# Patient Record
Sex: Female | Born: 1985 | Race: Black or African American | Hispanic: No | Marital: Single | Smoking: Never smoker
Health system: Southern US, Community
[De-identification: ages and names within clinical notes are randomized; demographics above are authoritative.]

## PROBLEM LIST (undated history)

## (undated) ENCOUNTER — Inpatient Hospital Stay (HOSPITAL_COMMUNITY): Payer: Self-pay

## (undated) DIAGNOSIS — I10 Essential (primary) hypertension: Secondary | ICD-10-CM

## (undated) DIAGNOSIS — K429 Umbilical hernia without obstruction or gangrene: Secondary | ICD-10-CM

## (undated) DIAGNOSIS — O09219 Supervision of pregnancy with history of pre-term labor, unspecified trimester: Secondary | ICD-10-CM

## (undated) HISTORY — PX: HERNIA REPAIR: SHX51

## (undated) HISTORY — DX: Supervision of pregnancy with history of pre-term labor, unspecified trimester: O09.219

---
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
## 2003-05-21 ENCOUNTER — Emergency Department (HOSPITAL_COMMUNITY): Admission: EM | Admit: 2003-05-21 | Discharge: 2003-05-21 | Payer: Self-pay | Admitting: *Deleted

## 2005-05-26 ENCOUNTER — Ambulatory Visit: Payer: Self-pay | Admitting: Obstetrics & Gynecology

## 2005-09-21 ENCOUNTER — Ambulatory Visit (HOSPITAL_BASED_OUTPATIENT_CLINIC_OR_DEPARTMENT_OTHER): Admission: RE | Admit: 2005-09-21 | Discharge: 2005-09-21 | Payer: Self-pay | Admitting: General Surgery

## 2006-08-30 ENCOUNTER — Emergency Department (HOSPITAL_COMMUNITY): Admission: EM | Admit: 2006-08-30 | Discharge: 2006-08-30 | Payer: Self-pay | Admitting: Family Medicine

## 2007-07-01 ENCOUNTER — Emergency Department (HOSPITAL_COMMUNITY): Admission: EM | Admit: 2007-07-01 | Discharge: 2007-07-01 | Payer: Self-pay | Admitting: Emergency Medicine

## 2009-10-10 ENCOUNTER — Emergency Department (HOSPITAL_COMMUNITY): Admission: EM | Admit: 2009-10-10 | Discharge: 2009-10-10 | Payer: Self-pay | Admitting: Emergency Medicine

## 2010-09-26 NOTE — Op Note (Signed)
[name], Krystal Chan                 ACCOUNT NO.:  [id number]   MEDICAL RECORD NO.:  [id number]          PATIENT TYPE:  AMB   LOCATION:  [facility]                         FACILITY:  [facility]   PHYSICIAN:  Leonie Man, M.D.   DATE OF BIRTH:  April 17, 1986   DATE OF PROCEDURE:  09/21/2005  DATE OF DISCHARGE:                                 OPERATIVE REPORT   PREOPERATIVE DIAGNOSIS:  Epigastric hernia.   POSTOPERATIVE DIAGNOSIS:  Epigastric diastasis.   PROCEDURE:  Imbrication of epigastric diastasis.   SURGEON:  Leonie Man, M.D.   ASSISTANT:  OR tech.   ANESTHESIA:  General.   SPECIMENS:  None.   ESTIMATED BLOOD LOSS:  Minimal.   COMPLICATIONS:  There were no complication.   DISPOSITION:  The patient returned to the PACU in excellent condition.   NOTE:  [name] is a 25 year old female with a bulging mass just superior to  the umbilicus.  The patient had undergone previous umbilical hernia repair  in the remote past.  She comes now with what is felt to be an epigastric  hernia for repair of same.  The risks and potential benefits of surgery have  been discussed, all questions answered and consent obtained.   PROCEDURE:  The patient was positioned supinely and following the induction  of satisfactory general anesthesia, the abdomen was prepped and draped to be  included in a sterile operative field.  A midline incision was carried down  over the region of the epigastrium just superior to the umbilicus in which  the bulging mass is felt.  This is deepened through skin and subcutaneous  tissues down to anterior midline fascia.  The fascia was very attenuated,  but there was no definite hernial defect coming through the entire thickness  of the fascia.  This defect was then imbricated with interrupted sutures of  0 Novofil, bringing the fascia and the rectus muscles together.  At the end  of this, the epigastric area was felt to be firm.  Sponge and instrument  counts were verified.   The subcutaneous tissues  were closed with a running 2-0 Vicryl suture, skin closed with a running 4-0  Monocryl suture and then reinforced with Steri-Strips, sterile dressings  applied, the anesthetic reversed and the patient removed from the operating  room to the recovery room in stable condition.  She tolerated the procedure  well.      Leonie Man, M.D.  Electronically Signed     PB/MEDQ  D:  09/21/2005  T:  09/22/2005  Job:  [id number]

## 2010-09-26 NOTE — Op Note (Signed)
NAME:  Krystal Chan, Krystal Chan                           ACCOUNT NO.:  [id number]   MEDICAL RECORD NO.:  [id number]                   PATIENT TYPE:  OIB   LOCATION:  NA                                   FACILITY:  [facility]   PHYSICIAN:  Leonie Man, M.D.                DATE OF BIRTH:  1985/08/16   DATE OF PROCEDURE:  11/09/2002  DATE OF DISCHARGE:                                 OPERATIVE REPORT   PREOPERATIVE DIAGNOSIS:  Umbilical hernia.   POSTOPERATIVE DIAGNOSIS:  Umbilical hernia.   OPERATION PERFORMED:  Repair of umbilical hernia.   SURGEON:  Leonie Man, M.D.   ASSISTANT:  Nurse.   ANESTHESIA:  General.   INDICATIONS FOR PROCEDURE:  The patient is an 25 year old obese female  recently postpartum who presents with a significant diastasis and at the  umbilicus has a very significant umbilical hernia.  The patient comes to the  operating room for repair of her umbilical hernia.  She understands that her  diastasis will not be changed by the repair of her hernia.  The risks and  benefits of umbilical hernia repair have been fully discussed, all questions  answered and consent obtained.   DESCRIPTION OF PROCEDURE:  Following the induction of satisfactory general  anesthesia, the patient was positioned supinely and the abdomen was prepped  and draped to include the umbilicus in the sterile operative field.  The  infraumbilical region was infiltrated with 0.5% Marcaine with epinephrine.  Curvilinear incision on the inferior border of the umbilicus carried down  through the skin and subcutaneous tissue raising the umbilicus as a flap and  dissecting the underlying hernia sac from off of the umbilical skin.  The  sac was dissected, carried down to the fascia.  The fascia in the midline  was extremely lax, consistent with the patient's severe diastasis.  The  hernial sac was dissected free from the fascia and the region of the  falciform ligament attached to the hernia sac was secured  with clamps and  sutures of 2-0 silk.  Because of the laxity of the abdomen, there was no  tension on the abdominal wall.  I chose to do this by primary repair using a  running 2-0 Novofil suture easily bringing the edge of the wound together in  a running suture.  the subcutaneous tissues were then closed after the  sponge, instrument and sharp counts were then verified.  This was  accomplished with a running 2-0 Vicryl suture.  The umbilical skin was then  tacked down to the base of the wound with a single 2-0 Vicryl stitch.  The  skin was then closed with running 4-0 Monocryl.  Steri-Strips were applied.  Anesthetic was reversed and the patient removed from the operating room to  the recovery room in stable condition, having tolerated the procedure well.  Leonie Man, M.D.    PB/MEDQ  D:  11/09/2002  T:  11/09/2002  Job:  [id number]

## 2010-09-26 NOTE — Discharge Summary (Signed)
[facility]  Patient:    Krystal Chan, Krystal Chan                        MRN: [id number] Adm. Date:  [id number] Disc. Date: [id number] Attending:  Antionette Char Dictator:   Solon Palm, D.O.                           Discharge Summary  HISTORY:                      The patient is a 25 year old, G1, P0, at 33-3/7 weeks admitted for monitoring and nonreassuring strip initially.  OBSTETRIC HISTORY:            Late prenatal care.  SOCIAL HISTORY:               No alcohol, no smoking, and denies substance abuse.  GYNECOLOGIC HISTORY:         Positive condyloma treated with Aldara cream. ASCUS Pap, October 2001.  PAST MEDICAL HISTORY:         None.  PAST SURGICAL HISTORY:        None.  MEDICATIONS:                  Prenatal vitamins x 1.  PRENATAL HISTORY:             GBS negative October 22. GC negative October 22. Chlamydia negative October 22. HBsAg negative. HIV negative. One-hour glucose 70. Hemoglobin/hematocrit ______ . Sickle cell negative. VDRL negative. Blood type is A positive. Rubella is immune.  INITIAL EXAMINATION:  VITAL SIGNS:                  Temperature was 99, blood pressure 111/62, pulse 98, fetal heart rate 150-155, no accelerations noted, no decelerations noted, occasional uterine contractions.  HEENT:                        Within normal limits.  CHEST:                        Clear.  CARDIOVASCULAR:               Regular rate and rhythm.  ABDOMEN:                      Soft, gravid.  EXTREMITIES:                  Pulses +2. No edema, no clonus noted.  ULTRASOUND:                   Ultrasound dated April 16, 2000: AFI was noted to be 12.8, BPP 8/8.  HOSPITAL COURSE:              The patient was admitted for continuous monitoring for a nonreassuring strip. Tocolysis was held. The patient was initially on oxygen. Fetal monitoring revealed no further worrisome fetal monitoring strips. Oxygen was weaned successfully. She  was continued on bed rest for the majority of her admission. On December 31 the patient was noted to have a normal AFI and a negative CST. Biweekly fetal monitoring was set up for the patient, to continue latent labor at home. She was discharged in stable condition and given the signs and symptoms of labor for which to return.  MEDICATIONS AT DISCHARGE:  Unchanged.  DISCHARGE FOLLOWUP:           Appointment was scheduled. DD:  07/18/00 TD:  07/18/00 Job: [id number]

## 2010-09-26 NOTE — Group Therapy Note (Signed)
NAMEJUNKO, OHAGAN NO.:  [id number]   MEDICAL RECORD NO.:  [id number]          PATIENT TYPE:  WOC   LOCATION:  [facility]                   FACILITY:  [facility]   PHYSICIAN:  Kathlyn Sacramento, M.D.   DATE OF BIRTH:  July 30, 1985   DATE OF SERVICE:                                    CLINIC NOTE   CHIEF COMPLAINT:  Nodule on cervix.   HISTORY OF PRESENT ILLNESS:  The patient is a 25 year old African-American  female, who is referred from [facility] with a nodule on her cervix.  The patient's last Pap smear done in July was normal and did have an  endocervical component present.  The patient denies any pain, or problems  otherwise.   PAST MEDICAL HISTORY:  None.   SOCIAL HISTORY:  Ethanol every other week.  One caffeinated beverage a day.  No tobacco or drug use.   MENSTRUAL HISTORY:  Age menstrual cycle began 30.  Menstrual cycles are  regular, no pain with periods.   CONTRACEPTIVE HISTORY:  Depo.   OBSTETRICAL HISTORY:  Two pregnancies, two living children, no miscarriages,  terminations, or ectopics.  Last pregnancy three years ago.  No C-sections.   GYNECOLOGIC HISTORY:  Last Pap smear was in 2006.  No mammograms.   SURGICAL HISTORY:  None.   FAMILY HISTORY:  Diabetes in paternal grandmother.  High blood pressure in  paternal grandmother.   ALLERGIES:  NONE.   MEDICATIONS:  None.   PHYSICAL EXAMINATION:  VITAL SIGNS: Temp 97.5, pulse 85, blood pressure  135/78, weight 185.7, height 5'1.  GENERAL:  Well-developed, well-nourished female in no acute distress.  GENITOURINARY EXAMINATION:  Normal external genitalia.  Vaginal mucosa is  pink and rugated.  Cervix is parous and cervical os is irregular in contour.  Otherwise, cervix in normal appearance.   IMPRESSION:  Previous cervical tear.   PLAN:  1.  The abnormal contour of the patient's cervical os is most likely due to      a previous cervical tear, which healed in an irregular fashion.  The    patient was seen by Dr. Okey Dupre, as well.  He agrees with impression and      plan.           ______________________________  Kathlyn Sacramento, M.D.     AC/MEDQ  D:  05/26/2005  T:  05/26/2005  Job:  [id number]

## 2010-09-26 NOTE — Discharge Summary (Signed)
NAME:  Krystal Chan, Krystal Chan                           ACCOUNT NO.:  [id number]   MEDICAL RECORD NO.:  [id number]                   PATIENT TYPE:  INP   LOCATION:  9133                                 [facility]ACILITY[facility]   PHYSICIAN:  Mary Sella. Orlene Erm, M.D.                 DATE OF BIRTH:  25-Feb-1986   DATE OF ADMISSION:  02/20/2002  DATE OF DISCHARGE:  02/26/2002                                 DISCHARGE SUMMARY   DISCHARGE DIAGNOSIS:  Preterm labor at 30 weeks.   DISCHARGE MEDICATIONS:  1. Terbutaline 2.5 mg one p.o. q.4-6h.  2. Prenatal vitamins one a day.   DISCHARGE INSTRUCTIONS:  She was given information on signs of preterm labor  and when to notify her doctor.  The patient was also told that she needs to  be on strict bed and pelvic rest.  She was to have no sexual activity until  delivery of the baby.   DISPOSITION:  The patient was discharged stable to home.   HISTORY OF PRESENT ILLNESS:  The patient is an 25 year old G2 P1-0-0-1 who  presented at 52 and four weeks on February 20, 2002, dated by her last  menstrual period with an estimated date of delivery of April 26, 2002.  She came in because she had an appointment at [facility] that morning at  which time they saw some contractions on the monitor and sent her here to  [facility] for an evaluation.  The patient at that time was taking  Macrobid and a prenatal vitamins.  She has no known drug allergies.  Her  pregnancy is complicated by the fact that mom is a teenager.  The patient  does have a history of a spontaneous vaginal delivery of a term baby.   HOSPITAL COURSE:  The patient was admitted for preterm contractions and  placed on magnesium sulfate, and given two doses of betamethasone for the  baby.  She was also placed on Unasyn for group B strep positive cultures and  placed on continuous electronic fetal monitoring.  During the course of the  stay the patient was placed on Procardia.  This was unsuccessful and  the  patient continued to start contracting, and the patient was placed back on  subcu terbutaline and then tried on p.o. terbutaline which helped for a  time, and then started back on subcu terbutaline, and finally at discharge  was doing well on p.o. terbutaline and strict bedrest.  At discharge the  patient had a fetal heart rate of 150s to 160s with good accelerations.  The  patient was still on 2.5 mg of terbutaline q.4h. with an occasional  contraction every one-half to one hour.  The patient is to continue with her  p.o. terbutaline at home and reschedule the appointment that she missed  during her admission for the end of this week or next week.  The patient  may  need to be transferred to high risk clinic.  We also discussed the  importance of bedrest.  The patient should also continue her Macrobid.      Nani Gasser, M.D.                  Mary Sella. Orlene Erm, M.D.    CM/MEDQ  D:  02/26/2002  T:  02/27/2002  Job:  [id number]

## 2010-09-26 NOTE — Discharge Summary (Signed)
[facility]  Patient:    Krystal Chan, Krystal Chan                        MRN: [id number] Adm. Date:  [id number] Disc. Date: 04/23/00 Attending:  Michaelle Copas Dictator:   Jamey Reas, M.D.                           Discharge Summary  DATE OF BIRTH:                02-May-1986  ADMITTING DIAGNOSES:          Nonreassuring fetal heart tones.  DISCHARGE DIAGNOSES:          Nonreassuring fetal heart tones, normal AFI, normal biophysical profile, normal Doppler flow, normal fetal growth, positive contraction stress test.  SERVICE:                      OB teaching service.  CONSULTS:                     None.  PROCEDURE:                    Obstetric ultrasound x 4, biophysical profile with AFI x 3, Doppler study on April 20, 2000, AFI on April 23, 2000, contraction stress test on April 19, 2000 which was positive.  HOSPITAL COURSE:              #13 - A 25 year old G1, P0 at 48 3/7 on admission, 34 and 3 at discharge.  Was admitted secondary to having deep variable decelerations x 3 on admission.  Patient was here for repeat ultrasound for questionable oligo.  Some contractions were noted.  She was then transferred to maternity admissions.  Deep variable decelerations were then noted with monitoring.  She was admitted to antenatal for continuous monitoring.  Patient remained on intermittent continuous monitoring throughout.  She was eventually switched to mother baby with Q-shift monitoring.  Patient had occasional contractions with occasional variables throughout her hospital course.  She had numerous ultrasound which were found to be within normal limits with normal BPP, normal Doppler flow, normal growth, and normal AFI.  Patient had a contraction stress test on April 19, 2000 which was positive.  She had late decelerations with each contraction. She was therefore kept for further monitoring.  Patient has remained on bed rest  throughout hospitalization.  She has had no other complaints other than fetal monitoring.  She has noted that her fetus has moved normally throughout hospitalization.  CONDITION ON DISCHARGE:       Stable.  DISPOSITION:                  Discharged to home.  MEDICATIONS:                  Prenatal vitamins one p.o. q.d.  INSTRUCTIONS:                 Bed rest with only bathroom and shower privileges.  Patient will follow up with [facility] antenatal testing for repeat NST on Monday, April 26, 2000.  She will be put on twice weekly monitoring with twice weekly NSTs and once weekly BPP and AFI.  Patient will have the remainder of her care through maternity admissions. DD:  04/23/00 TD:  04/23/00 Job: [id number]

## 2010-09-26 NOTE — Discharge Summary (Signed)
[facility]  Patient:    Krystal Chan, Krystal Chan                        MRN: [id number] Adm. Date:  [id number] Disc. Date: [id number] Attending:  Michaelle Copas Dictator:   Zella Ball, M.D.                           Discharge Summary  DISCHARGE DIAGNOSIS:          Pyelonephritis in pregnancy.  PRESENTING HISTORY:           The patient is a 25 year old G1, with an estimated gestational age of eight weeks by last menstrual period, who presented with a 1-1/2 week history of burning and frequency with urination. She presented to the MAU on November 15, 1999 and at that time was given oral antibiotic for a bladder infection, but continued to have pain.  The night before presentation, the patient developed back and side pain and nausea and vomiting, and she represents to the MAU now.  The patients medical and surgical history were unremarkable.  SOCIAL HISTORY:               The patient denied use of tobacco, alcohol, or drugs.  PHYSICAL EXAMINATION:  VITAL SIGNS:                  The patient was noted to have a temperature of 100.8 and a pulse of 107, blood pressure 129/64.  ABDOMEN:                      On physical exam, the patient was noted to have right CVA tenderness.  LABORATORY DATA:              The patient had a catheterized UA which showed moderate blood, some protein, positive nitrites, and large leukocyte esterase, as well as too numerous to count WBCs.  ADMISSION DIAGNOSIS:          The patient was admitted with a diagnosis of pyelonephritis in pregnancy.  HOSPITAL COURSE:              The patient was begun on IV Cefotan 2 grams q.8h. IV.  The patient did well clinically and was kept on the IV Cefotan until she was 48 hours afebrile.  The patient was then changed to oral Ceftin 250 mg b.i.d.  Also while in house, the patient had a social work consult due to patients status as teen pregnancy.  The patient recovered  without complications.  DISPOSITION:                  The patient was discharged to home in good health.  DISCHARGE MEDICATIONS:        Ceftin 250 mg b.i.d. for 11 days.  FOLLOW-UP:                    The patient was made a follow-up appointment at the high risk clinic on August 2 at 8:30 a.m. DD:  01/04/00 TD:  01/05/00 Job: [id number]

## 2011-01-30 LAB — POCT URINALYSIS DIP (DEVICE)
Ketones, ur: NEGATIVE
Nitrite: NEGATIVE
Operator id: 239701
pH: 7

## 2011-01-30 LAB — POCT PREGNANCY, URINE: Operator id: 116391

## 2011-05-01 ENCOUNTER — Encounter: Payer: Self-pay | Admitting: Obstetrics & Gynecology

## 2011-07-22 ENCOUNTER — Encounter: Payer: Self-pay | Admitting: Obstetrics & Gynecology

## 2012-08-24 ENCOUNTER — Encounter (HOSPITAL_COMMUNITY): Payer: Self-pay | Admitting: Emergency Medicine

## 2012-08-24 ENCOUNTER — Emergency Department (HOSPITAL_COMMUNITY): Payer: Self-pay

## 2012-08-24 ENCOUNTER — Emergency Department (HOSPITAL_COMMUNITY)
Admission: EM | Admit: 2012-08-24 | Discharge: 2012-08-24 | Disposition: A | Discharge status: Home / Self Care | Payer: No Typology Code available for payment source | Attending: Emergency Medicine | Admitting: Emergency Medicine

## 2012-08-24 ENCOUNTER — Emergency Department (HOSPITAL_COMMUNITY): Payer: No Typology Code available for payment source

## 2012-08-24 DIAGNOSIS — M549 Dorsalgia, unspecified: Secondary | ICD-10-CM | POA: Insufficient documentation

## 2012-08-24 DIAGNOSIS — Y9389 Activity, other specified: Secondary | ICD-10-CM | POA: Insufficient documentation

## 2012-08-24 DIAGNOSIS — Y92410 Unspecified street and highway as the place of occurrence of the external cause: Secondary | ICD-10-CM | POA: Insufficient documentation

## 2012-08-24 DIAGNOSIS — S134XXA Sprain of ligaments of cervical spine, initial encounter: Secondary | ICD-10-CM

## 2012-08-24 DIAGNOSIS — M25549 Pain in joints of unspecified hand: Secondary | ICD-10-CM | POA: Insufficient documentation

## 2012-08-24 DIAGNOSIS — R55 Syncope and collapse: Secondary | ICD-10-CM | POA: Insufficient documentation

## 2012-08-24 DIAGNOSIS — S139XXA Sprain of joints and ligaments of unspecified parts of neck, initial encounter: Secondary | ICD-10-CM | POA: Insufficient documentation

## 2012-08-24 DIAGNOSIS — R51 Headache: Secondary | ICD-10-CM | POA: Insufficient documentation

## 2012-08-24 MED ORDER — CYCLOBENZAPRINE HCL 5 MG PO TABS: 5.0000 mg | ORAL_TABLET | Freq: Three times a day (TID) | ORAL | Status: DC | PRN

## 2012-08-24 MED ORDER — HYDROCODONE-ACETAMINOPHEN 5-325 MG PO TABS
2.0000 | ORAL_TABLET | Freq: Once | ORAL | Status: AC
  Administered 2012-08-24: 2 via ORAL
  Filled 2012-08-24: qty 2

## 2012-08-24 MED ORDER — HYDROCODONE-ACETAMINOPHEN 5-325 MG PO TABS: 2.0000 | ORAL_TABLET | Freq: Four times a day (QID) | ORAL | Status: DC | PRN

## 2012-08-24 NOTE — ED Notes (Signed)
Patient transported to X-ray 

## 2012-08-24 NOTE — ED Provider Notes (Signed)
I saw  the patient, reviewed the resident's note and I agree with the findings and plan.   .Face to face Exam:  General:  Awake HEENT:  Atraumatic Resp:  Normal effort Abd:  Nondistended Neuro:No focal weakness    Nelia Shi, MD 08/24/12 2242
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
## 2012-09-25 ENCOUNTER — Encounter (HOSPITAL_COMMUNITY): Payer: Self-pay | Admitting: *Deleted

## 2012-09-25 ENCOUNTER — Inpatient Hospital Stay (HOSPITAL_COMMUNITY)
Admission: AD | Admit: 2012-09-25 | Discharge: 2012-09-25 | Disposition: A | Discharge status: Home / Self Care | Payer: Self-pay | Source: Ambulatory Visit | Attending: Obstetrics & Gynecology | Admitting: Obstetrics & Gynecology

## 2012-09-25 DIAGNOSIS — O219 Vomiting of pregnancy, unspecified: Secondary | ICD-10-CM

## 2012-09-25 DIAGNOSIS — O210 Mild hyperemesis gravidarum: Secondary | ICD-10-CM | POA: Insufficient documentation

## 2012-09-25 LAB — CBC
HCT: 37.3 % (ref 36.0–46.0)
Hemoglobin: 12.3 g/dL (ref 12.0–15.0)
MCH: 29.9 pg (ref 26.0–34.0)
MCHC: 33 g/dL (ref 30.0–36.0)
MCV: 90.8 fL (ref 78.0–100.0)
Platelets: 217 10*3/uL (ref 150–400)
RBC: 4.11 MIL/uL (ref 3.87–5.11)
RDW: 13.8 % (ref 11.5–15.5)
WBC: 13.3 10*3/uL — ABNORMAL HIGH (ref 4.0–10.5)

## 2012-09-25 LAB — URINALYSIS, ROUTINE W REFLEX MICROSCOPIC
Nitrite: NEGATIVE
Specific Gravity, Urine: >1.03 — ABNORMAL HIGH (ref 1.005–1.030)
Urobilinogen, UA: 1 mg/dL (ref 0.0–1.0)
pH: 6 (ref 5.0–8.0)

## 2012-09-25 LAB — URINE MICROSCOPIC-ADD ON

## 2012-09-25 MED ORDER — ONDANSETRON HCL 4 MG PO TABS: 4.0000 mg | ORAL_TABLET | Freq: Four times a day (QID) | ORAL | Status: DC

## 2012-09-25 MED ORDER — SODIUM CHLORIDE 0.9 % IV SOLN
25.0000 mg | Freq: Once | INTRAVENOUS | Status: AC
  Administered 2012-09-25: 25 mg via INTRAVENOUS
  Filled 2012-09-25: qty 1

## 2012-09-25 MED ORDER — PROMETHAZINE HCL 25 MG PO TABS: 25.0000 mg | ORAL_TABLET | Freq: Four times a day (QID) | ORAL | Status: DC | PRN

## 2012-09-25 NOTE — MAU Provider Note (Signed)
History     CSN: [id number]  Arrival date & time 09/25/12  1826   None     Chief Complaint  Patient presents with  . Emesis During Pregnancy    (Consider location/radiation/quality/duration/timing/severity/associated sxs/prior treatment) HPI Krystal Chan is a 26 y.o. G3P2002 at 6 2/7 wks. She c/o vomiting daily since found out was pregnant on 5/8. She estimates vomits ~ 3x/d, last meal kept down was today at 4pm.  She has been having night sweats x 1 wk also. No bleeding or spotting, no change in discharge, odor or itching. Has sl low abd cramping off/on.  Past Medical History  Diagnosis Date  . Medical history non-contributory     Past Surgical History  Procedure Laterality Date  . Hernia repair      No family history on file.  History  Substance Use Topics  . Smoking status: Never Smoker   . Smokeless tobacco: Not on file  . Alcohol Use: No     Comment: ocassionally    OB History   Grav Para Term Preterm Abortions TAB SAB Ect Mult Living   3 2 2       2       Review of Systems  Constitutional: Positive for chills. Negative for fever.  Gastrointestinal: Positive for nausea and vomiting. Negative for diarrhea and constipation.  Genitourinary: Negative for dysuria, urgency, frequency, vaginal bleeding and vaginal discharge.       Occ cramping    Allergies  Review of patient's allergies indicates no known allergies.  Home Medications  No current outpatient prescriptions on file.  BP 144/90  Pulse 80  Resp 18  Ht 5\' 1"  (1.549 m)  Wt 213 lb 6.4 oz (96.798 kg)  BMI 40.34 kg/m2  LMP 08/13/2012  Physical Exam  Constitutional: She is oriented to person, place, and time. She appears well-developed and well-nourished.  Abdominal: Soft. She exhibits no distension. There is no tenderness. There is no rebound and no guarding.  Musculoskeletal: Normal range of motion.  Neurological: She is alert and oriented to person, place, and time.  Skin: Skin is warm and dry.   Psychiatric: She has a normal mood and affect. Her behavior is normal.    ED Course  Procedures (including critical care time) 2100- pt nausea better, going to try crackers and peanut butter. Care to Joseph Berkshire, PA Labs Reviewed  URINALYSIS, ROUTINE W REFLEX MICROSCOPIC - Abnormal; Notable for the following:    Specific Gravity, Urine >1.030 (*)    Hgb urine dipstick TRACE (*)    Ketones, ur 15 (*)    Leukocytes, UA SMALL (*)    All other components within normal limits  URINE MICROSCOPIC-ADD ON - Abnormal; Notable for the following:    Squamous Epithelial / LPF FEW (*)    Bacteria, UA FEW (*)    All other components within normal limits  POCT PREGNANCY, URINE - Abnormal; Notable for the following:    Preg Test, Ur POSITIVE (*)    All other components within normal limits  URINE CULTURE  CBC   No results found.   No diagnosis found.    MDM   2100 - Care assumed from Eveline Keto, NP Patient receiving IV fluids. Patient reports significant improvement in symptoms and requests attempt at PO intake. Patient given crackers and water.   MDM Patient able to tolerate PO.  A: Nausea and vomiting in early pregnancy  P: Discharge home Rx for Phenergan and Zofran sent to patient's pharmacy Patient encouraged  to take Tylenol PRN for pain AVS contains information on hyperemesis diet. Patient encouraged to increase PO hydration as tolerated.  Patient encouraged to keep follow-up appointment with [facility] as scheduled Patient may return to MAU as needed

## 2012-09-25 NOTE — MAU Note (Signed)
Pt found out she was pregnant on 5/8 at the health dept. Has been having nigh sweats and not able to keep anything down for the past week.

## 2012-09-27 ENCOUNTER — Telehealth: Payer: Self-pay | Admitting: Obstetrics & Gynecology

## 2012-09-27 LAB — URINE CULTURE: Colony Count: >=100000

## 2012-09-27 MED ORDER — CEPHALEXIN 500 MG PO CAPS: 500.0000 mg | ORAL_CAPSULE | Freq: Three times a day (TID) | ORAL | Status: DC

## 2012-09-27 NOTE — Telephone Encounter (Signed)
+  UTI  Keflex 500 TID x 7 e prescribed

## 2013-07-31 ENCOUNTER — Encounter (HOSPITAL_COMMUNITY): Payer: Self-pay | Admitting: *Deleted

## 2014-03-12 ENCOUNTER — Encounter (HOSPITAL_COMMUNITY): Payer: Self-pay | Admitting: *Deleted

## 2015-01-14 ENCOUNTER — Inpatient Hospital Stay (HOSPITAL_COMMUNITY): Payer: Medicaid Other

## 2015-01-14 ENCOUNTER — Encounter (HOSPITAL_COMMUNITY): Payer: Self-pay | Admitting: *Deleted

## 2015-01-14 ENCOUNTER — Inpatient Hospital Stay (HOSPITAL_COMMUNITY)
Admission: AD | Admit: 2015-01-14 | Discharge: 2015-01-14 | Disposition: A | Discharge status: Home / Self Care | Payer: Medicaid Other | Source: Ambulatory Visit | Attending: Obstetrics & Gynecology | Admitting: Obstetrics & Gynecology

## 2015-01-14 DIAGNOSIS — O26891 Other specified pregnancy related conditions, first trimester: Secondary | ICD-10-CM | POA: Insufficient documentation

## 2015-01-14 DIAGNOSIS — Z3A08 8 weeks gestation of pregnancy: Secondary | ICD-10-CM | POA: Insufficient documentation

## 2015-01-14 DIAGNOSIS — O9989 Other specified diseases and conditions complicating pregnancy, childbirth and the puerperium: Secondary | ICD-10-CM | POA: Diagnosis not present

## 2015-01-14 DIAGNOSIS — R109 Unspecified abdominal pain: Secondary | ICD-10-CM | POA: Diagnosis not present

## 2015-01-14 DIAGNOSIS — O26899 Other specified pregnancy related conditions, unspecified trimester: Secondary | ICD-10-CM

## 2015-01-14 LAB — CBC
HEMATOCRIT: 37.4 % (ref 36.0–46.0)
Hemoglobin: 12.4 g/dL (ref 12.0–15.0)
MCH: 30.4 pg (ref 26.0–34.0)
MCHC: 33.2 g/dL (ref 30.0–36.0)
MCV: 91.7 fL (ref 78.0–100.0)
PLATELETS: 232 10*3/uL (ref 150–400)
RBC: 4.08 MIL/uL (ref 3.87–5.11)
RDW: 14.4 % (ref 11.5–15.5)
WBC: 11.2 10*3/uL — AB (ref 4.0–10.5)

## 2015-01-14 LAB — HCG, QUANTITATIVE, PREGNANCY: HCG, BETA CHAIN, QUANT, S: 158991 m[IU]/mL — AB (ref <5)

## 2015-01-14 LAB — URINALYSIS, ROUTINE W REFLEX MICROSCOPIC
BILIRUBIN URINE: NEGATIVE
Glucose, UA: NEGATIVE mg/dL
Hgb urine dipstick: NEGATIVE
KETONES UR: 15 mg/dL — AB
LEUKOCYTES UA: NEGATIVE
NITRITE: NEGATIVE
Protein, ur: NEGATIVE mg/dL
SPECIFIC GRAVITY, URINE: 1.025 (ref 1.005–1.030)
UROBILINOGEN UA: 0.2 mg/dL (ref 0.0–1.0)
pH: 6 (ref 5.0–8.0)

## 2015-01-14 LAB — WET PREP, GENITAL
Clue Cells Wet Prep HPF POC: NONE SEEN
Trich, Wet Prep: NONE SEEN
Yeast Wet Prep HPF POC: NONE SEEN

## 2015-01-14 LAB — ABO/RH: ABO/RH(D): A POS

## 2015-01-14 LAB — OB RESULTS CONSOLE GC/CHLAMYDIA: Gonorrhea: NEGATIVE

## 2015-01-14 LAB — POCT PREGNANCY, URINE: PREG TEST UR: POSITIVE — AB

## 2015-01-14 NOTE — MAU Provider Note (Signed)
History     CSN: [id number]  Arrival date and time: 01/14/15 1437   First Provider Initiated Contact with Patient 01/14/15 1605      Chief Complaint  Patient presents with  . Abdominal Pain  . Possible Pregnancy   HPI  [name] is a 29 y.o. female [phone] at [redacted]w[redacted]d presenting with abdominal pain. She recently found out she is pregnant. The pain is located in the bottom of her stomach, on both sides. The pain comes and goes and feels like cramping.   She currently rates her pain 1/10 She denies vaginal bleeding or discharge.     OB History    Gravida Para Term Preterm AB TAB SAB Ectopic Multiple Living   6 2 2  2 2    2       Past Medical History  Diagnosis Date  . Medical history non-contributory     Past Surgical History  Procedure Laterality Date  . Hernia repair      History reviewed. No pertinent family history.  Social History  Substance Use Topics  . Smoking status: Never Smoker   . Smokeless tobacco: None  . Alcohol Use: No     Comment: ocassionally    Allergies: No Known Allergies  Prescriptions prior to admission  Medication Sig Dispense Refill Last Dose  . cephALEXin (KEFLEX) 500 MG capsule Take 1 capsule (500 mg total) by mouth 3 (three) times daily. 21 capsule 0   . ondansetron (ZOFRAN) 4 MG tablet Take 1 tablet (4 mg total) by mouth every 6 (six) hours. 12 tablet 0   . Prenatal Vit-Fe Fumarate-FA (PRENATAL MULTIVITAMIN) TABS Take 1 tablet by mouth every evening.   09/24/2012 at Unknown  . promethazine (PHENERGAN) 25 MG tablet Take 1 tablet (25 mg total) by mouth every 6 (six) hours as needed for nausea. 30 tablet 0    Results for orders placed or performed during the hospital encounter of 01/14/15 (from the past 48 hour(s))  Urinalysis, Routine w reflex microscopic (not at [facility])     Status: Abnormal   Collection Time: 01/14/15  3:00 PM  Result Value Ref Range   Color, Urine YELLOW YELLOW   APPearance CLEAR CLEAR   Specific Gravity, Urine  1.025 1.005 - 1.030   pH 6.0 5.0 - 8.0   Glucose, UA NEGATIVE NEGATIVE mg/dL   Hgb urine dipstick NEGATIVE NEGATIVE   Bilirubin Urine NEGATIVE NEGATIVE   Ketones, ur 15 (A) NEGATIVE mg/dL   Protein, ur NEGATIVE NEGATIVE mg/dL   Urobilinogen, UA 0.2 0.0 - 1.0 mg/dL   Nitrite NEGATIVE NEGATIVE   Leukocytes, UA NEGATIVE NEGATIVE    Comment: MICROSCOPIC NOT DONE ON URINES WITH NEGATIVE PROTEIN, BLOOD, LEUKOCYTES, NITRITE, OR GLUCOSE <1000 mg/dL.  Pregnancy, urine POC     Status: Abnormal   Collection Time: 01/14/15  3:15 PM  Result Value Ref Range   Preg Test, Ur POSITIVE (A) NEGATIVE    Comment:        THE SENSITIVITY OF THIS METHODOLOGY IS >24 mIU/mL   Wet prep, genital     Status: Abnormal   Collection Time: 01/14/15  4:30 PM  Result Value Ref Range   Yeast Wet Prep HPF POC NONE SEEN NONE SEEN   Trich, Wet Prep NONE SEEN NONE SEEN   Clue Cells Wet Prep HPF POC NONE SEEN NONE SEEN   WBC, Wet Prep HPF POC FEW (A) NONE SEEN    Comment: FEW BACTERIA SEEN  CBC     Status: Abnormal  Collection Time: 01/14/15  4:43 PM  Result Value Ref Range   WBC 11.2 (H) 4.0 - 10.5 K/uL   RBC 4.08 3.87 - 5.11 MIL/uL   Hemoglobin 12.4 12.0 - 15.0 g/dL   HCT 16.1 09.6 - 04.5 %   MCV 91.7 78.0 - 100.0 fL   MCH 30.4 26.0 - 34.0 pg   MCHC 33.2 30.0 - 36.0 g/dL   RDW 40.9 81.1 - 91.4 %   Platelets 232 150 - 400 K/uL  ABO/Rh     Status: None   Collection Time: 01/14/15  4:43 PM  Result Value Ref Range   ABO/RH(D) A POS   hCG, quantitative, pregnancy     Status: Abnormal   Collection Time: 01/14/15  4:43 PM  Result Value Ref Range   hCG, Beta Chain, Quant, S 158991 (H) <5 mIU/mL    Comment:          GEST. AGE      CONC.  (mIU/mL)   <=1 WEEK        5 - 50     2 WEEKS       50 - 500     3 WEEKS       100 - 10,000     4 WEEKS     1,000 - 30,000     5 WEEKS     3,500 - 115,000   6-8 WEEKS     12,000 - 270,000    12 WEEKS     15,000 - 220,000        FEMALE AND NON-PREGNANT FEMALE:     LESS THAN  5 mIU/mL    US Ob Comp Less 14 Wks  01/14/2015   CLINICAL DATA:  Lower abdominal pain for 3 days. Estimated gestational age by last menstrual period equals 8 weeks 3 days.  EXAM: OBSTETRIC <14 WK [name] AND TRANSVAGINAL OB US  TECHNIQUE: Both transabdominal and transvaginal ultrasound examinations were performed for complete evaluation of the gestation as well as the maternal uterus, adnexal regions, and pelvic cul-de-sac. Transvaginal technique was performed to assess early pregnancy.  COMPARISON:  None.  FINDINGS: Intrauterine gestational sac: Single  Yolk sac:  Not present  Embryo:  Present  Cardiac Activity: Present  Heart Rate: 175  bpm  CRL:  24  mm   9 w   1 d                  [name] EDC: 08/18/2015  Maternal uterus/adnexae: There is distortion of the gestational sac by a rounded echogenicity within the myometrium. After discussion with sonographer, this is felt to represent the normal myometrium with potential contraction rather than a subchorionic hemorrhage. Normal ovaries. No free fluid.  IMPRESSION: 1. Single intrauterine gestation with embryo and normal cardiac activity.  2. Estimated gestational age by crown rump length equals 9 weeks 1 day.   Electronically Signed   By: Genevive Bi M.D.   On: 01/14/2015 18:30     Review of Systems  Constitutional: Negative for fever.  Gastrointestinal: Positive for nausea. Negative for vomiting.  Genitourinary: Negative for dysuria.   Physical Exam   Blood pressure 136/71, pulse 67, temperature 98.2 F (36.8 C), temperature source Oral, resp. rate 18, height 5' 4.25" (1.632 m), weight 88.814 kg (195 lb 12.8 oz), last menstrual period 11/16/2014, unknown if currently breastfeeding.  Physical Exam  Constitutional: She is oriented to person, place, and time. She appears well-developed and well-nourished. No distress.  HENT:  Head: Normocephalic.  Eyes: Pupils are equal, round, and reactive to light.  Neck: Neck supple.  Respiratory: Effort normal.   GI: Soft. She exhibits no distension. There is no tenderness. There is no rebound.  Genitourinary:  Speculum exam: Vagina - Larage amount of clear fluid, no odor- patient recently had intercourse.  Cervix - No contact bleeding GC/Chlam, wet prep done Chaperone present for exam.  Musculoskeletal: Normal range of motion.  Neurological: She is alert and oriented to person, place, and time.  Skin: Skin is warm. She is not diaphoretic.  Psychiatric: Her behavior is normal.    MAU Course  Procedures  None  MDM  A positive blood type   Assessment and Plan   A:  1. Abdominal pain in pregnancy, antepartum   2. Abdominal pain in pregnancy, antepartum    P:  Discharge home in stable condition Return to MAU if symptoms worsen Pregnancy verification letter given Start prenatal care ASAP   Duane Lope, NP 01/14/2015 4:06 PM

## 2015-01-14 NOTE — Discharge Instructions (Signed)

## 2015-01-14 NOTE — MAU Note (Signed)
+  HPT x2 (8/28 and 8/31). Has been having some pain in lower abd- started on Sat.

## 2015-01-15 LAB — HIV ANTIBODY (ROUTINE TESTING W REFLEX): HIV SCREEN 4TH GENERATION: NONREACTIVE

## 2015-01-15 LAB — GC/CHLAMYDIA PROBE AMP (~~LOC~~) NOT AT ARMC
CHLAMYDIA, DNA PROBE: NEGATIVE
Neisseria Gonorrhea: NEGATIVE

## 2015-02-11 ENCOUNTER — Other Ambulatory Visit (HOSPITAL_COMMUNITY): Payer: Self-pay | Admitting: Nurse Practitioner

## 2015-02-11 DIAGNOSIS — Z3A13 13 weeks gestation of pregnancy: Secondary | ICD-10-CM

## 2015-02-11 DIAGNOSIS — Z3682 Encounter for antenatal screening for nuchal translucency: Secondary | ICD-10-CM

## 2015-02-11 LAB — OB RESULTS CONSOLE ABO/RH: RH TYPE: POSITIVE

## 2015-02-11 LAB — OB RESULTS CONSOLE VARICELLA ZOSTER ANTIBODY, IGG: Varicella: IMMUNE

## 2015-02-11 LAB — OB RESULTS CONSOLE HEPATITIS B SURFACE ANTIGEN: Hepatitis B Surface Ag: NEGATIVE

## 2015-02-11 LAB — OB RESULTS CONSOLE RPR: RPR: NONREACTIVE

## 2015-02-11 LAB — OB RESULTS CONSOLE RUBELLA ANTIBODY, IGM: RUBELLA: IMMUNE

## 2015-02-11 LAB — OB RESULTS CONSOLE ANTIBODY SCREEN: ANTIBODY SCREEN: NEGATIVE

## 2015-02-21 ENCOUNTER — Ambulatory Visit (HOSPITAL_COMMUNITY): Admission: RE | Admit: 2015-02-21 | Payer: Medicaid Other | Source: Ambulatory Visit

## 2015-02-21 ENCOUNTER — Ambulatory Visit (HOSPITAL_COMMUNITY)
Admission: RE | Admit: 2015-02-21 | Discharge: 2015-02-21 | Disposition: A | Discharge status: Home / Self Care | Payer: Medicaid Other | Source: Ambulatory Visit | Attending: Nurse Practitioner | Admitting: Nurse Practitioner

## 2015-02-21 ENCOUNTER — Encounter (HOSPITAL_COMMUNITY): Payer: Self-pay

## 2015-02-21 ENCOUNTER — Other Ambulatory Visit (HOSPITAL_COMMUNITY): Payer: Self-pay | Admitting: Nurse Practitioner

## 2015-02-21 VITALS — BP 127/73 | HR 70 | Wt 202.0 lb

## 2015-02-21 DIAGNOSIS — Z3689 Encounter for other specified antenatal screening: Secondary | ICD-10-CM

## 2015-02-21 DIAGNOSIS — Z36 Encounter for antenatal screening of mother: Secondary | ICD-10-CM | POA: Diagnosis not present

## 2015-02-21 DIAGNOSIS — O09212 Supervision of pregnancy with history of pre-term labor, second trimester: Secondary | ICD-10-CM | POA: Diagnosis not present

## 2015-02-21 DIAGNOSIS — Z3A13 13 weeks gestation of pregnancy: Secondary | ICD-10-CM

## 2015-02-21 DIAGNOSIS — Z3682 Encounter for antenatal screening for nuchal translucency: Secondary | ICD-10-CM

## 2015-02-21 DIAGNOSIS — Z3A14 14 weeks gestation of pregnancy: Secondary | ICD-10-CM

## 2015-02-21 DIAGNOSIS — O09892 Supervision of other high risk pregnancies, second trimester: Secondary | ICD-10-CM

## 2015-02-28 ENCOUNTER — Ambulatory Visit (INDEPENDENT_AMBULATORY_CARE_PROVIDER_SITE_OTHER): Payer: Self-pay | Admitting: Family Medicine

## 2015-02-28 ENCOUNTER — Encounter: Payer: Self-pay | Admitting: Family Medicine

## 2015-02-28 VITALS — BP 113/67 | HR 67 | Temp 97.7°F | Wt 202.2 lb

## 2015-02-28 DIAGNOSIS — O0990 Supervision of high risk pregnancy, unspecified, unspecified trimester: Secondary | ICD-10-CM | POA: Insufficient documentation

## 2015-02-28 DIAGNOSIS — O09212 Supervision of pregnancy with history of pre-term labor, second trimester: Secondary | ICD-10-CM

## 2015-02-28 DIAGNOSIS — O09899 Supervision of other high risk pregnancies, unspecified trimester: Secondary | ICD-10-CM

## 2015-02-28 DIAGNOSIS — O09219 Supervision of pregnancy with history of pre-term labor, unspecified trimester: Secondary | ICD-10-CM

## 2015-02-28 DIAGNOSIS — O09892 Supervision of other high risk pregnancies, second trimester: Secondary | ICD-10-CM

## 2015-02-28 DIAGNOSIS — O0992 Supervision of high risk pregnancy, unspecified, second trimester: Secondary | ICD-10-CM

## 2015-02-28 HISTORY — DX: Supervision of other high risk pregnancies, unspecified trimester: O09.899

## 2015-02-28 LAB — POCT URINALYSIS DIP (DEVICE)
BILIRUBIN URINE: NEGATIVE
Glucose, UA: NEGATIVE mg/dL
KETONES UR: NEGATIVE mg/dL
Leukocytes, UA: NEGATIVE
Nitrite: NEGATIVE
PH: 7 (ref 5.0–8.0)
Protein, ur: NEGATIVE mg/dL
Specific Gravity, Urine: 1.02 (ref 1.005–1.030)
Urobilinogen, UA: 1 mg/dL (ref 0.0–1.0)

## 2015-02-28 NOTE — Progress Notes (Signed)
Subjective:  Krystal Chan is a 29 y.o. [id number] at 8349w6d being seen today for initial prenatal care.  Patient had previous preterm delivery at 6773w3d, which was her last pregnancy.  FOB involved.  Patient reports no complaints.  Contractions: Not present.  Vag. Bleeding: None. Movement: Present. Denies leaking of fluid.   The following portions of the patient's history were reviewed and updated as appropriate: allergies, current medications, past family history, past medical history, past social history, past surgical history and problem list. Problem list updated.  Objective:   Filed Vitals:   02/28/15 0801  BP: 113/67  Pulse: 67  Temp: 97.7 F (36.5 C)  Weight: 202 lb 3.2 oz (91.717 kg)    Fetal Status: Fetal Heart Rate (bpm): 134   Movement: Present     General:  Alert, oriented and cooperative. Patient is in no acute distress.  Skin: Skin is warm and dry. No rash noted.   Cardiovascular: Normal heart rate, no murmurs  Respiratory: Normal respiratory effort, no problems with respiration noted  Abdomen: Soft, gravid, appropriate for gestational age. Pain/Pressure: Absent     Pelvic: Vag. Bleeding: None Vag D/C Character: Other (Comment)   Cervical exam deferred        Extremities: Normal range of motion.  Edema: Trace  Mental Status: Normal mood and affect. Normal behavior. Normal judgment and thought content.   Urinalysis:      Assessment and Plan:  Pregnancy: [id number] at 5349w6d  1. Supervision of high risk pregnancy, antepartum, second trimester Patient desires Quad screen.  Fetal survey scheduled  2. History of preterm delivery, currently pregnant, second trimester Discussed makena.  Paperwork filled out.  Preterm labor symptoms and general obstetric precautions including but not limited to vaginal bleeding, contractions, leaking of fluid and fetal movement were reviewed in detail with the patient. Please refer to After Visit Summary for other counseling recommendations.  No  Follow-up on file.   Levie HeritageJacob J Stinson, DO

## 2015-02-28 NOTE — Patient Instructions (Signed)

## 2015-03-05 ENCOUNTER — Encounter: Payer: Self-pay | Admitting: *Deleted

## 2015-03-11 ENCOUNTER — Encounter (HOSPITAL_COMMUNITY): Payer: Self-pay | Admitting: *Deleted

## 2015-03-11 ENCOUNTER — Inpatient Hospital Stay (HOSPITAL_COMMUNITY)
Admission: AD | Admit: 2015-03-11 | Discharge: 2015-03-11 | Disposition: A | Discharge status: Home / Self Care | Payer: Medicaid Other | Source: Ambulatory Visit | Attending: Family Medicine | Admitting: Family Medicine

## 2015-03-11 DIAGNOSIS — Z3A16 16 weeks gestation of pregnancy: Secondary | ICD-10-CM | POA: Diagnosis not present

## 2015-03-11 DIAGNOSIS — O368190 Decreased fetal movements, unspecified trimester, not applicable or unspecified: Secondary | ICD-10-CM | POA: Diagnosis not present

## 2015-03-11 DIAGNOSIS — O368120 Decreased fetal movements, second trimester, not applicable or unspecified: Secondary | ICD-10-CM | POA: Insufficient documentation

## 2015-03-11 DIAGNOSIS — O26892 Other specified pregnancy related conditions, second trimester: Secondary | ICD-10-CM | POA: Diagnosis present

## 2015-03-11 NOTE — MAU Note (Signed)
Pt states that she has not felt movement since yesterday-states that she normally feels fluttering. Denies pain or vaginal bleeding.

## 2015-03-11 NOTE — MAU Provider Note (Signed)
  History     CSN: [id number]  Arrival date and time: 03/11/15 2218   First Provider Initiated Contact with Patient 03/11/15 2310      No chief complaint on file.  HPIpt is 16w3 weeks pregnant [id number] being seen in [facility] clinic for hx of preterm delivery at 36 weeks Pt has appointment on 11/11 US and then doctor appointment on 11/17. Pt is concerned because she has started to feel the baby moving and has not felt any movement since yesterday. Pt denies spotting/bleeding/abd pain, constipation or diarrhea or UTI sx. RN note:    Expand All Collapse All   Pt states that she has not felt movement since yesterday-states that she normally feels fluttering. Denies pain or vaginal bleeding.            Past Medical History  Diagnosis Date  . Medical history non-contributory     Past Surgical History  Procedure Laterality Date  . Hernia repair      gastic hernia 2005 and umbilical hernia 2004    Family History  Problem Relation Age of Onset  . Breast cancer Maternal Grandmother   . Hypertension Maternal Grandmother   . Cancer Maternal Grandmother   . Diabetes Maternal Grandfather   . Hypertension Mother   . Heart disease Mother   . Hypertension Paternal Grandmother   . Diabetes Paternal Grandmother     Social History  Substance Use Topics  . Smoking status: Never Smoker   . Smokeless tobacco: None  . Alcohol Use: No     Comment: ocassionally    Allergies: No Known Allergies  Prescriptions prior to admission  Medication Sig Dispense Refill Last Dose  . Prenatal Vit-Fe Fumarate-FA (PRENATAL MULTIVITAMIN) TABS Take 2 tablets by mouth every evening.    03/11/2015 at Unknown time    Review of Systems  Constitutional: Negative for fever and chills.  Gastrointestinal: Negative for nausea, vomiting, abdominal pain, diarrhea and constipation.  Genitourinary: Negative for dysuria and urgency.  Neurological: Negative for headaches.   Physical Exam   Blood pressure  140/80, pulse 82, temperature 98.8 F (37.1 C), temperature source Oral, resp. rate 18, height 5' 1.5" (1.562 m), weight 209 lb (94.802 kg), last menstrual period 11/16/2014, SpO2 100 %, unknown if currently breastfeeding.  Physical Exam  Nursing note and vitals reviewed. Constitutional: She is oriented to person, place, and time. She appears well-developed and well-nourished.  HENT:  Head: Normocephalic.  Eyes: Pupils are equal, round, and reactive to light.  Neck: Normal range of motion. Neck supple.  Cardiovascular: Normal rate.   Respiratory: Effort normal.  GI: Soft. She exhibits no distension. There is no tenderness. There is no rebound.  Musculoskeletal: Normal range of motion.  Neurological: She is alert and oriented to person, place, and time.  Skin: Skin is warm and dry.  Psychiatric: She has a normal mood and affect.    MAU Course  Procedures No results found for this or any previous visit (from the past 24 hour(s)). FHT 152 bpm with doppler- pt relieved Discussed with pt that she may have episodes of not feeling movement at this point in her pregnancy Assessment and Plan  Decreased fetal movement- +FHT Watch BP Keep appointment with clinic as scheduled Return for any concerns or sx  Makyle Eslick 03/11/2015, 11:10 PM

## 2015-03-21 ENCOUNTER — Other Ambulatory Visit (HOSPITAL_COMMUNITY): Payer: Self-pay | Admitting: Maternal and Fetal Medicine

## 2015-03-21 ENCOUNTER — Ambulatory Visit (HOSPITAL_COMMUNITY)
Admission: RE | Admit: 2015-03-21 | Discharge: 2015-03-21 | Disposition: A | Discharge status: Home / Self Care | Payer: Medicaid Other | Source: Ambulatory Visit | Attending: Family | Admitting: Family

## 2015-03-21 VITALS — BP 117/72 | HR 79 | Wt 208.0 lb

## 2015-03-21 DIAGNOSIS — Z3A18 18 weeks gestation of pregnancy: Secondary | ICD-10-CM | POA: Insufficient documentation

## 2015-03-21 DIAGNOSIS — Z36 Encounter for antenatal screening of mother: Secondary | ICD-10-CM | POA: Diagnosis not present

## 2015-03-21 DIAGNOSIS — O09219 Supervision of pregnancy with history of pre-term labor, unspecified trimester: Secondary | ICD-10-CM | POA: Insufficient documentation

## 2015-03-21 DIAGNOSIS — O99212 Obesity complicating pregnancy, second trimester: Secondary | ICD-10-CM | POA: Insufficient documentation

## 2015-03-21 DIAGNOSIS — Z3689 Encounter for other specified antenatal screening: Secondary | ICD-10-CM

## 2015-03-21 DIAGNOSIS — O0990 Supervision of high risk pregnancy, unspecified, unspecified trimester: Secondary | ICD-10-CM

## 2015-03-28 ENCOUNTER — Ambulatory Visit (INDEPENDENT_AMBULATORY_CARE_PROVIDER_SITE_OTHER): Payer: Medicaid Other | Admitting: Family

## 2015-03-28 VITALS — BP 118/78 | HR 71 | Wt 206.1 lb

## 2015-03-28 DIAGNOSIS — O0992 Supervision of high risk pregnancy, unspecified, second trimester: Secondary | ICD-10-CM

## 2015-03-28 DIAGNOSIS — O09212 Supervision of pregnancy with history of pre-term labor, second trimester: Secondary | ICD-10-CM

## 2015-03-28 DIAGNOSIS — O09892 Supervision of other high risk pregnancies, second trimester: Secondary | ICD-10-CM

## 2015-03-28 LAB — POCT URINALYSIS DIP (DEVICE)
BILIRUBIN URINE: NEGATIVE
Glucose, UA: NEGATIVE mg/dL
HGB URINE DIPSTICK: NEGATIVE
Ketones, ur: 40 mg/dL — AB
LEUKOCYTES UA: NEGATIVE
NITRITE: NEGATIVE
PH: 7 (ref 5.0–8.0)
Protein, ur: NEGATIVE mg/dL
SPECIFIC GRAVITY, URINE: 1.02 (ref 1.005–1.030)
UROBILINOGEN UA: 2 mg/dL — AB (ref 0.0–1.0)

## 2015-03-28 NOTE — Progress Notes (Signed)
Subjective:  Krystal Chan is a 29 y.o. [id number] at 2498w6d being seen today for ongoing prenatal care.  Patient reports no complaints.  Contractions: Not present.  Vag. Bleeding: None. Movement: Absent. Denies leaking of fluid.   The following portions of the patient's history were reviewed and updated as appropriate: allergies, current medications, past family history, past medical history, past social history, past surgical history and problem list. Problem list updated.  Objective:   Filed Vitals:   03/28/15 0941  BP: 118/78  Pulse: 71  Weight: 206 lb 1.6 oz (93.486 kg)    Fetal Status: Fetal Heart Rate (bpm): 136 Fundal Height: 21 cm Movement: Absent     General:  Alert, oriented and cooperative. Patient is in no acute distress.  Skin: Skin is warm and dry. No rash noted.   Cardiovascular: Normal heart rate noted  Respiratory: Normal respiratory effort, no problems with respiration noted  Abdomen: Soft, gravid, appropriate for gestational age. Pain/Pressure: Absent     Pelvic: Vag. Bleeding: None     Cervical exam deferred        Extremities: Normal range of motion.  Edema: Trace  Mental Status: Normal mood and affect. Normal behavior. Normal judgment and thought content.   Urinalysis:     Urine results not available at discharge.     Assessment and Plan:  Pregnancy: [id number] at 2298w6d  1. Supervision of high risk pregnancy, antepartum, second trimester - AFP, Quad Screen  2. History of Preterm Delivery - Beginning 17p on Tuesday  Preterm labor symptoms and general obstetric precautions including but not limited to vaginal bleeding, contractions, leaking of fluid and fetal movement were reviewed in detail with the patient. Please refer to After Visit Summary for other counseling recommendations.  Return in about 1 week (around 04/04/2015) for weekly for 17 p and 3 wks for appt.   Eino FarberWalidah Kennith GainN Karim, CNM

## 2015-04-01 ENCOUNTER — Telehealth: Payer: Self-pay | Admitting: *Deleted

## 2015-04-01 NOTE — Telephone Encounter (Signed)
Called pt @ 1410 and informed her that her 17P medication has arrived.  Pt confirmed that she will keep scheduled appt on 11/23 @ 1530 for initial injection.

## 2015-04-03 ENCOUNTER — Ambulatory Visit (INDEPENDENT_AMBULATORY_CARE_PROVIDER_SITE_OTHER): Payer: Medicaid Other

## 2015-04-03 VITALS — BP 124/64 | HR 73 | Wt 207.3 lb

## 2015-04-03 DIAGNOSIS — O09892 Supervision of other high risk pregnancies, second trimester: Secondary | ICD-10-CM

## 2015-04-03 DIAGNOSIS — O09212 Supervision of pregnancy with history of pre-term labor, second trimester: Secondary | ICD-10-CM

## 2015-04-03 MED ORDER — HYDROXYPROGESTERONE CAPROATE 250 MG/ML IM OIL
250.0000 mg | TOPICAL_OIL | INTRAMUSCULAR | Status: AC
  Administered 2015-04-03 – 2015-07-25 (×16): 250 mg via INTRAMUSCULAR

## 2015-04-08 LAB — AFP, QUAD SCREEN
AFP: 62.3 ng/mL
Curr Gest Age: 19.4 wks.days
HCG TOTAL: 52.94 [IU]/mL
INH: 245.8 pg/mL
INTERPRETATION-AFP: NEGATIVE
MOM FOR AFP: 1.27
MoM for INH: 1.63
MoM for hCG: 2.8
OPEN SPINA BIFIDA: NEGATIVE
Osb Risk: 1:11100 {titer}
TRI 18 SCR RISK EST: NEGATIVE
UE3 VALUE: 1.79 ng/mL
uE3 Mom: 1.13

## 2015-04-11 ENCOUNTER — Ambulatory Visit: Payer: Medicaid Other | Admitting: *Deleted

## 2015-04-11 ENCOUNTER — Ambulatory Visit: Payer: Medicaid Other

## 2015-04-11 VITALS — BP 116/62 | HR 76 | Temp 98.8°F | Wt 210.4 lb

## 2015-04-11 DIAGNOSIS — Z8751 Personal history of pre-term labor: Secondary | ICD-10-CM

## 2015-04-18 ENCOUNTER — Other Ambulatory Visit (HOSPITAL_COMMUNITY)
Admission: RE | Admit: 2015-04-18 | Discharge: 2015-04-18 | Disposition: A | Discharge status: Home / Self Care | Payer: Medicaid Other | Source: Ambulatory Visit | Attending: Family | Admitting: Family

## 2015-04-18 ENCOUNTER — Ambulatory Visit: Payer: Medicaid Other

## 2015-04-18 ENCOUNTER — Ambulatory Visit (INDEPENDENT_AMBULATORY_CARE_PROVIDER_SITE_OTHER): Payer: Medicaid Other | Admitting: Family

## 2015-04-18 VITALS — BP 129/76 | HR 79 | Temp 98.5°F | Wt 209.8 lb

## 2015-04-18 DIAGNOSIS — Z1151 Encounter for screening for human papillomavirus (HPV): Secondary | ICD-10-CM | POA: Diagnosis present

## 2015-04-18 DIAGNOSIS — O0992 Supervision of high risk pregnancy, unspecified, second trimester: Secondary | ICD-10-CM

## 2015-04-18 DIAGNOSIS — Z01419 Encounter for gynecological examination (general) (routine) without abnormal findings: Secondary | ICD-10-CM | POA: Diagnosis present

## 2015-04-18 DIAGNOSIS — O09212 Supervision of pregnancy with history of pre-term labor, second trimester: Secondary | ICD-10-CM | POA: Diagnosis not present

## 2015-04-18 DIAGNOSIS — O09892 Supervision of other high risk pregnancies, second trimester: Secondary | ICD-10-CM

## 2015-04-18 LAB — POCT URINALYSIS DIP (DEVICE)
Bilirubin Urine: NEGATIVE
GLUCOSE, UA: NEGATIVE mg/dL
HGB URINE DIPSTICK: NEGATIVE
Ketones, ur: NEGATIVE mg/dL
Leukocytes, UA: NEGATIVE
Nitrite: NEGATIVE
PH: 7 (ref 5.0–8.0)
PROTEIN: NEGATIVE mg/dL
SPECIFIC GRAVITY, URINE: 1.025 (ref 1.005–1.030)
UROBILINOGEN UA: 1 mg/dL (ref 0.0–1.0)

## 2015-04-18 NOTE — Progress Notes (Signed)
Subjective:  Krystal Chan is a 29 y.o. [id number] at 3452w6d being seen today for ongoing prenatal care.  She is currently monitored for the following issues for this high-risk pregnancy and has Supervision of high risk pregnancy, antepartum and History of preterm delivery, currently pregnant on her problem list.  Patient reports pedal swelling while standing at work.  Contractions: Not present.  .  Movement: Present. Denies leaking of fluid.   The following portions of the patient's history were reviewed and updated as appropriate: allergies, current medications, past family history, past medical history, past social history, past surgical history and problem list. Problem list updated.  Objective:   Filed Vitals:   04/18/15 1140  BP: 129/76  Pulse: 79  Temp: 98.5 F (36.9 C)  Weight: 209 lb 12.8 oz (95.165 kg)    Fetal Status:   Fundal Height: 23 cm Movement: Present     General:  Alert, oriented and cooperative. Patient is in no acute distress.  Skin: Skin is warm and dry. No rash noted.   Cardiovascular: Normal heart rate noted  Respiratory: Normal respiratory effort, no problems with respiration noted  Abdomen: Soft, gravid, appropriate for gestational age. Pain/Pressure: Absent     Pelvic:       Cervical exam deferred        Extremities: Normal range of motion.  Edema: Trace  Mental Status: Normal mood and affect. Normal behavior. Normal judgment and thought content.   Urinalysis: Urine Protein: Negative Urine Glucose: Negative  Assessment and Plan:  Pregnancy: [id number] at 3152w6d  1. Supervision of high risk pregnancy, antepartum, second trimester - Cytology - PAP  2. History of Preterm Labor - 17p today  Preterm labor symptoms and general obstetric precautions including but not limited to vaginal bleeding, contractions, leaking of fluid and fetal movement were reviewed in detail with the patient. Please refer to After Visit Summary for other counseling recommendations.   Return in 1 wk 17p about 3 week appt (around 05/09/2015).   Eino FarberWalidah Kennith GainN Karim, CNM

## 2015-04-18 NOTE — Patient Instructions (Signed)

## 2015-04-22 ENCOUNTER — Inpatient Hospital Stay (HOSPITAL_COMMUNITY)
Admission: AD | Admit: 2015-04-22 | Discharge: 2015-04-22 | Disposition: A | Discharge status: Home / Self Care | Payer: Medicaid Other | Source: Ambulatory Visit | Attending: Family Medicine | Admitting: Family Medicine

## 2015-04-22 ENCOUNTER — Encounter (HOSPITAL_COMMUNITY): Payer: Self-pay | Admitting: *Deleted

## 2015-04-22 DIAGNOSIS — Z3A22 22 weeks gestation of pregnancy: Secondary | ICD-10-CM | POA: Insufficient documentation

## 2015-04-22 DIAGNOSIS — G44209 Tension-type headache, unspecified, not intractable: Secondary | ICD-10-CM

## 2015-04-22 DIAGNOSIS — O26892 Other specified pregnancy related conditions, second trimester: Secondary | ICD-10-CM | POA: Insufficient documentation

## 2015-04-22 DIAGNOSIS — O9989 Other specified diseases and conditions complicating pregnancy, childbirth and the puerperium: Secondary | ICD-10-CM

## 2015-04-22 DIAGNOSIS — R51 Headache: Secondary | ICD-10-CM | POA: Diagnosis not present

## 2015-04-22 DIAGNOSIS — R42 Dizziness and giddiness: Secondary | ICD-10-CM | POA: Insufficient documentation

## 2015-04-22 LAB — URINE MICROSCOPIC-ADD ON

## 2015-04-22 LAB — URINALYSIS, ROUTINE W REFLEX MICROSCOPIC
Bilirubin Urine: NEGATIVE
Glucose, UA: NEGATIVE mg/dL
Hgb urine dipstick: NEGATIVE
Ketones, ur: NEGATIVE mg/dL
Leukocytes, UA: NEGATIVE
NITRITE: NEGATIVE
PROTEIN: 30 mg/dL — AB
Specific Gravity, Urine: 1.025 (ref 1.005–1.030)
pH: 8.5 — ABNORMAL HIGH (ref 5.0–8.0)

## 2015-04-22 LAB — CBC
HCT: 35 % — ABNORMAL LOW (ref 36.0–46.0)
HEMOGLOBIN: 11.7 g/dL — AB (ref 12.0–15.0)
MCH: 31.3 pg (ref 26.0–34.0)
MCHC: 33.4 g/dL (ref 30.0–36.0)
MCV: 93.6 fL (ref 78.0–100.0)
PLATELETS: 194 10*3/uL (ref 150–400)
RBC: 3.74 MIL/uL — ABNORMAL LOW (ref 3.87–5.11)
RDW: 13.9 % (ref 11.5–15.5)
WBC: 10.9 10*3/uL — ABNORMAL HIGH (ref 4.0–10.5)

## 2015-04-22 LAB — BASIC METABOLIC PANEL
Anion gap: 9 (ref 5–15)
BUN: 6 mg/dL (ref 6–20)
CHLORIDE: 104 mmol/L (ref 101–111)
CO2: 24 mmol/L (ref 22–32)
CREATININE: 0.59 mg/dL (ref 0.44–1.00)
Calcium: 9 mg/dL (ref 8.9–10.3)
GFR calc Af Amer: >60 mL/min (ref >60)
GFR calc non Af Amer: >60 mL/min (ref >60)
Glucose, Bld: 87 mg/dL (ref 65–99)
Potassium: 3.8 mmol/L (ref 3.5–5.1)
SODIUM: 137 mmol/L (ref 135–145)

## 2015-04-22 LAB — TSH: TSH: 0.757 u[IU]/mL (ref 0.350–4.500)

## 2015-04-22 LAB — CYTOLOGY - PAP

## 2015-04-22 NOTE — Discharge Instructions (Signed)
Dizziness °Dizziness is a common problem. It makes you feel unsteady or lightheaded. You may feel like you are about to pass out (faint). Dizziness can lead to injury if you stumble or fall. Anyone can get dizzy, but dizziness is more common in older adults. This condition can be caused by a number of things, including: °· Medicines. °· Dehydration. °· Illness. °HOME CARE °Following these instructions may help with your condition: °Eating and Drinking °· Drink enough fluid to keep your pee (urine) clear or pale yellow. This helps to keep you from getting dehydrated. Try to drink more clear fluids, such as water. °· Do not drink alcohol. °· Limit how much caffeine you drink or eat if told by your doctor. °· Limit how much salt you drink or eat if told by your doctor. °Activity °· Avoid making quick movements. °¨ When you stand up from sitting in a chair, steady yourself until you feel okay. °¨ In the morning, first sit up on the side of the bed. When you feel okay, stand slowly while you hold onto something. Do this until you know that your balance is fine. °· Move your legs often if you need to stand in one place for a long time. Tighten and relax your muscles in your legs while you are standing. °· Do not drive or use heavy machinery if you feel dizzy. °· Avoid bending down if you feel dizzy. Place items in your home so that they are easy for you to reach without leaning over. °Lifestyle °· Do not use any tobacco products, including cigarettes, chewing tobacco, or electronic cigarettes. If you need help quitting, ask your doctor. °· Try to lower your stress level, such as with yoga or meditation. Talk with your doctor if you need help. °General Instructions °· Watch your dizziness for any changes. °· Take medicines only as told by your doctor. Talk with your doctor if you think that your dizziness is caused by a medicine that you are taking. °· Tell a friend or a family member that you are feeling dizzy. If he or  she notices any changes in your behavior, have this person call your doctor. °· Keep all follow-up visits as told by your doctor. This is important. °GET HELP IF: °· Your dizziness does not go away. °· Your dizziness or light-headedness gets worse. °· You feel sick to your stomach (nauseous). °· You have trouble hearing. °· You have new symptoms. °· You are unsteady on your feet or you feel like the room is spinning. °GET HELP RIGHT AWAY IF: °· You throw up (vomit) or have diarrhea and are unable to eat or drink anything. °· You have trouble: °¨ Talking. °¨ Walking. °¨ Swallowing. °¨ Using your arms, hands, or legs. °· You feel generally weak. °· You are not thinking clearly or you have trouble forming sentences. It may take a friend or family member to notice this. °· You have: °¨ Chest pain. °¨ Pain in your belly (abdomen). °¨ Shortness of breath. °¨ Sweating. °· Your vision changes. °· You are bleeding. °· You have a headache. °· You have neck pain or a stiff neck. °· You have a fever. °  °This information is not intended to replace advice given to you by your health care provider. Make sure you discuss any questions you have with your health care provider. °  °Document Released: 04/16/2011 Document Revised: 09/11/2014 Document Reviewed: 04/23/2014 °[employer]vier Interactive Patient Education ©2016 [employer] ° °

## 2015-04-22 NOTE — MAU Provider Note (Signed)
MAU HISTORY AND PHYSICAL  Chief Complaint:  Dizziness and Headache   Krystal Chan is a 29 y.o.  [id number] with IUP at 5640w3d presenting for Dizziness and Headache  This weekend moving house, did relatively light work. Developed headache on Saturday, tylenol helped, but headache persisted. Last night felt a little dizzy. This morning while waiting for ride to work developed mild nausea. When arrived at work felt weak, "in a daze." Headache persists, bilateral frontal, no weakness or numbness or difficulty speaking or eating. No fevers or chills, no dysuria or hematuria. No contractions, no vaginal bleeding, no leakage of fluid. No palpitations. No difficulty breathing or shortness of breath. No calf swelling or pain. Cites recent psychosocial stressors that led to her recent move. Peeing normally. Next appt next Thursday.   Past Medical History  Diagnosis Date  . Medical history non-contributory     Past Surgical History  Procedure Laterality Date  . Hernia repair      gastic hernia 2005 and umbilical hernia 2004    Family History  Problem Relation Age of Onset  . Breast cancer Maternal Grandmother   . Hypertension Maternal Grandmother   . Cancer Maternal Grandmother   . Diabetes Maternal Grandfather   . Hypertension Mother   . Heart disease Mother   . Hypertension Paternal Grandmother   . Diabetes Paternal Grandmother   . Hearing loss Neg Hx     Social History  Substance Use Topics  . Smoking status: Never Smoker   . Smokeless tobacco: Never Used  . Alcohol Use: No     Comment: ocassionally    No Known Allergies  Facility-administered medications prior to admission  Medication Dose Route Frequency Provider Last Rate Last Dose  . hydroxyprogesterone caproate (DELALUTIN) 250 mg/mL injection 250 mg  250 mg Intramuscular Weekly Reva Boresanya S Pratt, MD   250 mg at 04/18/15 1144   Prescriptions prior to admission  Medication Sig Dispense Refill Last Dose  . Acetaminophen (TYLENOL  PO) Take 1 tablet by mouth every 6 (six) hours as needed (headache).   04/21/2015 at Unknown time  . Prenatal Vit-Min-FA-Fish Oil ([employer] PRENATAL GUMMY PO) Take 2 tablets by mouth daily.   04/22/2015 at Unknown time    Review of Systems - Negative except for what is mentioned in HPI.  Physical Exam  Blood pressure 118/72, pulse 73, temperature 98.7 F (37.1 C), temperature source Oral, resp. rate 18, last menstrual period 11/16/2014, SpO2 100 %, unknown if currently breastfeeding. GENERAL: Well-developed, well-nourished female in no acute distress.  LUNGS: Clear to auscultation bilaterally.  HEART: Regular rate and rhythm. ABDOMEN: Soft, nontender, nondistended, gravid.  EXTREMITIES: Nontender, no edema, 2+ distal pulses. No calf swelling/tenderness Neuro: cn 2-12 grossly intact, 5/5 upper and lower strength, normal finger to nose, distal sensation to light touch intact, normal speech FHT:  152   Labs: Results for orders placed or performed during the hospital encounter of 04/22/15 (from the past 24 hour(s))  Urinalysis, Routine w reflex microscopic (not [facility])   Collection Time: 04/22/15  9:38 AM  Result Value Ref Range   Color, Urine YELLOW YELLOW   APPearance CLEAR CLEAR   Specific Gravity, Urine 1.025 1.005 - 1.030   pH 8.5 (H) 5.0 - 8.0   Glucose, UA NEGATIVE NEGATIVE mg/dL   Hgb urine dipstick NEGATIVE NEGATIVE   Bilirubin Urine NEGATIVE NEGATIVE   Ketones, ur NEGATIVE NEGATIVE mg/dL   Protein, ur 30 (A) NEGATIVE mg/dL   Nitrite NEGATIVE NEGATIVE   Leukocytes, UA  NEGATIVE NEGATIVE  Urine microscopic-add on   Collection Time: 04/22/15  9:38 AM  Result Value Ref Range   Squamous Epithelial / LPF 0-5 (A) NONE SEEN   WBC, UA 0-5 0 - 5 WBC/hpf   RBC / HPF 0-5 0 - 5 RBC/hpf   Bacteria, UA MANY (A) NONE SEEN   Urine-Other MUCOUS PRESENT   CBC   Collection Time: 04/22/15 10:44 AM  Result Value Ref Range   WBC 10.9 (H) 4.0 - 10.5 K/uL   RBC 3.74 (L) 3.87 - 5.11 MIL/uL    Hemoglobin 11.7 (L) 12.0 - 15.0 g/dL   HCT 02.7 (L) 25.3 - 66.4 %   MCV 93.6 78.0 - 100.0 fL   MCH 31.3 26.0 - 34.0 pg   MCHC 33.4 30.0 - 36.0 g/dL   RDW 40.3 47.4 - 25.9 %   Platelets 194 150 - 400 K/uL  Basic metabolic panel   Collection Time: 04/22/15 10:44 AM  Result Value Ref Range   Sodium 137 135 - 145 mmol/L   Potassium 3.8 3.5 - 5.1 mmol/L   Chloride 104 101 - 111 mmol/L   CO2 24 22 - 32 mmol/L   Glucose, Bld 87 65 - 99 mg/dL   BUN 6 6 - 20 mg/dL   Creatinine, Ser 5.63 0.44 - 1.00 mg/dL   Calcium 9.0 8.9 - 87.5 mg/dL   GFR calc non Af Amer >60 >60 mL/min   GFR calc Af Amer >60 >60 mL/min   Anion gap 9 5 - 15    Imaging Studies:  No results found.  Assessment: Krystal Chan is  29 y.o. I4P3295 at [redacted]w[redacted]d presents with dizziness and mild headache. Well-appearing, normal physical exam, normal cranial nerves, headache improved spontaneously while here. Orthostatics negative, not anemic, no metabolic abnormalities seen on BMP. No elevated BP to suggest preeclampsia. TSH is pending but I doubt it is significantly elevated. Patient cites number of recent psychosocial stressors that may contribute.  Plan: - f/u TSH - stroke, preeclampsia, syncope return precautions - OB f/u later this week as scheduled. - maintain good hydration  [name] [name] 12/12/201611:52 AM

## 2015-04-22 NOTE — MAU Note (Signed)
All weekend was having issues with being dizzy and having a headache.  Took some Tylenol, didn't really help.  Got up this morning, took vitamin, ate breakfast - went to work.  Started feeling queasy at work, was breathing fast, people at work said she wasn't really responding to them. EMS did check her sugar- told her it was ok.

## 2015-04-25 ENCOUNTER — Ambulatory Visit (INDEPENDENT_AMBULATORY_CARE_PROVIDER_SITE_OTHER): Payer: Medicaid Other | Admitting: General Practice

## 2015-04-25 VITALS — BP 126/66 | HR 83 | Temp 98.4°F | Ht 61.0 in | Wt 209.0 lb

## 2015-04-25 DIAGNOSIS — O09892 Supervision of other high risk pregnancies, second trimester: Secondary | ICD-10-CM

## 2015-04-25 DIAGNOSIS — O09212 Supervision of pregnancy with history of pre-term labor, second trimester: Secondary | ICD-10-CM | POA: Diagnosis present

## 2015-05-02 ENCOUNTER — Ambulatory Visit (INDEPENDENT_AMBULATORY_CARE_PROVIDER_SITE_OTHER): Payer: Medicaid Other

## 2015-05-02 VITALS — BP 115/63 | HR 73 | Temp 98.3°F | Wt 211.0 lb

## 2015-05-02 DIAGNOSIS — O09212 Supervision of pregnancy with history of pre-term labor, second trimester: Secondary | ICD-10-CM

## 2015-05-02 DIAGNOSIS — O09892 Supervision of other high risk pregnancies, second trimester: Secondary | ICD-10-CM

## 2015-05-02 NOTE — Progress Notes (Signed)
Pt came in today for [name] injection. Pt tolerated well she will returned in a week.

## 2015-05-09 ENCOUNTER — Ambulatory Visit (INDEPENDENT_AMBULATORY_CARE_PROVIDER_SITE_OTHER): Payer: Medicaid Other | Admitting: Family Medicine

## 2015-05-09 VITALS — BP 133/72 | HR 68 | Temp 98.1°F | Wt 211.6 lb

## 2015-05-09 DIAGNOSIS — O09212 Supervision of pregnancy with history of pre-term labor, second trimester: Secondary | ICD-10-CM

## 2015-05-09 DIAGNOSIS — O0992 Supervision of high risk pregnancy, unspecified, second trimester: Secondary | ICD-10-CM

## 2015-05-09 DIAGNOSIS — O09892 Supervision of other high risk pregnancies, second trimester: Secondary | ICD-10-CM

## 2015-05-09 LAB — POCT URINALYSIS DIP (DEVICE)
Bilirubin Urine: NEGATIVE
GLUCOSE, UA: NEGATIVE mg/dL
KETONES UR: NEGATIVE mg/dL
Nitrite: NEGATIVE
PH: 6 (ref 5.0–8.0)
PROTEIN: NEGATIVE mg/dL
SPECIFIC GRAVITY, URINE: 1.025 (ref 1.005–1.030)
UROBILINOGEN UA: 1 mg/dL (ref 0.0–1.0)

## 2015-05-09 NOTE — Progress Notes (Signed)
Pain reported from injection site mostly on left side; pt has requested to use right side until further notice.

## 2015-05-09 NOTE — Progress Notes (Signed)
Subjective:  Krystal Chan is a 29 y.o. [id number] at [redacted]w[redacted]d being seen today for ongoing prenatal care.  She is currently monitored for the following issues for this high-risk pregnancy and has Supervision of high risk pregnancy, antepartum and History of preterm delivery, currently pregnant on her problem list.  Patient reports hip pain.  Contractions: Not present. Vag. Bleeding: None.  Movement: Present. Denies leaking of fluid.   The following portions of the patient's history were reviewed and updated as appropriate: allergies, current medications, past family history, past medical history, past social history, past surgical history and problem list. Problem list updated.  Objective:   Filed Vitals:   05/09/15 0904  BP: 133/72  Pulse: 68  Temp: 98.1 F (36.7 C)  Weight: 211 lb 9.6 oz (95.981 kg)    Fetal Status: Fetal Heart Rate (bpm): 141   Movement: Present     General:  Alert, oriented and cooperative. Patient is in no acute distress.  Skin: Skin is warm and dry. No rash noted.   Cardiovascular: Normal heart rate noted  Respiratory: Normal respiratory effort, no problems with respiration noted  Abdomen: Soft, gravid, appropriate for gestational age. Pain/Pressure: Absent     Pelvic: Vag. Bleeding: None     Cervical exam deferred        Extremities: Normal range of motion.  Edema: None  Mental Status: Normal mood and affect. Normal behavior. Normal judgment and thought content.   Urinalysis:      Assessment and Plan:  Pregnancy: [id number] at [redacted]w[redacted]d  1. History of preterm delivery, currently pregnant, second trimester 17 P today  2. Supervision of high risk pregnancy, antepartum, second trimester Updated box  Provided info about IUDs Patient has growth US scheduled in 4 weeks.    Preterm labor symptoms and general obstetric precautions including but not limited to vaginal bleeding, contractions, leaking of fluid and fetal movement were reviewed in detail with the  patient. Please refer to After Visit Summary for other counseling recommendations.  Return in about 4 weeks (around 06/06/2015) for Routine prenatal care.   Federico FlakeKimberly Niles Hannahgrace Lalli, MD

## 2015-05-09 NOTE — Patient Instructions (Addendum)

## 2015-05-12 NOTE — L&D Delivery Note (Signed)
Operative Delivery Note At 8:29 PM a viable female was delivered via .  Presentation: vertex; Position: Right,, Occiput,, Anterior; Station: +2.  Verbal consent: obtained from patient fpr a vacuum assisted delivery due to non reassuring fetal heart tracing (recurrent late decelerations).  Risks and benefits discussed in detail.  Risks include, but are not limited to the risks of anesthesia, bleeding, infection, damage to maternal tissues, fetal cephalhematoma.  There is also the risk of inability to effect vaginal delivery of the head, or shoulder dystocia that cannot be resolved by established maneuvers, leading to the need for emergency cesarean section.  APGAR: 7, 8 .   Placenta status: Intact, Spontaneous.   Cord: 3-vessel cord with the following complications: None.    Anesthesia: Epidural  Instruments: [place] Episiotomy:  none Lacerations:  none Est. Blood Loss (mL): 100 cc    Mom to postpartum.  Baby to Couplet care / Skin to Skin.  Krystal Chan 08/09/2015, 8:37 PM

## 2015-05-16 ENCOUNTER — Other Ambulatory Visit (HOSPITAL_COMMUNITY): Payer: Self-pay | Admitting: Maternal and Fetal Medicine

## 2015-05-16 ENCOUNTER — Ambulatory Visit (HOSPITAL_COMMUNITY)
Admission: RE | Admit: 2015-05-16 | Discharge: 2015-05-16 | Disposition: A | Discharge status: Home / Self Care | Payer: Medicaid Other | Source: Ambulatory Visit | Attending: Nurse Practitioner | Admitting: Nurse Practitioner

## 2015-05-16 ENCOUNTER — Ambulatory Visit (INDEPENDENT_AMBULATORY_CARE_PROVIDER_SITE_OTHER): Payer: Medicaid Other | Admitting: General Practice

## 2015-05-16 VITALS — BP 117/67 | HR 79 | Temp 98.3°F | Ht 61.0 in | Wt 211.0 lb

## 2015-05-16 DIAGNOSIS — Z3A26 26 weeks gestation of pregnancy: Secondary | ICD-10-CM | POA: Diagnosis not present

## 2015-05-16 DIAGNOSIS — O09212 Supervision of pregnancy with history of pre-term labor, second trimester: Secondary | ICD-10-CM

## 2015-05-16 DIAGNOSIS — O09892 Supervision of other high risk pregnancies, second trimester: Secondary | ICD-10-CM

## 2015-05-16 DIAGNOSIS — O99212 Obesity complicating pregnancy, second trimester: Secondary | ICD-10-CM

## 2015-05-16 DIAGNOSIS — Z3A25 25 weeks gestation of pregnancy: Secondary | ICD-10-CM

## 2015-05-16 DIAGNOSIS — O99213 Obesity complicating pregnancy, third trimester: Secondary | ICD-10-CM | POA: Diagnosis not present

## 2015-05-16 DIAGNOSIS — O0990 Supervision of high risk pregnancy, unspecified, unspecified trimester: Secondary | ICD-10-CM

## 2015-05-23 ENCOUNTER — Ambulatory Visit (INDEPENDENT_AMBULATORY_CARE_PROVIDER_SITE_OTHER): Payer: Medicaid Other | Admitting: *Deleted

## 2015-05-23 VITALS — BP 122/69

## 2015-05-23 DIAGNOSIS — O09212 Supervision of pregnancy with history of pre-term labor, second trimester: Secondary | ICD-10-CM

## 2015-05-23 DIAGNOSIS — Z8751 Personal history of pre-term labor: Secondary | ICD-10-CM

## 2015-05-29 ENCOUNTER — Telehealth: Payer: Self-pay | Admitting: *Deleted

## 2015-05-29 NOTE — Telephone Encounter (Signed)
Krystal Chan left a message today stating she is about 27 weeks pregant and is calling about leakage. May call her at her mobile number or work number [phone].

## 2015-05-29 NOTE — Telephone Encounter (Signed)
Called Krystal Chan at her mobile number and left a message I am returning her call. Please call clinic before 4 when we close, if after 4 and is urgent go to MAU . If not call us in am.  Also called her work number and left a message on her manager voice mail that we are trying to reach [place] - please have her call clinic by 4 , if after 4 and urgent go to MAU. If not urgent and after 4, call us in am.

## 2015-05-30 ENCOUNTER — Encounter: Payer: Self-pay | Admitting: *Deleted

## 2015-05-30 ENCOUNTER — Ambulatory Visit (INDEPENDENT_AMBULATORY_CARE_PROVIDER_SITE_OTHER): Payer: Medicaid Other | Admitting: *Deleted

## 2015-05-30 VITALS — BP 123/74 | HR 88 | Temp 98.9°F | Wt 214.0 lb

## 2015-05-30 DIAGNOSIS — O09212 Supervision of pregnancy with history of pre-term labor, second trimester: Secondary | ICD-10-CM

## 2015-05-30 DIAGNOSIS — Z8751 Personal history of pre-term labor: Secondary | ICD-10-CM

## 2015-05-30 NOTE — Telephone Encounter (Signed)
Attempted to call patient again. There was no answer. Voice mail left stating we are trying to return her call, please call back at the clinic.

## 2015-06-06 ENCOUNTER — Ambulatory Visit (INDEPENDENT_AMBULATORY_CARE_PROVIDER_SITE_OTHER): Payer: Medicaid Other | Admitting: Family Medicine

## 2015-06-06 VITALS — BP 116/70 | HR 85 | Wt 212.0 lb

## 2015-06-06 DIAGNOSIS — Z23 Encounter for immunization: Secondary | ICD-10-CM | POA: Diagnosis not present

## 2015-06-06 DIAGNOSIS — O09893 Supervision of other high risk pregnancies, third trimester: Secondary | ICD-10-CM

## 2015-06-06 DIAGNOSIS — O09213 Supervision of pregnancy with history of pre-term labor, third trimester: Secondary | ICD-10-CM

## 2015-06-06 DIAGNOSIS — O0993 Supervision of high risk pregnancy, unspecified, third trimester: Secondary | ICD-10-CM

## 2015-06-06 LAB — POCT URINALYSIS DIP (DEVICE)
Glucose, UA: NEGATIVE mg/dL
HGB URINE DIPSTICK: NEGATIVE
KETONES UR: NEGATIVE mg/dL
Leukocytes, UA: NEGATIVE
Nitrite: NEGATIVE
PH: 7 (ref 5.0–8.0)
PROTEIN: 30 mg/dL — AB
SPECIFIC GRAVITY, URINE: 1.02 (ref 1.005–1.030)
UROBILINOGEN UA: 2 mg/dL — AB (ref 0.0–1.0)

## 2015-06-06 MED ORDER — TETANUS-DIPHTH-ACELL PERTUSSIS 5-2.5-18.5 LF-MCG/0.5 IM SUSP
0.5000 mL | Freq: Once | INTRAMUSCULAR | Status: AC
  Administered 2015-06-06: 0.5 mL via INTRAMUSCULAR

## 2015-06-06 NOTE — Progress Notes (Signed)
Patient reports intense pain at the end of the day after working- tried tylenol but no relief

## 2015-06-06 NOTE — Patient Instructions (Signed)
Breastfeeding Deciding to breastfeed is one of the best choices you can make for you and your baby. A change in hormones during pregnancy causes your breast tissue to grow and increases the number and size of your milk ducts. These hormones also allow proteins, sugars, and fats from your blood supply to make breast milk in your milk-producing glands. Hormones prevent breast milk from being released before your baby is born as well as prompt milk flow after birth. Once breastfeeding has begun, thoughts of your baby, as well as his or her sucking or crying, can stimulate the release of milk from your milk-producing glands.  BENEFITS OF BREASTFEEDING For Your Baby  Your first milk (colostrum) helps your baby's digestive system function better.  There are antibodies in your milk that help your baby fight off infections.  Your baby has a lower incidence of asthma, allergies, and sudden infant death syndrome.  The nutrients in breast milk are better for your baby than infant formulas and are designed uniquely for your baby's needs.  Breast milk improves your baby's brain development.  Your baby is less likely to develop other conditions, such as childhood obesity, asthma, or type 2 diabetes mellitus. For You  Breastfeeding helps to create a very special bond between you and your baby.  Breastfeeding is convenient. Breast milk is always available at the correct temperature and costs nothing.  Breastfeeding helps to burn calories and helps you lose the weight gained during pregnancy.  Breastfeeding makes your uterus contract to its prepregnancy size faster and slows bleeding (lochia) after you give birth.   Breastfeeding helps to lower your risk of developing type 2 diabetes mellitus, osteoporosis, and breast or ovarian cancer later in life. SIGNS THAT YOUR BABY IS HUNGRY Early Signs of Hunger  Increased alertness or activity.  Stretching.  Movement of the head from side to  side.  Movement of the head and opening of the mouth when the corner of the mouth or cheek is stroked (rooting).  Increased sucking sounds, smacking lips, cooing, sighing, or squeaking.  Hand-to-mouth movements.  Increased sucking of fingers or hands. Late Signs of Hunger  Fussing.  Intermittent crying. Extreme Signs of Hunger Signs of extreme hunger will require calming and consoling before your baby will be able to breastfeed successfully. Do not wait for the following signs of extreme hunger to occur before you initiate breastfeeding:  Restlessness.  A loud, strong cry.  Screaming. BREASTFEEDING BASICS Breastfeeding Initiation  Find a comfortable place to sit or lie down, with your neck and back well supported.  Place a pillow or rolled up blanket under your baby to bring him or her to the level of your breast (if you are seated). Nursing pillows are specially designed to help support your arms and your baby while you breastfeed.  Make sure that your baby's abdomen is facing your abdomen.  Gently massage your breast. With your fingertips, massage from your chest wall toward your nipple in a circular motion. This encourages milk flow. You may need to continue this action during the feeding if your milk flows slowly.  Support your breast with 4 fingers underneath and your thumb above your nipple. Make sure your fingers are well away from your nipple and your baby's mouth.  Stroke your baby's lips gently with your finger or nipple.  When your baby's mouth is open wide enough, quickly bring your baby to your breast, placing your entire nipple and as much of the colored area around your nipple (  areola) as possible into your baby's mouth.  More areola should be visible above your baby's upper lip than below the lower lip.  Your baby's tongue should be between his or her lower gum and your breast.  Ensure that your baby's mouth is correctly positioned around your nipple  (latched). Your baby's lips should create a seal on your breast and be turned out (everted).  It is common for your baby to suck about 2-3 minutes in order to start the flow of breast milk. Latching Teaching your baby how to latch on to your breast properly is very important. An improper latch can cause nipple pain and decreased milk supply for you and poor weight gain in your baby. Also, if your baby is not latched onto your nipple properly, he or she may swallow some air during feeding. This can make your baby fussy. Burping your baby when you switch breasts during the feeding can help to get rid of the air. However, teaching your baby to latch on properly is still the best way to prevent fussiness from swallowing air while breastfeeding. Signs that your baby has successfully latched on to your nipple:  Silent tugging or silent sucking, without causing you pain.  Swallowing heard between every 3-4 sucks.  Muscle movement above and in front of his or her ears while sucking. Signs that your baby has not successfully latched on to nipple:  Sucking sounds or smacking sounds from your baby while breastfeeding.  Nipple pain. If you think your baby has not latched on correctly, slip your finger into the corner of your baby's mouth to break the suction and place it between your baby's gums. Attempt breastfeeding initiation again. Signs of Successful Breastfeeding Signs from your baby:  A gradual decrease in the number of sucks or complete cessation of sucking.  Falling asleep.  Relaxation of his or her body.  Retention of a small amount of milk in his or her mouth.  Letting go of your breast by himself or herself. Signs from you:  Breasts that have increased in firmness, weight, and size 1-3 hours after feeding.  Breasts that are softer immediately after breastfeeding.  Increased milk volume, as well as a change in milk consistency and color by the fifth day of breastfeeding.  Nipples  that are not sore, cracked, or bleeding. Signs That Your Baby is Getting Enough Milk  Wetting at least 3 diapers in a 24-hour period. The urine should be clear and pale yellow by age 5 days.  At least 3 stools in a 24-hour period by age 5 days. The stool should be soft and yellow.  At least 3 stools in a 24-hour period by age 7 days. The stool should be seedy and yellow.  No loss of weight greater than 10% of birth weight during the first 3 days of age.  Average weight gain of 4-7 ounces (113-198 g) per week after age 4 days.  Consistent daily weight gain by age 5 days, without weight loss after the age of 2 weeks. After a feeding, your baby may spit up a small amount. This is common. BREASTFEEDING FREQUENCY AND DURATION Frequent feeding will help you make more milk and can prevent sore nipples and breast engorgement. Breastfeed when you feel the need to reduce the fullness of your breasts or when your baby shows signs of hunger. This is called "breastfeeding on demand." Avoid introducing a pacifier to your baby while you are working to establish breastfeeding (the first 4-6 weeks   after your baby is born). After this time you may choose to use a pacifier. Research has shown that pacifier use during the first year of a baby's life decreases the risk of sudden infant death syndrome (SIDS). Allow your baby to feed on each breast as long as he or she wants. Breastfeed until your baby is finished feeding. When your baby unlatches or falls asleep while feeding from the first breast, offer the second breast. Because newborns are often sleepy in the first few weeks of life, you may need to awaken your baby to get him or her to feed. Breastfeeding times will vary from baby to baby. However, the following rules can serve as a guide to help you ensure that your baby is properly fed:  Newborns (babies 4 weeks of age or younger) may breastfeed every 1-3 hours.  Newborns should not go longer than 3 hours  during the day or 5 hours during the night without breastfeeding.  You should breastfeed your baby a minimum of 8 times in a 24-hour period until you begin to introduce solid foods to your baby at around 6 months of age. BREAST MILK PUMPING Pumping and storing breast milk allows you to ensure that your baby is exclusively fed your breast milk, even at times when you are unable to breastfeed. This is especially important if you are going back to work while you are still breastfeeding or when you are not able to be present during feedings. Your lactation consultant can give you guidelines on how long it is safe to store breast milk. A breast pump is a machine that allows you to pump milk from your breast into a sterile bottle. The pumped breast milk can then be stored in a refrigerator or freezer. Some breast pumps are operated by hand, while others use electricity. Ask your lactation consultant which type will work best for you. Breast pumps can be purchased, but some hospitals and breastfeeding support groups lease breast pumps on a monthly basis. A lactation consultant can teach you how to hand express breast milk, if you prefer not to use a pump. CARING FOR YOUR BREASTS WHILE YOU BREASTFEED Nipples can become dry, cracked, and sore while breastfeeding. The following recommendations can help keep your breasts moisturized and healthy:  Avoid using soap on your nipples.  Wear a supportive bra. Although not required, special nursing bras and tank tops are designed to allow access to your breasts for breastfeeding without taking off your entire bra or top. Avoid wearing underwire-style bras or extremely tight bras.  Air dry your nipples for 3-4minutes after each feeding.  Use only cotton bra pads to absorb leaked breast milk. Leaking of breast milk between feedings is normal.  Use lanolin on your nipples after breastfeeding. Lanolin helps to maintain your skin's normal moisture barrier. If you use  pure lanolin, you do not need to wash it off before feeding your baby again. Pure lanolin is not toxic to your baby. You may also hand express a few drops of breast milk and gently massage that milk into your nipples and allow the milk to air dry. In the first few weeks after giving birth, some women experience extremely full breasts (engorgement). Engorgement can make your breasts feel heavy, warm, and tender to the touch. Engorgement peaks within 3-5 days after you give birth. The following recommendations can help ease engorgement:  Completely empty your breasts while breastfeeding or pumping. You may want to start by applying warm, moist heat (in   the shower or with warm water-soaked hand towels) just before feeding or pumping. This increases circulation and helps the milk flow. If your baby does not completely empty your breasts while breastfeeding, pump any extra milk after he or she is finished.  Wear a snug bra (nursing or regular) or tank top for 1-2 days to signal your body to slightly decrease milk production.  Apply ice packs to your breasts, unless this is too uncomfortable for you.  Make sure that your baby is latched on and positioned properly while breastfeeding. If engorgement persists after 48 hours of following these recommendations, contact your health care provider or a lactation consultant. OVERALL HEALTH CARE RECOMMENDATIONS WHILE BREASTFEEDING  Eat healthy foods. Alternate between meals and snacks, eating 3 of each per day. Because what you eat affects your breast milk, some of the foods may make your baby more irritable than usual. Avoid eating these foods if you are sure that they are negatively affecting your baby.  Drink milk, fruit juice, and water to satisfy your thirst (about 10 glasses a day).  Rest often, relax, and continue to take your prenatal vitamins to prevent fatigue, stress, and anemia.  Continue breast self-awareness checks.  Avoid chewing and smoking  tobacco. Chemicals from cigarettes that pass into breast milk and exposure to secondhand smoke may harm your baby.  Avoid alcohol and drug use, including marijuana. Some medicines that may be harmful to your baby can pass through breast milk. It is important to ask your health care provider before taking any medicine, including all over-the-counter and prescription medicine as well as vitamin and herbal supplements. It is possible to become pregnant while breastfeeding. If birth control is desired, ask your health care provider about options that will be safe for your baby. SEEK MEDICAL CARE IF:  You feel like you want to stop breastfeeding or have become frustrated with breastfeeding.  You have painful breasts or nipples.  Your nipples are cracked or bleeding.  Your breasts are red, tender, or warm.  You have a swollen area on either breast.  You have a fever or chills.  You have nausea or vomiting.  You have drainage other than breast milk from your nipples.  Your breasts do not become full before feedings by the fifth day after you give birth.  You feel sad and depressed.  Your baby is too sleepy to eat well.  Your baby is having trouble sleeping.   Your baby is wetting less than 3 diapers in a 24-hour period.  Your baby has less than 3 stools in a 24-hour period.  Your baby's skin or the white part of his or her eyes becomes yellow.   Your baby is not gaining weight by 5 days of age. SEEK IMMEDIATE MEDICAL CARE IF:  Your baby is overly tired (lethargic) and does not want to wake up and feed.  Your baby develops an unexplained fever.   This information is not intended to replace advice given to you by your health care provider. Make sure you discuss any questions you have with your health care provider.   Document Released: 04/27/2005 Document Revised: 01/16/2015 Document Reviewed: 10/19/2012 Elsevier Interactive Patient Education 2016 [employer] Inc[employer]  Preterm  Labor Information Preterm labor is when labor starts at less than 37 weeks of pregnancy. The normal length of a pregnancy is 39 to 41 weeks. CAUSES Often, there is no identifiable underlying cause as to why a woman goes into preterm labor. One of the most common   known causes of preterm labor is infection. Infections of the uterus, cervix, vagina, amniotic sac, bladder, kidney, or even the lungs (pneumonia) can cause labor to start. Other suspected causes of preterm labor include:   Urogenital infections, such as yeast infections and bacterial vaginosis.   Uterine abnormalities (uterine shape, uterine septum, fibroids, or bleeding from the placenta).   A cervix that has been operated on (it may fail to stay closed).   Malformations in the fetus.   Multiple gestations (twins, triplets, and so on).   Breakage of the amniotic sac.  RISK FACTORS  Having a previous history of preterm labor.   Having premature rupture of membranes (PROM).   Having a placenta that covers the opening of the cervix (placenta previa).   Having a placenta that separates from the uterus (placental abruption).   Having a cervix that is too weak to hold the fetus in the uterus (incompetent cervix).   Having too much fluid in the amniotic sac (polyhydramnios).   Taking illegal drugs or smoking while pregnant.   Not gaining enough weight while pregnant.   Being younger than 18 and older than 32 years old.   Having a low socioeconomic status.   Being African American. SYMPTOMS Signs and symptoms of preterm labor include:   Menstrual-like cramps, abdominal pain, or back pain.  Uterine contractions that are regular, as frequent as six in an hour, regardless of their intensity (may be mild or painful).  Contractions that start on the top of the uterus and spread down to the lower abdomen and back.   A sense of increased pelvic pressure.   A watery or bloody mucus discharge that comes  from the vagina.  TREATMENT Depending on the length of the pregnancy and other circumstances, your health care provider may suggest bed rest. If necessary, there are medicines that can be given to stop contractions and to mature the fetal lungs. If labor happens before 34 weeks of pregnancy, a prolonged hospital stay may be recommended. Treatment depends on the condition of both you and the fetus.  WHAT SHOULD YOU DO IF YOU THINK YOU ARE IN PRETERM LABOR? Call your health care provider right away. You will need to go to the hospital to get checked immediately. HOW CAN YOU PREVENT PRETERM LABOR IN FUTURE PREGNANCIES? You should:   Stop smoking if you smoke.  Maintain healthy weight gain and avoid chemicals and drugs that are not necessary.  Be watchful for any type of infection.  Inform your health care provider if you have a known history of preterm labor.   This information is not intended to replace advice given to you by your health care provider. Make sure you discuss any questions you have with your health care provider.   Document Released: 07/18/2003 Document Revised: 12/28/2012 Document Reviewed: 05/30/2012 Elsevier Interactive Patient Education 2016 Elsevier Inc.  

## 2015-06-07 LAB — HIV ANTIBODY (ROUTINE TESTING W REFLEX): HIV 1&2 Ab, 4th Generation: NONREACTIVE

## 2015-06-07 LAB — RPR

## 2015-06-07 LAB — GLUCOSE TOLERANCE, 1 HOUR (50G) W/O FASTING: Glucose, 1 Hour GTT: 109 mg/dL (ref 70–140)

## 2015-06-07 NOTE — Progress Notes (Signed)
Subjective:  Krystal Chan is a 30 y.o. [id number] at [redacted]w[redacted]d being seen today for ongoing prenatal care.  She is currently monitored for the following issues for this high-risk pregnancy and has Supervision of high risk pregnancy, antepartum and History of preterm delivery, currently pregnant on her problem list.  Patient reports no complaints.  Contractions: Irritability. Vag. Bleeding: None.  Movement: Present. Denies leaking of fluid.   The following portions of the patient's history were reviewed and updated as appropriate: allergies, current medications, past family history, past medical history, past social history, past surgical history and problem list. Problem list updated.  Objective:   Filed Vitals:   06/06/15 1139  BP: 116/70  Pulse: 85  Weight: 212 lb (96.163 kg)    Fetal Status: Fetal Heart Rate (bpm): 145 Fundal Height: 30 cm Movement: Present     General:  Alert, oriented and cooperative. Patient is in no acute distress.  Skin: Skin is warm and dry. No rash noted.   Cardiovascular: Normal heart rate noted  Respiratory: Normal respiratory effort, no problems with respiration noted  Abdomen: Soft, gravid, appropriate for gestational age. Pain/Pressure: Present     Pelvic: Vag. Bleeding: None Vag D/C Character: Thin   Cervical exam deferred        Extremities: Normal range of motion.  Edema: None  Mental Status: Normal mood and affect. Normal behavior. Normal judgment and thought content.   Urinalysis: Urine Protein: 1+ Urine Glucose: Negative  Assessment and Plan:  Pregnancy: [id number] at [redacted]w[redacted]d  1. Supervision of high risk pregnancy, antepartum, third trimester Continue prenatal care.  - RPR - HIV antibody (with reflex) - Glucose Tolerance, 1 HR (50g) w/o Fasting - Flu Vaccine QUAD 36+ mos IM; Standing - Tdap (BOOSTRIX) injection 0.5 mL; Inject 0.5 mLs into the muscle once. - Flu Vaccine QUAD 36+ mos IM  2. History of preterm delivery, currently pregnant, third  trimester Continue 17 P  Preterm labor symptoms and general obstetric precautions including but not limited to vaginal bleeding, contractions, leaking of fluid and fetal movement were reviewed in detail with the patient. Please refer to After Visit Summary for other counseling recommendations.  Return in 2 weeks (on 06/20/2015) for 17 P weekly.   Reva Bores, MD

## 2015-06-13 ENCOUNTER — Ambulatory Visit (INDEPENDENT_AMBULATORY_CARE_PROVIDER_SITE_OTHER): Payer: Medicaid Other | Admitting: General Practice

## 2015-06-13 VITALS — BP 115/58 | HR 86 | Temp 98.1°F | Ht 61.0 in | Wt 212.0 lb

## 2015-06-13 DIAGNOSIS — Z8751 Personal history of pre-term labor: Secondary | ICD-10-CM

## 2015-06-13 DIAGNOSIS — O09213 Supervision of pregnancy with history of pre-term labor, third trimester: Secondary | ICD-10-CM

## 2015-06-18 ENCOUNTER — Inpatient Hospital Stay (HOSPITAL_COMMUNITY)
Admission: AD | Admit: 2015-06-18 | Discharge: 2015-06-18 | Disposition: A | Discharge status: Home / Self Care | Payer: Medicaid Other | Source: Ambulatory Visit | Attending: Family Medicine | Admitting: Family Medicine

## 2015-06-18 ENCOUNTER — Encounter (HOSPITAL_COMMUNITY): Payer: Self-pay | Admitting: *Deleted

## 2015-06-18 DIAGNOSIS — Z3A30 30 weeks gestation of pregnancy: Secondary | ICD-10-CM | POA: Insufficient documentation

## 2015-06-18 DIAGNOSIS — N760 Acute vaginitis: Secondary | ICD-10-CM

## 2015-06-18 DIAGNOSIS — O4703 False labor before 37 completed weeks of gestation, third trimester: Secondary | ICD-10-CM

## 2015-06-18 DIAGNOSIS — O479 False labor, unspecified: Secondary | ICD-10-CM

## 2015-06-18 DIAGNOSIS — B9689 Other specified bacterial agents as the cause of diseases classified elsewhere: Secondary | ICD-10-CM

## 2015-06-18 LAB — URINALYSIS, ROUTINE W REFLEX MICROSCOPIC
Bilirubin Urine: NEGATIVE
GLUCOSE, UA: NEGATIVE mg/dL
Hgb urine dipstick: NEGATIVE
KETONES UR: NEGATIVE mg/dL
NITRITE: NEGATIVE
PH: 6 (ref 5.0–8.0)
Protein, ur: NEGATIVE mg/dL
SPECIFIC GRAVITY, URINE: 1.015 (ref 1.005–1.030)

## 2015-06-18 LAB — WET PREP, GENITAL
Sperm: NONE SEEN
TRICH WET PREP: NONE SEEN
Yeast Wet Prep HPF POC: NONE SEEN

## 2015-06-18 LAB — URINE MICROSCOPIC-ADD ON: RBC / HPF: NONE SEEN RBC/hpf (ref 0–5)

## 2015-06-18 MED ORDER — METRONIDAZOLE 500 MG PO TABS: 500.0000 mg | ORAL_TABLET | Freq: Two times a day (BID) | ORAL | Status: DC

## 2015-06-18 MED ORDER — NIFEDIPINE 10 MG PO CAPS
10.0000 mg | ORAL_CAPSULE | Freq: Once | ORAL | Status: AC
  Administered 2015-06-18: 10 mg via ORAL
  Filled 2015-06-18: qty 1

## 2015-06-18 NOTE — MAU Provider Note (Signed)
History     CSN: [id number]  Arrival date and time: 06/18/15 4098   First Provider Initiated Contact with Patient 06/18/15 [phone]      Chief Complaint  Patient presents with  . Abdominal Cramping   HPI [name] is a 29yo [id number] at [redacted]w[redacted]d by LMP c/w 9w [name] with a history of presents with contractions and LOF. Patient believes these are braxton hicks contractions which initially started at 5pm 2/6 and worsened around 6pm. These contractions occur every 3 hours and last for 10 minutes; was able to talk through contractions. Patient also reports LOF (clear) when she felt downward pressure from baby (at 7:30 PM 2/6). Has not felt LOF since then. Patient denies increased vaginal discharge or a change from her normal discharge. Patient denies sexual intercourse in the past 24 hours. Denies vaginal bleeding. Endorses fetal movement. Denies fevers, chills, N/V, diarrhea, HA. Patient reports she has been staying well hydrated.   Past Medical History  Diagnosis Date  . Medical history non-contributory     Past Surgical History  Procedure Laterality Date  . Hernia repair      gastic hernia 2005 and umbilical hernia 2004    Family History  Problem Relation Age of Onset  . Breast cancer Maternal Grandmother   . Hypertension Maternal Grandmother   . Cancer Maternal Grandmother   . Diabetes Maternal Grandfather   . Hypertension Mother   . Heart disease Mother   . Hypertension Paternal Grandmother   . Diabetes Paternal Grandmother   . Hearing loss Neg Hx     Social History  Substance Use Topics  . Smoking status: Never Smoker   . Smokeless tobacco: Never Used  . Alcohol Use: No     Comment: ocassionally    Allergies: No Known Allergies  Facility-administered medications prior to admission  Medication Dose Route Frequency Provider Last Rate Last Dose  . hydroxyprogesterone caproate (DELALUTIN) 250 mg/mL injection 250 mg  250 mg Intramuscular Weekly Krystal Bores, MD   250 mg at  06/13/15 1051   Prescriptions prior to admission  Medication Sig Dispense Refill Last Dose  . Acetaminophen (TYLENOL PO) Take 2 tablets by mouth every 6 (six) hours as needed (headaches and pain).    06/18/2015 at Unknown time  . Prenatal Vit-Min-FA-Fish Oil ([employer] PRENATAL GUMMY PO) Take 2 tablets by mouth daily.   06/18/2015 at Unknown time    ROS Physical Exam   Blood pressure 125/67, pulse 91, temperature 97.7 F (36.5 C), temperature source Oral, resp. rate 16, weight 97.07 kg (214 lb), last menstrual period 11/16/2014, unknown if currently breastfeeding.  Physical Exam GEN: NAD ABD: Soft, nontender, gravid Speculum Exam: no pooling of fluid  Cervix: closed and thick (per Krystal Chan)   MAU Course  Procedures  MDM Some irritability noted on toco. Fetal fibronectic collected prior to speculum exam, however was not sent as there was no pooling of fluid. Wet prep, Gc/Chlamydia ordered. UA collected.   UA unremarkable other than trance LE. Wet prep positive for clue cells. Gc/C pending Procardia  x 1 given due to continued irritability noted on tocometer although patient does not report contractions.   Second dose of Procardia given. No significant change in irritability noted on tocometer but no contractions during MAU observation.  Discussed with [name].   Assessment and Plan  Ordered Flagyl  BID for 7 days No signs or symptoms of PTL Given return precautions Given letter for return to work   [employer]  [place] 06/18/2015, 9:23 AM  CNM attestation:  I have seen and examined this patient; I agree with above documentation in the Resident's note.    Krystal Chan, CNM 7:10 PM

## 2015-06-18 NOTE — Progress Notes (Signed)
Dr. Ottie Glazier notified of pt arrival to MAU.  Notified that she came in with complaints fo braxton hicks contractions and leaking when the babies head pushes down.  States she will come see the pt.

## 2015-06-18 NOTE — MAU Note (Signed)
increase in [place], cramping.  Increase in pressure and "when she pushes down, there is a little leakage".

## 2015-06-18 NOTE — Discharge Instructions (Signed)
Braxton Hicks Contractions °Contractions of the uterus can occur throughout pregnancy. Contractions are not always a sign that you are in labor.  °WHAT ARE BRAXTON HICKS CONTRACTIONS?  °Contractions that occur before labor are called Braxton Hicks contractions, or false labor. Toward the end of pregnancy (32-34 weeks), these contractions can develop more often and may become more forceful. This is not true labor because these contractions do not result in opening (dilatation) and thinning of the cervix. They are sometimes difficult to tell apart from true labor because these contractions can be forceful and people have different pain tolerances. You should not feel embarrassed if you go to the hospital with false labor. Sometimes, the only way to tell if you are in true labor is for your health care provider to look for changes in the cervix. °If there are no prenatal problems or other health problems associated with the pregnancy, it is completely safe to be sent home with false labor and await the onset of true labor. °HOW CAN YOU TELL THE DIFFERENCE BETWEEN TRUE AND FALSE LABOR? °False Labor °· The contractions of false labor are usually shorter and not as hard as those of true labor.   °· The contractions are usually irregular.   °· The contractions are often felt in the front of the lower abdomen and in the groin.   °· The contractions may go away when you walk around or change positions while lying down.   °· The contractions get weaker and are shorter lasting as time goes on.   °· The contractions do not usually become progressively stronger, regular, and closer together as with true labor.   °True Labor °· Contractions in true labor last 30-70 seconds, become very regular, usually become more intense, and increase in frequency.   °· The contractions do not go away with walking.   °· The discomfort is usually felt in the top of the uterus and spreads to the lower abdomen and low back.   °· True labor can be  determined by your health care provider with an exam. This will show that the cervix is dilating and getting thinner.   °WHAT TO REMEMBER °· Keep up with your usual exercises and follow other instructions given by your health care provider.   °· Take medicines as directed by your health care provider.   °· Keep your regular prenatal appointments.   °· Eat and drink lightly if you think you are going into labor.   °· If Braxton Hicks contractions are making you uncomfortable:   °¨ Change your position from lying down or resting to walking, or from walking to resting.   °¨ Sit and rest in a tub of warm water.   °¨ Drink 2-3 glasses of water. Dehydration may cause these contractions.   °¨ Do slow and deep breathing several times an hour.   °WHEN SHOULD I SEEK IMMEDIATE MEDICAL CARE? °Seek immediate medical care if: °· Your contractions become stronger, more regular, and closer together.   °· You have fluid leaking or gushing from your vagina.   °· You have a fever.   °· You pass blood-tinged mucus.   °· You have vaginal bleeding.   °· You have continuous abdominal pain.   °· You have low back pain that you never had before.   °· You feel your baby's head pushing down and causing pelvic pressure.   °· Your baby is not moving as much as it used to.   °  °This information is not intended to replace advice given to you by your health care provider. Make sure you discuss any questions you have with your health care   provider.   Document Released: 04/27/2005 Document Revised: 05/02/2013 Document Reviewed: 02/06/2013 Elsevier Interactive Patient Education 2016 Else[employer] Inc.  Bacterial Vaginosis Bacterial vaginosis is a vaginal infection that occurs when the normal balance of bacteria in the vagina is disrupted. It results from an overgrowth of certain bacteria. This is the most common vaginal infection in women of childbearing age. Treatment is important to prevent complications, especially in pregnant women, as it can  cause a premature delivery. CAUSES  Bacterial vaginosis is caused by an increase in harmful bacteria that are normally present in smaller amounts in the vagina. Several different kinds of bacteria can cause bacterial vaginosis. However, the reason that the condition develops is not fully understood. RISK FACTORS Certain activities or behaviors can put you at an increased risk of developing bacterial vaginosis, including:  Having a new sex partner or multiple sex partners.  Douching.  Using an intrauterine device (IUD) for contraception. Women do not get bacterial vaginosis from toilet seats, bedding, swimming pools, or contact with objects around them. SIGNS AND SYMPTOMS  Some women with bacterial vaginosis have no signs or symptoms. Common symptoms include:  Grey vaginal discharge.  A fishlike odor with discharge, especially after sexual intercourse.  Itching or burning of the vagina and vulva.  Burning or pain with urination. DIAGNOSIS  Your health care provider will take a medical history and examine the vagina for signs of bacterial vaginosis. A sample of vaginal fluid may be taken. Your health care provider will look at this sample under a microscope to check for bacteria and abnormal cells. A vaginal pH test may also be done.  TREATMENT  Bacterial vaginosis may be treated with antibiotic medicines. These may be given in the form of a pill or a vaginal cream. A second round of antibiotics may be prescribed if the condition comes back after treatment. Because bacterial vaginosis increases your risk for sexually transmitted diseases, getting treated can help reduce your risk for chlamydia, gonorrhea, HIV, and herpes. HOME CARE INSTRUCTIONS   Only take over-the-counter or prescription medicines as directed by your health care provider.  If antibiotic medicine was prescribed, take it as directed. Make sure you finish it even if you start to feel better.  Tell all sexual partners that  you have a vaginal infection. They should see their health care provider and be treated if they have problems, such as a mild rash or itching.  During treatment, it is important that you follow these instructions:  Avoid sexual activity or use condoms correctly.  Do not douche.  Avoid alcohol as directed by your health care provider.  Avoid breastfeeding as directed by your health care provider. SEEK MEDICAL CARE IF:   Your symptoms are not improving after 3 days of treatment.  You have increased discharge or pain.  You have a fever. MAKE SURE YOU:   Understand these instructions.  Will watch your condition.  Will get help right away if you are not doing well or get worse. FOR MORE INFORMATION  Centers for Disease Control and Prevention, Division of STD Prevention: [employer] American Sexual Health Association (ASHA): www.ashastd.org    This information is not intended to replace advice given to you by your health care provider. Make sure you discuss any questions you have with your health care provider.   Document Released: 04/27/2005 Document Revised: 05/18/2014 Document Reviewed: 12/07/2012 Elsevier Interactive Patient Education [employer]

## 2015-06-18 NOTE — Progress Notes (Signed)
Dr. Alvester Morin notified that all results are back. Provider states she will look them over and determine the plan of care.

## 2015-06-19 LAB — GC/CHLAMYDIA PROBE AMP (~~LOC~~) NOT AT ARMC
Chlamydia: NEGATIVE
Neisseria Gonorrhea: NEGATIVE

## 2015-06-20 ENCOUNTER — Ambulatory Visit (INDEPENDENT_AMBULATORY_CARE_PROVIDER_SITE_OTHER): Payer: Medicaid Other | Admitting: Obstetrics & Gynecology

## 2015-06-20 ENCOUNTER — Encounter: Payer: Self-pay | Admitting: Obstetrics & Gynecology

## 2015-06-20 VITALS — BP 113/70 | HR 91 | Wt 213.0 lb

## 2015-06-20 DIAGNOSIS — O09213 Supervision of pregnancy with history of pre-term labor, third trimester: Secondary | ICD-10-CM

## 2015-06-20 DIAGNOSIS — O09893 Supervision of other high risk pregnancies, third trimester: Secondary | ICD-10-CM

## 2015-06-20 LAB — POCT URINALYSIS DIP (DEVICE)
BILIRUBIN URINE: NEGATIVE
Glucose, UA: NEGATIVE mg/dL
Leukocytes, UA: NEGATIVE
NITRITE: NEGATIVE
PH: 7 (ref 5.0–8.0)
Protein, ur: 30 mg/dL — AB
SPECIFIC GRAVITY, URINE: 1.025 (ref 1.005–1.030)
Urobilinogen, UA: 1 mg/dL (ref 0.0–1.0)

## 2015-06-20 NOTE — Progress Notes (Signed)
Subjective: treated for BV in MAU  Tiffney Haughton is a 30 y.o. [id number] at [redacted]w[redacted]d being seen today for ongoing prenatal care.  She is currently monitored for the following issues for this high-risk pregnancy and has Supervision of high risk pregnancy, antepartum and History of preterm delivery, currently pregnant on her problem list.  Patient report pelvic pressure.  Contractions: Not present. Vag. Bleeding: None.  Movement: Present. Denies leaking of fluid.   The following portions of the patient's history were reviewed and updated as appropriate: allergies, current medications, past family history, past medical history, past social history, past surgical history and problem list. Problem list updated.  Objective:   Filed Vitals:   06/20/15 0936  BP: 113/70  Pulse: 91  Weight: 213 lb (96.616 kg)    Fetal Status: Fetal Heart Rate (bpm): 147   Movement: Present     General:  Alert, oriented and cooperative. Patient is in no acute distress.  Skin: Skin is warm and dry. No rash noted.   Cardiovascular: Normal heart rate noted  Respiratory: Normal respiratory effort, no problems with respiration noted  Abdomen: Soft, gravid, appropriate for gestational age. Pain/Pressure: Present     Pelvic: Vag. Bleeding: None     Cervical exam deferred        Extremities: Normal range of motion.  Edema: None  Mental Status: Normal mood and affect. Normal behavior. Normal judgment and thought content.   Urinalysis:      Assessment and Plan:  Pregnancy: [id number] at [redacted]w[redacted]d  1. History of preterm delivery, currently pregnant, third trimester On Flagyl for BV 17 P given Preterm labor symptoms and general obstetric precautions including but not limited to vaginal bleeding, contractions, leaking of fluid and fetal movement were reviewed in detail with the patient. Please refer to After Visit Summary for other counseling recommendations.  Return in about 2 weeks (around 07/04/2015).   Adam Phenix, MD

## 2015-06-20 NOTE — Patient Instructions (Signed)

## 2015-06-27 ENCOUNTER — Ambulatory Visit (INDEPENDENT_AMBULATORY_CARE_PROVIDER_SITE_OTHER): Payer: Medicaid Other | Admitting: General Practice

## 2015-06-27 VITALS — BP 125/76 | HR 91 | Temp 97.9°F | Ht 61.0 in | Wt 214.0 lb

## 2015-06-27 DIAGNOSIS — O09213 Supervision of pregnancy with history of pre-term labor, third trimester: Secondary | ICD-10-CM

## 2015-07-03 ENCOUNTER — Encounter (HOSPITAL_COMMUNITY): Payer: Self-pay | Admitting: *Deleted

## 2015-07-03 ENCOUNTER — Inpatient Hospital Stay (HOSPITAL_COMMUNITY)
Admission: AD | Admit: 2015-07-03 | Discharge: 2015-07-03 | Disposition: A | Discharge status: Home / Self Care | Payer: Medicaid Other | Source: Ambulatory Visit | Attending: Obstetrics and Gynecology | Admitting: Obstetrics and Gynecology

## 2015-07-03 DIAGNOSIS — O4703 False labor before 37 completed weeks of gestation, third trimester: Secondary | ICD-10-CM | POA: Diagnosis not present

## 2015-07-03 DIAGNOSIS — O09213 Supervision of pregnancy with history of pre-term labor, third trimester: Secondary | ICD-10-CM | POA: Insufficient documentation

## 2015-07-03 DIAGNOSIS — N898 Other specified noninflammatory disorders of vagina: Secondary | ICD-10-CM | POA: Insufficient documentation

## 2015-07-03 DIAGNOSIS — Z3A32 32 weeks gestation of pregnancy: Secondary | ICD-10-CM | POA: Diagnosis not present

## 2015-07-03 LAB — URINE MICROSCOPIC-ADD ON: RBC / HPF: NONE SEEN RBC/hpf (ref 0–5)

## 2015-07-03 LAB — URINALYSIS, ROUTINE W REFLEX MICROSCOPIC
BILIRUBIN URINE: NEGATIVE
GLUCOSE, UA: NEGATIVE mg/dL
HGB URINE DIPSTICK: NEGATIVE
Ketones, ur: 40 mg/dL — AB
Nitrite: NEGATIVE
PH: 6.5 (ref 5.0–8.0)
Protein, ur: NEGATIVE mg/dL
SPECIFIC GRAVITY, URINE: 1.02 (ref 1.005–1.030)

## 2015-07-03 LAB — WET PREP, GENITAL
Clue Cells Wet Prep HPF POC: NONE SEEN
SPERM: NONE SEEN
Trich, Wet Prep: NONE SEEN
Yeast Wet Prep HPF POC: NONE SEEN

## 2015-07-03 LAB — POCT FERN TEST: POCT FERN TEST: NEGATIVE

## 2015-07-03 NOTE — MAU Note (Signed)
Pt presents to MAU via EMS with complaints of contractions and leakage of fluid. Denies any vaginal bleeding

## 2015-07-03 NOTE — MAU Provider Note (Signed)
History     CSN: [id number]  Arrival date and time: 07/03/15 1145    Chief Complaint  Patient presents with  . Contractions   HPI Krystal Chan is a 30yo [id number] at [redacted]w[redacted]d by LMP c/w 9w [name] with a history of presents with contractions and LOF. She is a high risk patient Patient states that around lunch time today she was going to the bathroom and noticed clear vaginal discharge on her underwear (did not soak through to her pants). Other than the episode of vaginal discharge today, she has not had anything like that before. Denies vaginal bleeding or intercourse in the last 24 hours. Additionally she is concerned with contractions she is having, mostly felt at the top of her stomach, does not know the frequency of these contractions. Endorses positive fetal movement.     Past Medical History  Diagnosis Date  . Medical history non-contributory     Past Surgical History  Procedure Laterality Date  . Hernia repair      gastic hernia 2005 and umbilical hernia 2004    Family History  Problem Relation Age of Onset  . Breast cancer Maternal Grandmother   . Hypertension Maternal Grandmother   . Cancer Maternal Grandmother   . Diabetes Maternal Grandfather   . Hypertension Mother   . Heart disease Mother   . Hypertension Paternal Grandmother   . Diabetes Paternal Grandmother   . Hearing loss Neg Hx     Social History  Substance Use Topics  . Smoking status: Never Smoker   . Smokeless tobacco: Never Used  . Alcohol Use: No     Comment: ocassionally    Allergies: No Known Allergies  Facility-administered medications prior to admission  Medication Dose Route Frequency Provider Last Rate Last Dose  . hydroxyprogesterone caproate (DELALUTIN) 250 mg/mL injection 250 mg  250 mg Intramuscular Weekly Reva Bores, MD   250 mg at 06/27/15 1003   Prescriptions prior to admission  Medication Sig Dispense Refill Last Dose  . Prenatal Vit-Min-FA-Fish Oil ([facility] PRENATAL GUMMY PO) Take 2  tablets by mouth daily.   07/03/2015 at Unknown time  . Acetaminophen (TYLENOL PO) Take 2 tablets by mouth every 6 (six) hours as needed (headaches and pain).    prn    Review of Systems  Constitutional: Negative for fever, chills and weight loss.  HENT: Negative for congestion and sore throat.   Eyes: Negative for blurred vision, double vision and pain.  Respiratory: Negative for cough and wheezing.   Cardiovascular: Negative for chest pain, palpitations and leg swelling.  Gastrointestinal: Negative for heartburn, nausea, vomiting, abdominal pain, diarrhea and constipation.  Genitourinary: Positive for frequency. Negative for dysuria, urgency and hematuria.  Musculoskeletal: Negative for back pain.  Neurological: Positive for tingling (bilateral hands at night ). Negative for dizziness, focal weakness and headaches.  Psychiatric/Behavioral: Negative for depression.   Physical Exam   Blood pressure 130/8, pulse 101, temperature 98.2 F (36.8 C), resp. rate 18, last menstrual period 11/16/2014, unknown if currently breastfeeding.  Physical Exam  Constitutional: She is oriented to person, place, and time. She appears well-developed and well-nourished. No distress.  HENT:  Head: Normocephalic and atraumatic.  Right Ear: External ear normal.  Left Ear: External ear normal.  Mouth/Throat: Oropharynx is clear and moist.  Eyes: Conjunctivae are normal. Pupils are equal, round, and reactive to light.  Neck: Normal range of motion. Neck supple.  Cardiovascular: Normal rate, regular rhythm, normal heart sounds and intact distal pulses.  No murmur heard. Respiratory: Effort normal and breath sounds normal. No respiratory distress. She has no wheezes.  GI: Soft. Bowel sounds are normal. She exhibits distension (gravid abdomen). There is no tenderness.  Midline healed incision from previous surgery  Musculoskeletal: Normal range of motion. She exhibits no edema or tenderness.  Neurological:  She is alert and oriented to person, place, and time. She has normal reflexes.  Skin: Skin is warm and dry. No rash noted.  Psychiatric: She has a normal mood and affect. Her behavior is normal. Judgment and thought content normal.  Pelvic exam: normal external genitalia, vulva, vagina, cervix. Milky white discharge seen on speculum exam. No pooling of fluid.   Dilation: Closed Exam by:: Jamaris Biernat   MAU Course  Procedures  MDM Fetal Heart Tones reactive Speculum exam performed to check for ROM and ferning. Ordered Wet Prep and GC/Chlamydia testing to ensure patient does not have STD. Ordered UA due to complaints of increased urinary frequency  Assessment and Plan  A: [name] is a 30YN [id number] at [redacted]w[redacted]d by LMP c/w 9w [name] with a history of presents with contractions and LOF. She is a high risk patient pregnancy due to previous pre-term delivery with last pregnancy and is currently being treated with 17 P. Speculum exam revealed no pooling and ferning test negative. Wet negative for trich or BV. UA showed rare bacteria, 40 ketones, small leuk, 0-5 squamous cells (likely contaminant). FHT reactive and Cervical exam reveals closed cervix.  P: -Labor and PPROM precautions given -Follow up GC/Chlamydia testing -Follow up with OB provider   Beaulah Dinning 07/03/2015, 12:53 PM   OB FELLOW MAU DISCHARGE ATTESTATION  I have seen and examined this patient; I agree with above documentation in the resident's note. Story not highly suspicious for PPROM, and on exam no pooling, fern negative. Likely physiologic discharge. STrict pprom return precautions discussed.   Silvano Bilis, MD 7:55 PM

## 2015-07-03 NOTE — Discharge Instructions (Signed)
Your lab work looks ok, we will call you with the results of the STD tests if they come back positive.   Come to the MAU (maternity admission unit) for 1) Strong contractions every 2-3 minutes for at least 1 hour that do no go away when you drink water or take a warm shower. These contractions will be so strong all you can do is breath through them 2) Vaginal bleeding- anything more than spotting 3) Loss of fluid like you broke your water 4) Decreased movement of your baby  Premature Rupture and Preterm Premature Rupture of Membranes Premature rupture of membranes (PROM) is when the membranes (amniotic sac) break open before contractions or labor starts. Rupture of membranes is commonly referred to as your water breaking. If PROM occurs before 37 weeks of pregnancy, it is called preterm premature rupture of membranes (PPROM). The amniotic sac holds the fetus, keeps infection out, and performs other important functions. Having the amniotic sac rupture before 37 weeks of pregnancy can lead to serious problems and requires immediate attention by your health care provider. CAUSES  PROM near the end of the pregnancy may be caused by natural weakening of the membranes. PPROM is often due to an infection. Other factors that may be associated with PROM include:  Stretching of the amniotic sac because of carrying multiples or having too much amniotic fluid.  Trauma.  Smoking during pregnancy.  Poor nutrition.  Previous preterm birth.  Vaginal bleeding.  Little to no prenatal care.  Problems with the placenta, such as placenta previa or placental abruption. RISKS OF PROM AND PPROM  Delivering a premature baby.  Getting a serious infection of the placental tissues (chorioamnionitis).  Early detachment of the placenta from the uterus (placental abruption).  Compression of the umbilical cord.  Needing a cesarean birth.  Developing a serious infection after delivery. SIGNS OF PROM OR PPROM    A sudden gush or slow leaking of fluid from the vagina.  Constant wet underwear. Sometimes, women mistake the leaking or wetness for urine, especially if the leak is slow and not a gush of fluid. If there is constant leaking or your underwear continues to get wet, your membranes have likely ruptured. WHAT TO DO IF YOU THINK YOUR MEMBRANES HAVE RUPTURED Call your health care provider right away. You will need to go to the hospital to get checked immediately. WHAT HAPPENS IF YOU ARE DIAGNOSED WITH PROM OR PPROM? Once you arrive at the hospital, you will have tests done. A cervical exam will be performed to check if the cervix has softened or started to open (dilate). If you are diagnosed with PROM, you may be induced within 24 hours if you are not having contractions. If you are diagnosed with PPROM and are not having contractions, you may be induced depending on your trimester.  If you have PPROM, you:  And your baby will be monitored closely for signs of infection or other complications.  May be given an antibiotic medicine to lower the chances of an infection developing.  May be given a steroid medicine to help mature the baby's lungs faster.  May be given a medicine to stop preterm labor.  May be ordered to be on bed rest at home or in the hospital.  May be induced if complications arise for you or the baby. Your treatment will depend on many factors, such as how far along you are, the development of the baby, and other complications that may arise.   This  information is not intended to replace advice given to you by your health care provider. Make sure you discuss any questions you have with your health care provider.   Document Released: 04/27/2005 Document Revised: 02/15/2013 Document Reviewed: 08/16/2012 Elsevier Interactive Patient Education [employer]

## 2015-07-04 ENCOUNTER — Ambulatory Visit (INDEPENDENT_AMBULATORY_CARE_PROVIDER_SITE_OTHER): Payer: Medicaid Other | Admitting: Obstetrics & Gynecology

## 2015-07-04 VITALS — BP 118/71 | HR 86 | Temp 97.5°F | Wt 213.0 lb

## 2015-07-04 DIAGNOSIS — O09213 Supervision of pregnancy with history of pre-term labor, third trimester: Secondary | ICD-10-CM

## 2015-07-04 DIAGNOSIS — O0993 Supervision of high risk pregnancy, unspecified, third trimester: Secondary | ICD-10-CM

## 2015-07-04 LAB — POCT URINALYSIS DIP (DEVICE)
BILIRUBIN URINE: NEGATIVE
GLUCOSE, UA: NEGATIVE mg/dL
KETONES UR: 15 mg/dL — AB
LEUKOCYTES UA: NEGATIVE
Nitrite: NEGATIVE
PROTEIN: 30 mg/dL — AB
SPECIFIC GRAVITY, URINE: 1.025 (ref 1.005–1.030)
Urobilinogen, UA: 4 mg/dL — ABNORMAL HIGH (ref 0.0–1.0)
pH: 7 (ref 5.0–8.0)

## 2015-07-04 LAB — GC/CHLAMYDIA PROBE AMP (~~LOC~~) NOT AT ARMC
Chlamydia: NEGATIVE
NEISSERIA GONORRHEA: NEGATIVE

## 2015-07-04 NOTE — Progress Notes (Signed)
Subjective:seen in MAU r/o PTL and ROM 07/03/15  Krystal Chan is a 30 y.o. [id number] at [redacted]w[redacted]d being seen today for ongoing prenatal care.  She is currently monitored for the following issues for this high-risk pregnancy and has Supervision of high risk pregnancy, antepartum and History of preterm delivery, currently pregnant on her problem list.  Patient reports occasional contractions and pressure and difficulty at work.  Contractions: Irritability. Vag. Bleeding: None.  Movement: Present. Denies leaking of fluid.   The following portions of the patient's history were reviewed and updated as appropriate: allergies, current medications, past family history, past medical history, past social history, past surgical history and problem list. Problem list updated.  Objective:   Filed Vitals:   07/04/15 1036  BP: 118/71  Pulse: 86  Temp: 97.5 F (36.4 C)  Weight: 213 lb (96.616 kg)    Fetal Status: Fetal Heart Rate (bpm): 150   Movement: Present     General:  Alert, oriented and cooperative. Patient is in no acute distress.  Skin: Skin is warm and dry. No rash noted.   Cardiovascular: Normal heart rate noted  Respiratory: Normal respiratory effort, no problems with respiration noted  Abdomen: Soft, gravid, appropriate for gestational age. Pain/Pressure: Present     Pelvic: Vag. Bleeding: None     Cervical exam deferred        Extremities: Normal range of motion.  Edema: Trace  Mental Status: Normal mood and affect. Normal behavior. Normal judgment and thought content.   Urinalysis:      Assessment and Plan:  Pregnancy: [id number] at [redacted]w[redacted]d  1. Supervision of high risk pregnancy, antepartum, third trimester Discomforts of pregnancy  Preterm labor symptoms and general obstetric precautions including but not limited to vaginal bleeding, contractions, leaking of fluid and fetal movement were reviewed in detail with the patient. Please refer to After Visit Summary for other counseling  recommendations.  Return in about 2 weeks (around 07/18/2015). Discuss work environment with SW today  Adam Phenix, MD

## 2015-07-04 NOTE — Progress Notes (Signed)
Reviewed tip of week with patient  

## 2015-07-04 NOTE — Patient Instructions (Signed)

## 2015-07-11 ENCOUNTER — Ambulatory Visit (INDEPENDENT_AMBULATORY_CARE_PROVIDER_SITE_OTHER): Payer: Medicaid Other

## 2015-07-11 ENCOUNTER — Encounter: Payer: Self-pay | Admitting: Family Medicine

## 2015-07-11 VITALS — BP 127/71 | HR 86 | Wt 213.0 lb

## 2015-07-11 DIAGNOSIS — O09213 Supervision of pregnancy with history of pre-term labor, third trimester: Secondary | ICD-10-CM | POA: Diagnosis present

## 2015-07-11 DIAGNOSIS — O09893 Supervision of other high risk pregnancies, third trimester: Secondary | ICD-10-CM

## 2015-07-18 ENCOUNTER — Ambulatory Visit (INDEPENDENT_AMBULATORY_CARE_PROVIDER_SITE_OTHER): Payer: Medicaid Other | Admitting: Family

## 2015-07-18 ENCOUNTER — Encounter: Payer: Self-pay | Admitting: Family

## 2015-07-18 VITALS — BP 115/69 | HR 86 | Temp 98.8°F | Wt 219.8 lb

## 2015-07-18 DIAGNOSIS — O09893 Supervision of other high risk pregnancies, third trimester: Secondary | ICD-10-CM

## 2015-07-18 DIAGNOSIS — O09213 Supervision of pregnancy with history of pre-term labor, third trimester: Secondary | ICD-10-CM | POA: Diagnosis not present

## 2015-07-18 DIAGNOSIS — O0993 Supervision of high risk pregnancy, unspecified, third trimester: Secondary | ICD-10-CM

## 2015-07-18 LAB — POCT URINALYSIS DIP (DEVICE)
BILIRUBIN URINE: NEGATIVE
Glucose, UA: NEGATIVE mg/dL
HGB URINE DIPSTICK: NEGATIVE
KETONES UR: 40 mg/dL — AB
Leukocytes, UA: NEGATIVE
Nitrite: NEGATIVE
PH: 7 (ref 5.0–8.0)
PROTEIN: NEGATIVE mg/dL
Specific Gravity, Urine: 1.02 (ref 1.005–1.030)
Urobilinogen, UA: 2 mg/dL — ABNORMAL HIGH (ref 0.0–1.0)

## 2015-07-18 NOTE — Progress Notes (Signed)
Breastfeeding tip of the week reviewed 17-p today 

## 2015-07-18 NOTE — Progress Notes (Signed)
Subjective:  Krystal Chan is a 30 y.o. [id number] at 561w6d being seen today for ongoing prenatal care.  She is currently monitored for the following issues for this high-risk pregnancy and has Supervision of high risk pregnancy, antepartum and History of preterm delivery, currently pregnant on her problem list.  Patient reports lower pelvic pressure and difficulty with ambulation due to fetal weight.  Contractions: Irritability. Vag. Bleeding: None.  Movement: Present. Denies leaking of fluid.   The following portions of the patient's history were reviewed and updated as appropriate: allergies, current medications, past family history, past medical history, past social history, past surgical history and problem list. Problem list updated.  Objective:   Filed Vitals:   07/18/15 1058  BP: 115/69  Pulse: 86  Temp: 98.8 F (37.1 C)  Weight: 219 lb 12.8 oz (99.701 kg)    Fetal Status: Fetal Heart Rate (bpm): 150 Fundal Height: 35 cm Movement: Present     General:  Alert, oriented and cooperative. Patient is in no acute distress.  Skin: Skin is warm and dry. No rash noted.   Cardiovascular: Normal heart rate noted  Respiratory: Normal respiratory effort, no problems with respiration noted  Abdomen: Soft, gravid, appropriate for gestational age. Pain/Pressure: Present     Pelvic: Vag. Bleeding: None     Cervical exam deferred        Extremities: Normal range of motion.  Edema: Trace  Mental Status: Normal mood and affect. Normal behavior. Normal judgment and thought content.   Urinalysis: Urine Protein: Negative Urine Glucose: Negative  Assessment and Plan:  Pregnancy: [id number] at 6361w6d  1. Supervision of high risk pregnancy, antepartum, third trimester - Continue monitoring  2. History of preterm delivery, currently pregnant, third trimester - 3717 today - Given letter to begin maternity leave per patient request  Preterm labor symptoms and general obstetric precautions including but not  limited to vaginal bleeding, contractions, leaking of fluid and fetal movement were reviewed in detail with the patient. Please refer to After Visit Summary for other counseling recommendations.  Return in about 1 week (around 07/25/2015) for 17p weekly.   Eino FarberWalidah Kennith GainN Karim, CNM

## 2015-07-22 ENCOUNTER — Encounter: Payer: Self-pay | Admitting: *Deleted

## 2015-07-25 ENCOUNTER — Ambulatory Visit (INDEPENDENT_AMBULATORY_CARE_PROVIDER_SITE_OTHER): Payer: Medicaid Other | Admitting: *Deleted

## 2015-07-25 VITALS — BP 130/67 | Ht 61.63 in | Wt 220.6 lb

## 2015-07-25 DIAGNOSIS — O09213 Supervision of pregnancy with history of pre-term labor, third trimester: Secondary | ICD-10-CM | POA: Diagnosis present

## 2015-07-25 DIAGNOSIS — O09893 Supervision of other high risk pregnancies, third trimester: Secondary | ICD-10-CM

## 2015-07-25 NOTE — Progress Notes (Signed)
Nutrition note: Pt has h/o obesity. Pt has gained 40.6# @ 4638w6d, which is > expected. Pt reports eating 3 meals & 1-2 snacks/d. Pt is taking a gummy multivitamin (gummy vitamins lack important minerals such as iron). Pt reports no N&V but has heartburn occ. Pt received verbal & written education on general nutrition during pregnancy. Encouraged pt to take a PNV or equivalent (2 chewable multivitamins). Discussed wt gain goals of 11-20# or 0.5#/wk. Pt agrees to take a PNV or equivalent. Pt has WIC & plans to BF. F/u as needed Blondell RevealLaura Kosha Jaquith, MS, RD, LDN, [facility]

## 2015-08-01 ENCOUNTER — Ambulatory Visit (INDEPENDENT_AMBULATORY_CARE_PROVIDER_SITE_OTHER): Payer: Medicaid Other | Admitting: Advanced Practice Midwife

## 2015-08-01 ENCOUNTER — Other Ambulatory Visit (HOSPITAL_COMMUNITY)
Admission: RE | Admit: 2015-08-01 | Discharge: 2015-08-01 | Disposition: A | Discharge status: Home / Self Care | Payer: Medicaid Other | Source: Ambulatory Visit | Attending: Advanced Practice Midwife | Admitting: Advanced Practice Midwife

## 2015-08-01 VITALS — BP 133/70 | HR 87 | Temp 98.4°F | Wt 218.8 lb

## 2015-08-01 DIAGNOSIS — Z113 Encounter for screening for infections with a predominantly sexual mode of transmission: Secondary | ICD-10-CM | POA: Insufficient documentation

## 2015-08-01 DIAGNOSIS — O0993 Supervision of high risk pregnancy, unspecified, third trimester: Secondary | ICD-10-CM

## 2015-08-01 DIAGNOSIS — O09213 Supervision of pregnancy with history of pre-term labor, third trimester: Secondary | ICD-10-CM

## 2015-08-01 LAB — POCT URINALYSIS DIP (DEVICE)
BILIRUBIN URINE: NEGATIVE
Glucose, UA: NEGATIVE mg/dL
LEUKOCYTES UA: NEGATIVE
NITRITE: NEGATIVE
Protein, ur: 30 mg/dL — AB
Specific Gravity, Urine: >=1.03 (ref 1.005–1.030)
Urobilinogen, UA: 4 mg/dL — ABNORMAL HIGH (ref 0.0–1.0)
pH: 6 (ref 5.0–8.0)

## 2015-08-01 LAB — OB RESULTS CONSOLE GBS: GBS: NEGATIVE

## 2015-08-01 LAB — OB RESULTS CONSOLE GC/CHLAMYDIA: Gonorrhea: NEGATIVE

## 2015-08-01 NOTE — Patient Instructions (Signed)
Braxton Hicks Contractions °Contractions of the uterus can occur throughout pregnancy. Contractions are not always a sign that you are in labor.  °WHAT ARE BRAXTON HICKS CONTRACTIONS?  °Contractions that occur before labor are called Braxton Hicks contractions, or false labor. Toward the end of pregnancy (32-34 weeks), these contractions can develop more often and may become more forceful. This is not true labor because these contractions do not result in opening (dilatation) and thinning of the cervix. They are sometimes difficult to tell apart from true labor because these contractions can be forceful and people have different pain tolerances. You should not feel embarrassed if you go to the hospital with false labor. Sometimes, the only way to tell if you are in true labor is for your health care provider to look for changes in the cervix. °If there are no prenatal problems or other health problems associated with the pregnancy, it is completely safe to be sent home with false labor and await the onset of true labor. °HOW CAN YOU TELL THE DIFFERENCE BETWEEN TRUE AND FALSE LABOR? °False Labor °· The contractions of false labor are usually shorter and not as hard as those of true labor.   °· The contractions are usually irregular.   °· The contractions are often felt in the front of the lower abdomen and in the groin.   °· The contractions may go away when you walk around or change positions while lying down.   °· The contractions get weaker and are shorter lasting as time goes on.   °· The contractions do not usually become progressively stronger, regular, and closer together as with true labor.   °True Labor °· Contractions in true labor last 30-70 seconds, become very regular, usually become more intense, and increase in frequency.   °· The contractions do not go away with walking.   °· The discomfort is usually felt in the top of the uterus and spreads to the lower abdomen and low back.   °· True labor can be  determined by your health care provider with an exam. This will show that the cervix is dilating and getting thinner.   °WHAT TO REMEMBER °· Keep up with your usual exercises and follow other instructions given by your health care provider.   °· Take medicines as directed by your health care provider.   °· Keep your regular prenatal appointments.   °· Eat and drink lightly if you think you are going into labor.   °· If Braxton Hicks contractions are making you uncomfortable:   °¨ Change your position from lying down or resting to walking, or from walking to resting.   °¨ Sit and rest in a tub of warm water.   °¨ Drink 2-3 glasses of water. Dehydration may cause these contractions.   °¨ Do slow and deep breathing several times an hour.   °WHEN SHOULD I SEEK IMMEDIATE MEDICAL CARE? °Seek immediate medical care if: °· Your contractions become stronger, more regular, and closer together.   °· You have fluid leaking or gushing from your vagina.   °· You have a fever.   °· You pass blood-tinged mucus.   °· You have vaginal bleeding.   °· You have continuous abdominal pain.   °· You have low back pain that you never had before.   °· You feel your baby's head pushing down and causing pelvic pressure.   °· Your baby is not moving as much as it used to.   °  °This information is not intended to replace advice given to you by your health care provider. Make sure you discuss any questions you have with your health care   provider. °  °Document Released: 04/27/2005 Document Revised: 05/02/2013 Document Reviewed: 02/06/2013 °Elsevier Interactive Patient Education ©2016 [employer] ° °

## 2015-08-01 NOTE — Progress Notes (Signed)
Breastfeeding tip of the week reviewed. 

## 2015-08-01 NOTE — Progress Notes (Signed)
Subjective:  Krystal Chan is a 30 y.o. [id number] at 155w6d being seen today for ongoing prenatal care.  She is currently monitored for the following issues for this high-risk pregnancy and has Supervision of high risk pregnancy, antepartum and History of preterm delivery, currently pregnant on her problem list.  Patient reports no complaints.  Contractions: Irritability. Vag. Bleeding: None.  Movement: Present. Denies leaking of fluid.   The following portions of the patient's history were reviewed and updated as appropriate: allergies, current medications, past family history, past medical history, past social history, past surgical history and problem list. Problem list updated.  Objective:   Filed Vitals:   08/01/15 1048  BP: 133/70  Pulse: 87  Temp: 98.4 F (36.9 C)  Weight: 218 lb 12.8 oz (99.247 kg)    Fetal Status: Fetal Heart Rate (bpm): 147   Movement: Present     General:  Alert, oriented and cooperative. Patient is in no acute distress.  Skin: Skin is warm and dry. No rash noted.   Cardiovascular: Normal heart rate noted  Respiratory: Normal respiratory effort, no problems with respiration noted  Abdomen: Soft, gravid, appropriate for gestational age. Pain/Pressure: Present     Pelvic: Vag. Bleeding: None     Cervical exam performed      0/0/ballotable   Extremities: Normal range of motion.  Edema: None  Mental Status: Normal mood and affect. Normal behavior. Normal judgment and thought content.   Urinalysis:    N/ tr  Assessment and Plan:  Pregnancy: [id number] at 755w6d  There are no diagnoses linked to this encounter. Term labor symptoms and general obstetric precautions including but not limited to vaginal bleeding, contractions, leaking of fluid and fetal movement were reviewed in detail with the patient. Please refer to After Visit Summary for other counseling recommendations.  F/U in 1 week   [place] Reeta Kuk, [place]

## 2015-08-02 LAB — GC/CHLAMYDIA PROBE AMP (~~LOC~~) NOT AT ARMC
CHLAMYDIA, DNA PROBE: NEGATIVE
NEISSERIA GONORRHEA: NEGATIVE

## 2015-08-04 ENCOUNTER — Encounter (HOSPITAL_COMMUNITY): Payer: Self-pay | Admitting: *Deleted

## 2015-08-04 ENCOUNTER — Inpatient Hospital Stay (HOSPITAL_COMMUNITY)
Admission: AD | Admit: 2015-08-04 | Discharge: 2015-08-04 | Disposition: A | Discharge status: Home / Self Care | Payer: Medicaid Other | Source: Ambulatory Visit | Attending: Family Medicine | Admitting: Family Medicine

## 2015-08-04 DIAGNOSIS — O368130 Decreased fetal movements, third trimester, not applicable or unspecified: Secondary | ICD-10-CM | POA: Diagnosis not present

## 2015-08-04 DIAGNOSIS — Z3A37 37 weeks gestation of pregnancy: Secondary | ICD-10-CM | POA: Insufficient documentation

## 2015-08-04 LAB — CULTURE, BETA STREP (GROUP B ONLY)

## 2015-08-04 NOTE — MAU Note (Signed)
Pt states normally feels fetal movement constantly.  States yesterday had slight decreased movement and last felt baby move around 0200 this morning.  Pt states has been staying hydrated and had breakfast this AM.

## 2015-08-04 NOTE — Discharge Instructions (Signed)
Fetal Movement Counts  Patient Name: __________________________________________________ Patient Due Date: ____________________  Performing a fetal movement count is highly recommended in high-risk pregnancies, but it is good for every pregnant woman to do. Your health care provider may ask you to start counting fetal movements at 28 weeks of the pregnancy. Fetal movements often increase:  · After eating a full meal.  · After physical activity.  · After eating or drinking something sweet or cold.  · At rest.  Pay attention to when you feel the baby is most active. This will help you notice a pattern of your baby's sleep and wake cycles and what factors contribute to an increase in fetal movement. It is important to perform a fetal movement count at the same time each day when your baby is normally most active.   HOW TO COUNT FETAL MOVEMENTS  1. Find a quiet and comfortable area to sit or lie down on your left side. Lying on your left side provides the best blood and oxygen circulation to your baby.  2. Write down the day and time on a sheet of paper or in a journal.  3. Start counting kicks, flutters, swishes, rolls, or jabs in a 2-hour period. You should feel at least 10 movements within 2 hours.  4. If you do not feel 10 movements in 2 hours, wait 2-3 hours and count again. Look for a change in the pattern or not enough counts in 2 hours.  SEEK MEDICAL CARE IF:  · You feel less than 10 counts in 2 hours, tried twice.  · There is no movement in over an hour.  · The pattern is changing or taking longer each day to reach 10 counts in 2 hours.  · You feel the baby is not moving as he or she usually does.  Date: ____________ Movements: ____________ Start time: ____________ Finish time: ____________   Date: ____________ Movements: ____________ Start time: ____________ Finish time: ____________  Date: ____________ Movements: ____________ Start time: ____________ Finish time: ____________  Date: ____________ Movements:  ____________ Start time: ____________ Finish time: ____________  Date: ____________ Movements: ____________ Start time: ____________ Finish time: ____________  Date: ____________ Movements: ____________ Start time: ____________ Finish time: ____________  Date: ____________ Movements: ____________ Start time: ____________ Finish time: ____________  Date: ____________ Movements: ____________ Start time: ____________ Finish time: ____________   Date: ____________ Movements: ____________ Start time: ____________ Finish time: ____________  Date: ____________ Movements: ____________ Start time: ____________ Finish time: ____________  Date: ____________ Movements: ____________ Start time: ____________ Finish time: ____________  Date: ____________ Movements: ____________ Start time: ____________ Finish time: ____________  Date: ____________ Movements: ____________ Start time: ____________ Finish time: ____________  Date: ____________ Movements: ____________ Start time: ____________ Finish time: ____________  Date: ____________ Movements: ____________ Start time: ____________ Finish time: ____________   Date: ____________ Movements: ____________ Start time: ____________ Finish time: ____________  Date: ____________ Movements: ____________ Start time: ____________ Finish time: ____________  Date: ____________ Movements: ____________ Start time: ____________ Finish time: ____________  Date: ____________ Movements: ____________ Start time: ____________ Finish time: ____________  Date: ____________ Movements: ____________ Start time: ____________ Finish time: ____________  Date: ____________ Movements: ____________ Start time: ____________ Finish time: ____________  Date: ____________ Movements: ____________ Start time: ____________ Finish time: ____________   Date: ____________ Movements: ____________ Start time: ____________ Finish time: ____________  Date: ____________ Movements: ____________ Start time: ____________ Finish  time: ____________  Date: ____________ Movements: ____________ Start time: ____________ Finish time: ____________  Date: ____________ Movements: ____________ Start time:   ____________ Finish time: ____________  Date: ____________ Movements: ____________ Start time: ____________ Finish time: ____________  Date: ____________ Movements: ____________ Start time: ____________ Finish time: ____________  Date: ____________ Movements: ____________ Start time: ____________ Finish time: ____________   Date: ____________ Movements: ____________ Start time: ____________ Finish time: ____________  Date: ____________ Movements: ____________ Start time: ____________ Finish time: ____________  Date: ____________ Movements: ____________ Start time: ____________ Finish time: ____________  Date: ____________ Movements: ____________ Start time: ____________ Finish time: ____________  Date: ____________ Movements: ____________ Start time: ____________ Finish time: ____________  Date: ____________ Movements: ____________ Start time: ____________ Finish time: ____________  Date: ____________ Movements: ____________ Start time: ____________ Finish time: ____________   Date: ____________ Movements: ____________ Start time: ____________ Finish time: ____________  Date: ____________ Movements: ____________ Start time: ____________ Finish time: ____________  Date: ____________ Movements: ____________ Start time: ____________ Finish time: ____________  Date: ____________ Movements: ____________ Start time: ____________ Finish time: ____________  Date: ____________ Movements: ____________ Start time: ____________ Finish time: ____________  Date: ____________ Movements: ____________ Start time: ____________ Finish time: ____________  Date: ____________ Movements: ____________ Start time: ____________ Finish time: ____________   Date: ____________ Movements: ____________ Start time: ____________ Finish time: ____________  Date: ____________  Movements: ____________ Start time: ____________ Finish time: ____________  Date: ____________ Movements: ____________ Start time: ____________ Finish time: ____________  Date: ____________ Movements: ____________ Start time: ____________ Finish time: ____________  Date: ____________ Movements: ____________ Start time: ____________ Finish time: ____________  Date: ____________ Movements: ____________ Start time: ____________ Finish time: ____________  Date: ____________ Movements: ____________ Start time: ____________ Finish time: ____________   Date: ____________ Movements: ____________ Start time: ____________ Finish time: ____________  Date: ____________ Movements: ____________ Start time: ____________ Finish time: ____________  Date: ____________ Movements: ____________ Start time: ____________ Finish time: ____________  Date: ____________ Movements: ____________ Start time: ____________ Finish time: ____________  Date: ____________ Movements: ____________ Start time: ____________ Finish time: ____________  Date: ____________ Movements: ____________ Start time: ____________ Finish time: ____________     This information is not intended to replace advice given to you by your health care provider. Make sure you discuss any questions you have with your health care provider.     Document Released: 05/27/2006 Document Revised: 05/18/2014 Document Reviewed: 02/22/2012  Elsevier Interactive Patient Education ©2016 Else[employer] Inc.

## 2015-08-04 NOTE — MAU Provider Note (Signed)
History   [id number] @ 37.2 wks in with decreased fetal movement.  CSN: [id number]  Arrival date & time 08/04/15  1148   None     Chief Complaint  Patient presents with  . Decreased Fetal Movement    HPI  History reviewed. No pertinent past medical history.  Past Surgical History  Procedure Laterality Date  . Hernia repair      gastic hernia 2005 and umbilical hernia 2004    Family History  Problem Relation Age of Onset  . Breast cancer Maternal Grandmother   . Hypertension Maternal Grandmother   . Cancer Maternal Grandmother   . Diabetes Maternal Grandfather   . Hypertension Mother   . Heart disease Mother   . Hypertension Paternal Grandmother   . Diabetes Paternal Grandmother   . Hearing loss Neg Hx     Social History  Substance Use Topics  . Smoking status: Never Smoker   . Smokeless tobacco: Never Used  . Alcohol Use: No     Comment: ocassionally    OB History    Gravida Para Term Preterm AB TAB SAB Ectopic Multiple Living   5 2 1 1 2 2    2       Review of Systems  Constitutional: Negative.   HENT: Negative.   Eyes: Negative.   Respiratory: Negative.   Cardiovascular: Negative.   Gastrointestinal: Negative.   Endocrine: Negative.   Genitourinary: Negative.   Musculoskeletal: Negative.   Skin: Negative.   Allergic/Immunologic: Negative.   Neurological: Negative.   Hematological: Negative.   Psychiatric/Behavioral: Negative.     Allergies  Review of patient's allergies indicates no known allergies.  Home Medications  No current outpatient prescriptions on file.  BP 136/80 mmHg  Pulse 99  Temp(Src) 98.2 F (36.8 C) (Oral)  Resp 16  LMP 11/16/2014 (Exact Date)  Physical Exam  Constitutional: She is oriented to person, place, and time. She appears well-developed and well-nourished.  HENT:  Head: Normocephalic.  Eyes: Pupils are equal, round, and reactive to light.  Cardiovascular: Normal rate, regular rhythm, normal heart sounds and  intact distal pulses.   Pulmonary/Chest: Effort normal and breath sounds normal.  Abdominal: Soft. Bowel sounds are normal.  Genitourinary: Vagina normal and uterus normal.  Musculoskeletal: Normal range of motion.  Neurological: She is alert and oriented to person, place, and time. She has normal reflexes.  Skin: Skin is warm and dry.  Psychiatric: She has a normal mood and affect. Her behavior is normal. Judgment and thought content normal.    MAU Course  Procedures (including critical care time)  Labs Reviewed - No data to display No results found.   No diagnosis found.    MDM  FHR pattern reactive, with 15x 15 accels, no decels. Will d/c home

## 2015-08-08 ENCOUNTER — Ambulatory Visit (INDEPENDENT_AMBULATORY_CARE_PROVIDER_SITE_OTHER): Payer: Medicaid Other | Admitting: Obstetrics and Gynecology

## 2015-08-08 ENCOUNTER — Encounter: Payer: Self-pay | Admitting: Obstetrics and Gynecology

## 2015-08-08 VITALS — BP 135/74 | HR 84 | Temp 97.9°F | Wt 221.5 lb

## 2015-08-08 DIAGNOSIS — O0993 Supervision of high risk pregnancy, unspecified, third trimester: Secondary | ICD-10-CM

## 2015-08-08 DIAGNOSIS — O09893 Supervision of other high risk pregnancies, third trimester: Secondary | ICD-10-CM

## 2015-08-08 DIAGNOSIS — O09213 Supervision of pregnancy with history of pre-term labor, third trimester: Secondary | ICD-10-CM

## 2015-08-08 NOTE — Progress Notes (Signed)
Subjective:  Krystal Chan is a 30 y.o. [id number] at 2774w6d being seen today for ongoing prenatal care.  She is currently monitored for the following issues for this high-risk pregnancy and has Supervision of high risk pregnancy, antepartum and History of preterm delivery, currently pregnant on her problem list.  Patient reports no complaints.  Contractions: Irritability.  .  Movement: Present. Denies leaking of fluid.   The following portions of the patient's history were reviewed and updated as appropriate: allergies, current medications, past family history, past medical history, past social history, past surgical history and problem list. Problem list updated.  Objective:   Filed Vitals:   08/08/15 1015  BP: 135/74  Pulse: 84  Temp: 97.9 F (36.6 C)  Weight: 221 lb 8 oz (100.472 kg)    Fetal Status: Fetal Heart Rate (bpm): 152 Fundal Height: 40 cm Movement: Present     General:  Alert, oriented and cooperative. Patient is in no acute distress.  Skin: Skin is warm and dry. No rash noted.   Cardiovascular: Normal heart rate noted  Respiratory: Normal respiratory effort, no problems with respiration noted  Abdomen: Soft, gravid, appropriate for gestational age. Pain/Pressure: Present     Pelvic:       Cervical exam performed Dilation: Closed Effacement (%): Thick Station: Ballotable  Extremities: Normal range of motion.  Edema: None  Mental Status: Normal mood and affect. Normal behavior. Normal judgment and thought content.   Urinalysis:      Assessment and Plan:  Pregnancy: [id number] at 5674w6d  1. History of preterm delivery, currently pregnant, third trimester   2. Supervision of high risk pregnancy, antepartum, third trimester Patient is doing well   Term labor symptoms and general obstetric precautions including but not limited to vaginal bleeding, contractions, leaking of fluid and fetal movement were reviewed in detail with the patient. Please refer to After Visit Summary  for other counseling recommendations.  Return in about 1 week (around 08/15/2015).   Catalina AntiguaPeggy Ronnetta Currington, MD

## 2015-08-09 ENCOUNTER — Inpatient Hospital Stay (HOSPITAL_COMMUNITY)
Admission: AD | Admit: 2015-08-09 | Discharge: 2015-08-11 | DRG: 775 | Disposition: A | Discharge status: Home / Self Care | Payer: Medicaid Other | Source: Ambulatory Visit | Attending: Obstetrics & Gynecology | Admitting: Obstetrics & Gynecology

## 2015-08-09 ENCOUNTER — Inpatient Hospital Stay (HOSPITAL_COMMUNITY): Payer: Medicaid Other | Admitting: Anesthesiology

## 2015-08-09 ENCOUNTER — Encounter (HOSPITAL_COMMUNITY): Payer: Self-pay | Admitting: *Deleted

## 2015-08-09 DIAGNOSIS — Z8249 Family history of ischemic heart disease and other diseases of the circulatory system: Secondary | ICD-10-CM

## 2015-08-09 DIAGNOSIS — O4202 Full-term premature rupture of membranes, onset of labor within 24 hours of rupture: Secondary | ICD-10-CM | POA: Diagnosis present

## 2015-08-09 DIAGNOSIS — IMO0001 Reserved for inherently not codable concepts without codable children: Secondary | ICD-10-CM

## 2015-08-09 DIAGNOSIS — Z833 Family history of diabetes mellitus: Secondary | ICD-10-CM

## 2015-08-09 DIAGNOSIS — Z3A38 38 weeks gestation of pregnancy: Secondary | ICD-10-CM

## 2015-08-09 DIAGNOSIS — Z803 Family history of malignant neoplasm of breast: Secondary | ICD-10-CM | POA: Diagnosis not present

## 2015-08-09 LAB — CBC
HCT: 33 % — ABNORMAL LOW (ref 36.0–46.0)
Hemoglobin: 10.6 g/dL — ABNORMAL LOW (ref 12.0–15.0)
MCH: 27.1 pg (ref 26.0–34.0)
MCHC: 32.1 g/dL (ref 30.0–36.0)
MCV: 84.4 fL (ref 78.0–100.0)
PLATELETS: 164 10*3/uL (ref 150–400)
RBC: 3.91 MIL/uL (ref 3.87–5.11)
RDW: 16.2 % — ABNORMAL HIGH (ref 11.5–15.5)
WBC: 9.7 10*3/uL (ref 4.0–10.5)

## 2015-08-09 LAB — TYPE AND SCREEN
ABO/RH(D): A POS
ANTIBODY SCREEN: NEGATIVE

## 2015-08-09 LAB — POCT FERN TEST: POCT Fern Test: POSITIVE

## 2015-08-09 MED ORDER — PHENYLEPHRINE 40 MCG/ML (10ML) SYRINGE FOR IV PUSH (FOR BLOOD PRESSURE SUPPORT)
80.0000 ug | PREFILLED_SYRINGE | INTRAVENOUS | Status: DC | PRN
  Filled 2015-08-09: qty 20
  Filled 2015-08-09: qty 2

## 2015-08-09 MED ORDER — OXYTOCIN BOLUS FROM INFUSION: 500.0000 mL | INTRAVENOUS | Status: DC

## 2015-08-09 MED ORDER — EPHEDRINE 5 MG/ML INJ: 10.0000 mg | INTRAVENOUS | Status: DC | PRN

## 2015-08-09 MED ORDER — DIPHENHYDRAMINE HCL 50 MG/ML IJ SOLN: 12.5000 mg | INTRAMUSCULAR | Status: DC | PRN

## 2015-08-09 MED ORDER — LACTATED RINGERS IV SOLN
INTRAVENOUS | Status: DC
  Administered 2015-08-09: 15:00:00 via INTRAUTERINE

## 2015-08-09 MED ORDER — LACTATED RINGERS IV SOLN: 500.0000 mL | Freq: Once | INTRAVENOUS | Status: DC

## 2015-08-09 MED ORDER — OXYCODONE-ACETAMINOPHEN 5-325 MG PO TABS
1.0000 | ORAL_TABLET | ORAL | Status: DC | PRN
Start: 2015-08-09 — End: 2015-08-10

## 2015-08-09 MED ORDER — FLEET ENEMA 7-19 GM/118ML RE ENEM: 1.0000 | ENEMA | RECTAL | Status: DC | PRN

## 2015-08-09 MED ORDER — TERBUTALINE SULFATE 1 MG/ML IJ SOLN
0.2500 mg | Freq: Once | INTRAMUSCULAR | Status: DC | PRN
  Filled 2015-08-09: qty 1

## 2015-08-09 MED ORDER — ACETAMINOPHEN 325 MG PO TABS: 650.0000 mg | ORAL_TABLET | ORAL | Status: DC | PRN

## 2015-08-09 MED ORDER — ONDANSETRON HCL 4 MG/2ML IJ SOLN: 4.0000 mg | Freq: Four times a day (QID) | INTRAMUSCULAR | Status: DC | PRN

## 2015-08-09 MED ORDER — PHENYLEPHRINE 40 MCG/ML (10ML) SYRINGE FOR IV PUSH (FOR BLOOD PRESSURE SUPPORT)
80.0000 ug | PREFILLED_SYRINGE | INTRAVENOUS | Status: DC | PRN
  Filled 2015-08-09: qty 2

## 2015-08-09 MED ORDER — LACTATED RINGERS IV SOLN
INTRAVENOUS | Status: DC
  Administered 2015-08-09 (×2): via INTRAVENOUS

## 2015-08-09 MED ORDER — OXYCODONE-ACETAMINOPHEN 5-325 MG PO TABS: 2.0000 | ORAL_TABLET | ORAL | Status: DC | PRN

## 2015-08-09 MED ORDER — LIDOCAINE HCL (PF) 1 % IJ SOLN
30.0000 mL | INTRAMUSCULAR | Status: DC | PRN
  Filled 2015-08-09: qty 30

## 2015-08-09 MED ORDER — LACTATED RINGERS IV SOLN
500.0000 mL | INTRAVENOUS | Status: DC | PRN
  Administered 2015-08-09 (×2): 1000 mL via INTRAVENOUS

## 2015-08-09 MED ORDER — OXYTOCIN 10 UNIT/ML IJ SOLN
1.0000 m[IU]/min | INTRAVENOUS | Status: DC
  Administered 2015-08-09: 2 m[IU]/min via INTRAVENOUS

## 2015-08-09 MED ORDER — OXYTOCIN 10 UNIT/ML IJ SOLN
2.5000 [IU]/h | INTRAVENOUS | Status: DC
  Filled 2015-08-09: qty 4

## 2015-08-09 MED ORDER — LIDOCAINE HCL (PF) 1 % IJ SOLN
INTRAMUSCULAR | Status: DC | PRN
  Administered 2015-08-09 (×2): 4 mL via EPIDURAL

## 2015-08-09 MED ORDER — FENTANYL 2.5 MCG/ML BUPIVACAINE 1/10 % EPIDURAL INFUSION (WH - ANES)
14.0000 mL/h | INTRAMUSCULAR | Status: DC | PRN
  Administered 2015-08-09 (×2): 14 mL/h via EPIDURAL
  Administered 2015-08-09: 12.5 mL/h via EPIDURAL
  Filled 2015-08-09 (×2): qty 125

## 2015-08-09 MED ORDER — FENTANYL CITRATE (PF) 100 MCG/2ML IJ SOLN: 50.0000 ug | INTRAMUSCULAR | Status: DC | PRN

## 2015-08-09 MED ORDER — IBUPROFEN 600 MG PO TABS
600.0000 mg | ORAL_TABLET | Freq: Four times a day (QID) | ORAL | Status: DC
  Administered 2015-08-09 – 2015-08-11 (×7): 600 mg via ORAL
  Filled 2015-08-09 (×7): qty 1

## 2015-08-09 MED ORDER — CITRIC ACID-SODIUM CITRATE 334-500 MG/5ML PO SOLN: 30.0000 mL | ORAL | Status: DC | PRN

## 2015-08-09 NOTE — Progress Notes (Signed)
LABOR PROGRESS NOTE  Joice Loftsikita Rasmussen is a 30 y.o. [id number] at 4033w0d  admitted for prom  Subjective: No pain  Objective: BP 130/66 mmHg  Pulse 88  Temp(Src) 98.8 F (37.1 C) (Oral)  Resp 20  Ht 5\' 1"  (1.549 m)  Wt 213 lb (96.616 kg)  BMI 40.27 kg/m2  SpO2 100%  LMP 11/16/2014 (Exact Date) or  Filed Vitals:   08/09/15 1100 08/09/15 1130 08/09/15 1200 08/09/15 1230  BP: 130/74 127/70 123/70 130/66  Pulse: 84 80 83 88  Temp:   98.8 F (37.1 C)   TempSrc:   Oral   Resp: 20 20 18 20   Height:      Weight:      SpO2:        140/mod/-a/intermittent late and variable decels  Dilation: 6 Effacement (%): 100 Cervical Position: Anterior Station: -2 Presentation: Vertex Exam by:: Enis SlipperJane Bailey, RN  Labs: Lab Results  Component Value Date   WBC 9.7 08/09/2015   HGB 10.6* 08/09/2015   HCT 33.0* 08/09/2015   MCV 84.4 08/09/2015   PLT 164 08/09/2015    Patient Active Problem List   Diagnosis Date Noted  . Active labor 08/09/2015  . Supervision of high risk pregnancy, antepartum 02/28/2015  . History of preterm delivery, currently pregnant 02/28/2015    Assessment / Plan: 30 y.o. [id number] at 6633w0d here for prom  Labor: minimal progress last 4-6 hours. Starting pitocin Fetal Wellbeing:  Cat 2. Unclear whether bulk of decels are late/variables or early decels. IUPC placed to help characterize, and to titrage pitocin.  Pain Control:  S/p epidural Anticipated MOD:  vaginal  Silvano BilisNoah B Catrinia Racicot, MD 08/09/2015, 1:03 PM

## 2015-08-09 NOTE — Progress Notes (Signed)
Krystal Chan is a 30 y.o. [id number] at 7310w0d admitted for active labor, rupture of membranes  Subjective: Patient laying in bed comfortably. No complaints at this time.   Objective: BP 125/63 mmHg  Pulse 94  Temp(Src) 98.8 F (37.1 C) (Oral)  Resp 20  Ht 5\' 1"  (1.549 m)  Wt 96.616 kg (213 lb)  BMI 40.27 kg/m2  SpO2 100%  LMP 11/16/2014 (Exact Date)   Total I/O In: -  Out: 200 [Urine:200]  FHT:  FHR: 120 bpm, variability: moderate,  accelerations:  Present,  decelerations:  Present variable and some early UC:   regular, every 2.5 - 4 minutes SVE:   Dilation: 8.5 Effacement (%): 90 Station: -2, -3 Exam by:: Dr. Arbie Chan, Krystal Bailey, RN  Labs: Lab Results  Component Value Date   WBC 9.7 08/09/2015   HGB 10.6* 08/09/2015   HCT 33.0* 08/09/2015   MCV 84.4 08/09/2015   PLT 164 08/09/2015    Assessment / Plan: Spontaneous labor, progressing normally. Making adequate change. Variable and some early decels are present.   Labor: Progressing on Pitocin Fetal Wellbeing:  Category II; mostly variable and some early decels present. Will try positional changes. Discussed with Dr. Ashok Chan, will begin amnioinfusion at this time.  Pain Control:  Epidural I/D:  GBS neg  Anticipated MOD:  NSVD  Krystal DinningChristina M Judson Chan 08/09/2015, 3:09 PM

## 2015-08-09 NOTE — MAU Note (Signed)
Pt presents complaining of SROM at 0515. Some irregular contractions. Denies bleeding. Reports good fetal movement.

## 2015-08-09 NOTE — Anesthesia Procedure Notes (Signed)
Epidural Patient location during procedure: OB Start time: 08/09/2015 8:46 AM  Staffing Anesthesiologist: Mal AmabileFOSTER, Lemma Tetro Performed by: anesthesiologist   Preanesthetic Checklist Completed: patient identified, site marked, surgical consent, pre-op evaluation, timeout performed, IV checked, risks and benefits discussed and monitors and equipment checked  Epidural Patient position: sitting Prep: site prepped and draped and DuraPrep Patient monitoring: continuous pulse ox and blood pressure Approach: midline Location: L3-L4 Injection technique: LOR air  Needle:  Needle type: Tuohy  Needle gauge: 17 G Needle length: 9 cm and 9 Needle insertion depth: 6 cm Catheter type: closed end flexible Catheter size: 19 Gauge Catheter at skin depth: 11 cm Test dose: negative and Other  Assessment Events: blood not aspirated, injection not painful, no injection resistance, negative IV test and no paresthesia  Additional Notes Patient identified. Risks and benefits discussed including failed block, incomplete  Pain control, post dural puncture headache, nerve damage, paralysis, blood pressure Changes, nausea, vomiting, reactions to medications-both toxic and allergic and post Partum back pain. All questions were answered. Patient expressed understanding and wished to proceed. Sterile technique was used throughout procedure. Epidural site was Dressed with sterile barrier dressing. No paresthesias, signs of intravascular injection Or signs of intrathecal spread were encountered. Attempt x 2. Patient was more comfortable after the epidural was dosed. Please see RN's note for documentation of vital signs and FHR which are stable.

## 2015-08-09 NOTE — H&P (Signed)
Krystal Chan is a [age] y.o. female [phone] with IUP at 43103w0d presenting for ROM at 0515, clear fluids. Pt states she has been having irregular, every 3-5 minutes contractions, associated with none vaginal bleeding for a few hours..  Membranes are ruptured, clear fluid, with active fetal movement.   [facility]  Prenatal History/Complications:  Hx PTD, on 17p  Past Medical History: No past medical history on file.  Past Surgical History: Past Surgical History  Procedure Laterality Date  . Hernia repair      gastic hernia 2005 and umbilical hernia 2004    Obstetrical History: OB History    Gravida Para Term Preterm AB TAB SAB Ectopic Multiple Living   5 2 1 1 2 2    2        Social History: Social History   Social History  . Marital Status: Divorced    Spouse Name: N/A  . Number of Children: N/A  . Years of Education: N/A   Social History Main Topics  . Smoking status: Never Smoker   . Smokeless tobacco: Never Used  . Alcohol Use: No     Comment: ocassionally  . Drug Use: No  . Sexual Activity: Not Currently   Other Topics Concern  . Not on file   Social History Narrative    Family History: Family History  Problem Relation Age of Onset  . Breast cancer Maternal Grandmother   . Hypertension Maternal Grandmother   . Cancer Maternal Grandmother   . Diabetes Maternal Grandfather   . Hypertension Mother   . Heart disease Mother   . Hypertension Paternal Grandmother   . Diabetes Paternal Grandmother   . Hearing loss Neg Hx     Allergies: No Known Allergies  Prescriptions prior to admission  Medication Sig Dispense Refill Last Dose  . Acetaminophen (TYLENOL PO) Take 2 tablets by mouth every 6 (six) hours as needed (headaches and pain).    Taking  . Prenatal Vit-Min-FA-Fish Oil ([employer] PRENATAL GUMMY PO) Take 2 tablets by mouth daily.   Taking     Prenatal Transfer Tool  Maternal Diabetes: No Genetic Screening: Normal Maternal  Ultrasounds/Referrals: Normal Fetal Ultrasounds or other Referrals:  None Maternal Substance Abuse:  No Significant Maternal Medications:  None Significant Maternal Lab Results: Lab values include: Group B Strep negative     Review of Systems   Constitutional: Negative for fever and chills Eyes: Negative for visual disturbances Respiratory: Negative for shortness of breath, dyspnea Cardiovascular: Negative for chest pain or palpitations  Gastrointestinal: Negative for vomiting, diarrhea and constipation.  POSITIVE for abdominal pain (contractions) Genitourinary: Negative for dysuria and urgency Musculoskeletal: Negative for back pain, joint pain, myalgias  Neurological: Negative for dizziness and headaches      Blood pressure 130/63, pulse 91, temperature 98.5 F (36.9 C), temperature source Oral, resp. rate 18, height 5\' 1"  (1.549 m), weight 96.616 kg (213 lb), last menstrual period 11/16/2014, unknown if currently breastfeeding. General appearance: alert, cooperative and no distress Lungs: clear to auscultation bilaterally Heart: regular rate and rhythm Abdomen: soft, non-tender; bowel sounds normal Pelvic: leaking clear fluid Extremities: Homans sign is negative, no sign of DVT DTR's 2+ Presentation: cephalic Fetal monitoring  Baseline: 150 bpm, Variability: Good {> 6 bpm), Accelerations: Reactive and Decelerations: Early Uterine activity  q 3 minutes  Dilation: 4 Effacement (%): 80 Station: -3 Exam by:: Leta JunglingEmilie Siska, RN   Cx was LTC in office yesterday   Prenatal labs: ABO, Rh: A/Positive/-- (10/03  0000) Antibody: Negative (10/03 0000) Rubella: !Error! RPR: NON REAC (01/26 1213)  HBsAg: Negative (10/03 0000)  HIV: NONREACTIVE (01/26 1213)    Results for orders placed or performed during the hospital encounter of 08/09/15 (from the past 24 hour(s))  POCT fern test   Collection Time: 08/09/15  6:09 AM  Result Value Ref Range   POCT Fern Test Positive =  ruptured amniotic membanes     Clinic HD --> HRC Prenatal Labs  Dating LMP, consistent with 9 wk ultrasound Blood type: A/Positive/-- (10/03 0000)   Genetic Screen  Quad: Neg Antibody:Negative (10/03 0000)  Anatomic [name]  18 wks nml Rubella: Immune (10/03 0000)  GTT  Third trimester: 109 RPR: Nonreactive (10/03 0000)   Flu vaccine  06/06/15 HBsAg: Negative (10/03 0000)   TDaP vaccine  06/06/15                                           HIV: Non Reactive (09/05 1643)   Baby Food  breast                                           GBS: Neg  Contraception  uncertain Pap: Negative  Circumcision  female   Pediatrician  [facility]   Support Person Sonterra      Assessment: Krystal Chan is a 30 y.o. [id number] with an IUP at [redacted]w[redacted]d presenting for ROM/active labor  Plan: #Labor: expectant management #Pain:  Per request #FWB Cat 1    CRESENZO-DISHMAN,Tarini Carrier 08/09/2015, 6:33 AM

## 2015-08-10 LAB — RPR: RPR: NONREACTIVE

## 2015-08-10 MED ORDER — ACETAMINOPHEN 325 MG PO TABS
650.0000 mg | ORAL_TABLET | ORAL | Status: DC | PRN
  Administered 2015-08-10: 650 mg via ORAL
  Filled 2015-08-10: qty 2

## 2015-08-10 MED ORDER — BENZOCAINE-MENTHOL 20-0.5 % EX AERO: 1.0000 "application " | INHALATION_SPRAY | CUTANEOUS | Status: DC | PRN

## 2015-08-10 MED ORDER — DIPHENHYDRAMINE HCL 25 MG PO CAPS: 25.0000 mg | ORAL_CAPSULE | Freq: Four times a day (QID) | ORAL | Status: DC | PRN

## 2015-08-10 MED ORDER — TETANUS-DIPHTH-ACELL PERTUSSIS 5-2.5-18.5 LF-MCG/0.5 IM SUSP: 0.5000 mL | Freq: Once | INTRAMUSCULAR | Status: DC

## 2015-08-10 MED ORDER — ONDANSETRON HCL 4 MG/2ML IJ SOLN: 4.0000 mg | INTRAMUSCULAR | Status: DC | PRN

## 2015-08-10 MED ORDER — WITCH HAZEL-GLYCERIN EX PADS: 1.0000 "application " | MEDICATED_PAD | CUTANEOUS | Status: DC | PRN

## 2015-08-10 MED ORDER — ONDANSETRON HCL 4 MG PO TABS: 4.0000 mg | ORAL_TABLET | ORAL | Status: DC | PRN

## 2015-08-10 MED ORDER — PRENATAL MULTIVITAMIN CH
1.0000 | ORAL_TABLET | Freq: Every day | ORAL | Status: DC
  Administered 2015-08-10 – 2015-08-11 (×2): 1 via ORAL
  Filled 2015-08-10 (×2): qty 1

## 2015-08-10 MED ORDER — SIMETHICONE 80 MG PO CHEW
80.0000 mg | CHEWABLE_TABLET | ORAL | Status: DC | PRN
  Administered 2015-08-10 (×3): 80 mg via ORAL
  Filled 2015-08-10 (×2): qty 1

## 2015-08-10 MED ORDER — BISACODYL 10 MG RE SUPP
10.0000 mg | Freq: Every day | RECTAL | Status: DC | PRN
  Administered 2015-08-10: 10 mg via RECTAL
  Filled 2015-08-10: qty 1

## 2015-08-10 MED ORDER — OXYCODONE-ACETAMINOPHEN 5-325 MG PO TABS
1.0000 | ORAL_TABLET | ORAL | Status: DC | PRN
  Administered 2015-08-10: 2 via ORAL
  Administered 2015-08-10 (×2): 1 via ORAL
  Filled 2015-08-10: qty 2
  Filled 2015-08-10 (×2): qty 1

## 2015-08-10 MED ORDER — LANOLIN HYDROUS EX OINT: TOPICAL_OINTMENT | CUTANEOUS | Status: DC | PRN

## 2015-08-10 MED ORDER — DIBUCAINE 1 % RE OINT: 1.0000 "application " | TOPICAL_OINTMENT | RECTAL | Status: DC | PRN

## 2015-08-10 MED ORDER — SENNOSIDES-DOCUSATE SODIUM 8.6-50 MG PO TABS
2.0000 | ORAL_TABLET | ORAL | Status: DC
  Administered 2015-08-11: 2 via ORAL
  Filled 2015-08-10: qty 2

## 2015-08-10 MED ORDER — POLYETHYLENE GLYCOL 3350 17 G PO PACK
17.0000 g | PACK | Freq: Every day | ORAL | Status: DC
  Filled 2015-08-10 (×2): qty 1

## 2015-08-10 MED ORDER — ZOLPIDEM TARTRATE 5 MG PO TABS: 5.0000 mg | ORAL_TABLET | Freq: Every evening | ORAL | Status: DC | PRN

## 2015-08-10 NOTE — Lactation Note (Signed)
This note was copied from a baby's chart. Lactation Consultation Note  Patient Name: Krystal Chan Reason for consult: Initial assessment   Initial consult on 20 hour old infant with 1 time BF mom. Infant with 5 BF for 10-20 minutes, 2 attempts, 3 voids and 1 stool since birth. Mom reports infant is sleepy at times, reviewed awakening techniques with mom.   Mom denies nipple pain. She reports infant prefers to latch to left breast. Left nipple is more everted that right nipple. Mom with large soft compressible breasts/areola areas. Small gtts colostrum noted to both sides with hand expression.   Mom requested assistance with feeding, infant last fed at 2 pm. Awakened infant to feed. Infant latched to right breast in football hold. She displayed rhythmic intermittent suckles and intermittent swallows. She did need stimulation to maintain suckling, encouraged parents to use awakening techniques as needed to keep infant awake for feeding.  Reviewed stomach size, NL NB Feeding behavior, cluster feeding, colostrum and milk coming to volume, I/O. Mom is maintaining a feeding log.    BF Resources Handout and LC Brochure given, informed mom of OP Services, LC phone # and BF Support Groups. [facility] and plans to call and make an OP Appointment.  Enc mom to call with questions/concerns prn.    Maternal Data Formula Feeding for Exclusion: No Has patient been taught Hand Expression?: Yes Does the patient have breastfeeding experience prior to this delivery?: No (Did not BF first 2 children who are 13 and 15)  Feeding Feeding Type: Breast Fed Length of feed: 15 min  LATCH Score/Interventions Latch: Repeated attempts needed to sustain latch, nipple held in mouth throughout feeding, stimulation needed to elicit sucking reflex. Intervention(s): Adjust position;Assist with latch;Breast massage;Breast compression  Audible Swallowing: A few with  stimulation Intervention(s): Hand expression Intervention(s): Alternate breast massage  Type of Nipple: Flat (everts with stimulation)  Comfort (Breast/Nipple): Soft / non-tender     Hold (Positioning): Assistance needed to correctly position infant at breast and maintain latch. Intervention(s): Breastfeeding basics reviewed;Support Pillows;Position options;Skin to skin  LATCH Score: 6  Lactation Tools Discussed/Used WIC Program: Yes   Consult Status Consult Status: Follow-up Date: 08/11/15 Follow-up type: In-patient    Krystal Chan Chan, 5:25 PM

## 2015-08-10 NOTE — Anesthesia Postprocedure Evaluation (Signed)
Anesthesia Post Note  Patient: Krystal Chan  Procedure(s) Performed: * No procedures listed *  Patient location during evaluation: Mother Baby Anesthesia Type: Epidural Level of consciousness: awake and alert and oriented Pain management: satisfactory to patient Vital Signs Assessment: post-procedure vital signs reviewed and stable Respiratory status: spontaneous breathing and nonlabored ventilation Cardiovascular status: stable Postop Assessment: no headache, no backache, no signs of nausea or vomiting, adequate PO intake and patient able to bend at knees (patient up walking) Anesthetic complications: no    Last Vitals:  Filed Vitals:   08/10/15 0248 08/10/15 0515  BP: 130/73 137/87  Pulse: 89 92  Temp: 36.9 C   Resp: 18 18    Last Pain:  Filed Vitals:   08/10/15 0645  PainSc: 8                  Evelyn Moch

## 2015-08-10 NOTE — Progress Notes (Addendum)
Post Partum Day #1 Subjective:  Krystal Chan is a 30 y.o. [id number] who is reporting diffuse abdominal pain.  She reports that she has not had a BM yet since before delivery.  Pain is stabbing.  Not passing flatus.  She notes that the Motrin and Percocet are not helping with pain.  She denies vaginal pain or excessive vaginal bleeding.   Objective: Blood pressure 126/79, pulse 83, temperature 98.7 F (37.1 C), temperature source Oral, resp. rate 20, height 5\' 1"  (1.549 m), weight 213 lb (96.616 kg), last menstrual period 11/16/2014, SpO2 99 %, unknown if currently breastfeeding.  Physical Exam:  General: alert, cooperative and no distress Lochia:normal flow Chest: CTAB, normal WOB on room air Abdomen: +BS, soft, +distended and diffusely tender Uterine Fundus: exam limited by abdominal pain   Recent Labs  08/09/15 0710  HGB 10.6*  HCT 33.0*    Assessment/Plan: ASSESSMENT: Krystal Chan is a 30 y.o. [id number] 8861w0d s/p vacuum assisted delivery.  She has abdominal pain that is currently uncontrolled by current regimen.  She is afebrile.  ROM ~14 hours before delivery.  No white count on labs yesterday am.  Concern for severe constipation vs SBO vs endometritis (though lower suspicion given absence of fever and normal pulse).  Discussed with Joellyn HaffKim Booker, CNM.  Dr Despina HiddenEure to see.  Miralax added Give additional percocet 5/325 now   LOS: 1 day   Delynn FlavinAshly Gottschalk, DO 08/10/2015, 8:54 PM   Update  Dr Despina HiddenEure assessed patient.  Likely abdominal pain 2/2 excess gas/ constipation.  Recommending dc of Miralax and utilization of Dulcolax 10 suppository q6 prn instead.  Will plan to administer Soap Suds enema if no BM by morning.     Ashly M. Nadine CountsGottschalk, DO 08/10/15, 9:11pm PGY-2, [facility]

## 2015-08-10 NOTE — Progress Notes (Signed)
Post Partum Day #1 Subjective: up ad lib, voiding and tolerating PO; c/o abd tenderness- just rec'd Percocet recently; breastfeeding; POPs for contraception  Objective: Blood pressure 137/87, pulse 92, temperature 98.4 F (36.9 C), temperature source Oral, resp. rate 18, height 5\' 1"  (1.549 m), weight 96.616 kg (213 lb), last menstrual period 11/16/2014, SpO2 100 %, unknown if currently breastfeeding.  Physical Exam:  General: alert, cooperative and mild distress Lochia: appropriate Uterine Fundus: firm DVT Evaluation: No evidence of DVT seen on physical exam.   Recent Labs  08/09/15 0710  HGB 10.6*  HCT 33.0*    Assessment/Plan: Plan for discharge tomorrow   LOS: 1 day   Krystal Chan CNM 08/10/2015, 9:11 AM

## 2015-08-11 MED ORDER — BISACODYL 10 MG RE SUPP
10.0000 mg | Freq: Four times a day (QID) | RECTAL | Status: DC | PRN
  Administered 2015-08-11: 10 mg via RECTAL
  Filled 2015-08-11: qty 1

## 2015-08-11 MED ORDER — NORETHINDRONE 0.35 MG PO TABS: 1.0000 | ORAL_TABLET | Freq: Every day | ORAL | Status: DC

## 2015-08-11 NOTE — Discharge Summary (Signed)
OB Discharge Summary  Patient Name: Krystal Chan DOB: 06/21/1985 MRN: [id number]  Date of admission: 08/09/2015 Delivering MD: Catalina AntiguaONSTANT, PEGGY   Date of discharge: 08/11/2015  Admitting diagnosis: 38 WEEKS ROM Intrauterine pregnancy: 1070w0d     Secondary diagnosis:Active Problems:   Active labor  Additional problems:none     Discharge diagnosis: Term Pregnancy Delivered                                                                     Post partum procedures:none  Augmentation: Pitocin  Complications: None  Hospital course:  Onset of Labor With Vaginal Delivery     [age] y.o. yo [id number] at 7470w0d was admitted in Active Labor on 08/09/2015. Patient had an uncomplicated labor course as follows:  Membrane Rupture Time/Date: 4:15 AM ,08/09/2015   Intrapartum Procedures: Episiotomy: None [1]                                         Lacerations:     Patient had a delivery of a Viable infant. 08/09/2015  Information for the patient's newborn:  Orpah ClintonCook, Girl Autumnrose [[id number]][[id number]]  Delivery Method: Vaginal, Vacuum (Extractor) (Filed from Delivery Summary)    Pateint had an uncomplicated postpartum course.  She is ambulating, tolerating a regular diet, passing flatus, and urinating well. Patient is discharged home in stable condition on 08/11/2015.    Physical exam  Filed Vitals:   08/10/15 0515 08/10/15 0940 08/10/15 1750 08/11/15 0711  BP: 137/87 114/77 126/79 121/86  Pulse: 92 97 83 105  Temp:  98.8 F (37.1 C) 98.7 F (37.1 C) 98.7 F (37.1 C)  TempSrc:  Oral Oral Oral  Resp: 18 20 20 18   Height:      Weight:      SpO2:   99%    General: alert, cooperative and no distress Lochia: appropriate Uterine Fundus: firm Incision: N/A DVT Evaluation: No evidence of DVT seen on physical exam. Negative Homan's sign. No cords or calf tenderness. Labs: Lab Results  Component Value Date   WBC 9.7 08/09/2015   HGB 10.6* 08/09/2015   HCT 33.0* 08/09/2015   MCV 84.4 08/09/2015   PLT 164 08/09/2015   CMP Latest Ref Rng 04/22/2015  Glucose 65 - 99 mg/dL 87  BUN 6 - 20 mg/dL 6  Creatinine 1.300.44 - 8.651.00 mg/dL 7.840.59  Sodium 696135 - 295145 mmol/L 137  Potassium 3.5 - 5.1 mmol/L 3.8  Chloride 101 - 111 mmol/L 104  CO2 22 - 32 mmol/L 24  Calcium 8.9 - 10.3 mg/dL 9.0    Discharge instruction: per After Visit Summary and "Baby and Me Booklet".  After Visit Meds:    Medication List    ASK your doctor about these medications        CVS PRENATAL GUMMY PO  Take 2 tablets by mouth daily.     TYLENOL PO  Take 2 tablets by mouth every 6 (six) hours as needed (headaches and pain). Reported on 08/10/2015        Diet: routine diet  Activity: Advance as tolerated. Pelvic rest for 6 weeks.   Outpatient follow up:6 weeks  Follow up Appt:Future Appointments Date Time Provider Department Center  08/15/2015 9:45 AM Marlis Edelson, CNM [facility]  08/22/2015 9:45 AM Levie Heritage, DO [facility]   Follow up visit: No Follow-up on file.  Postpartum contraception: Progesterone only pills  Newborn Data: Live born female  Birth Weight: 8 lb 4.6 oz (3760 g) APGAR: 7, 8  Baby Feeding: Breast Disposition:home with mother   08/11/2015 Ferdie Ping, CNM

## 2015-08-11 NOTE — Anesthesia Preprocedure Evaluation (Signed)

## 2015-08-11 NOTE — Progress Notes (Signed)
Post Partum Day 2 Subjective: Eating, drinking, voiding, ambulating well.  Starting to pass gas, feeling much better, has not had bm.  Lochia and pain wnl.  Denies dizziness, lightheadedness, or sob. No complaints. Has only gotten 1 dose of dulcolax.   Objective: Blood pressure 121/86, pulse 105, temperature 98.7 F (37.1 C), temperature source Oral, resp. rate 18, height 5\' 1"  (1.549 m), weight 96.616 kg (213 lb), last menstrual period 11/16/2014, SpO2 99 %, unknown if currently breastfeeding.  Physical Exam:  General: alert, cooperative and no distress Lochia: appropriate Abd: still very distended, +BS Uterine Fundus: firm Incision: n/a DVT Evaluation: No evidence of DVT seen on physical exam. Negative Homan's sign. No cords or calf tenderness. No significant calf/ankle edema.   Recent Labs  08/09/15 0710  HGB 10.6*  HCT 33.0*    Assessment/Plan: Discussed w/ LHE, will continue dulcolax q 6hr until has passed a significant amount of gas/distention resolves. Do not have to wait on bm to d/c. Plan for d/c later this pm  Get up and walk halls this am Breast/bottlefeeding, POPs   LOS: 2 days   Marge DuncansBooker, Kimberly Randall 08/11/2015, 8:03 AM

## 2015-08-15 ENCOUNTER — Inpatient Hospital Stay (HOSPITAL_COMMUNITY)
Admission: AD | Admit: 2015-08-15 | Discharge: 2015-08-15 | Disposition: A | Discharge status: Home / Self Care | Payer: Medicaid Other | Source: Ambulatory Visit | Attending: Obstetrics and Gynecology | Admitting: Obstetrics and Gynecology

## 2015-08-15 ENCOUNTER — Encounter: Payer: Medicaid Other | Admitting: Family

## 2015-08-15 ENCOUNTER — Encounter (HOSPITAL_COMMUNITY): Payer: Self-pay | Admitting: *Deleted

## 2015-08-15 DIAGNOSIS — R109 Unspecified abdominal pain: Secondary | ICD-10-CM | POA: Diagnosis present

## 2015-08-15 DIAGNOSIS — Z79899 Other long term (current) drug therapy: Secondary | ICD-10-CM | POA: Diagnosis not present

## 2015-08-15 DIAGNOSIS — O9089 Other complications of the puerperium, not elsewhere classified: Secondary | ICD-10-CM | POA: Diagnosis not present

## 2015-08-15 DIAGNOSIS — R52 Pain, unspecified: Secondary | ICD-10-CM

## 2015-08-15 DIAGNOSIS — O9081 Anemia of the puerperium: Secondary | ICD-10-CM | POA: Diagnosis not present

## 2015-08-15 DIAGNOSIS — R42 Dizziness and giddiness: Secondary | ICD-10-CM | POA: Diagnosis present

## 2015-08-15 LAB — CBC
HCT: 27.2 % — ABNORMAL LOW (ref 36.0–46.0)
HEMOGLOBIN: 8.6 g/dL — AB (ref 12.0–15.0)
MCH: 26.5 pg (ref 26.0–34.0)
MCHC: 31.6 g/dL (ref 30.0–36.0)
MCV: 84 fL (ref 78.0–100.0)
PLATELETS: 293 10*3/uL (ref 150–400)
RBC: 3.24 MIL/uL — AB (ref 3.87–5.11)
RDW: 17 % — ABNORMAL HIGH (ref 11.5–15.5)
WBC: 13.1 10*3/uL — ABNORMAL HIGH (ref 4.0–10.5)

## 2015-08-15 MED ORDER — FERROUS GLUCONATE 324 (38 FE) MG PO TABS: 324.0000 mg | ORAL_TABLET | Freq: Two times a day (BID) | ORAL | Status: DC

## 2015-08-15 MED ORDER — KETOROLAC TROMETHAMINE 60 MG/2ML IM SOLN
60.0000 mg | Freq: Once | INTRAMUSCULAR | Status: AC
  Administered 2015-08-15: 60 mg via INTRAMUSCULAR
  Filled 2015-08-15: qty 2

## 2015-08-15 NOTE — MAU Note (Addendum)
[facility] (7).  States has had a loss of appetite and has been feeling weak and dizzy.  Denies LOC.  Pt also states has been having constant abdominal pain since delivery 8/10.

## 2015-08-15 NOTE — Discharge Instructions (Signed)

## 2015-08-15 NOTE — MAU Provider Note (Signed)
History     CSN: [id number]  Arrival date and time: 08/15/15 0102   First Provider Initiated Contact with Patient 08/15/15 0117      Chief Complaint  Patient presents with  . Dizziness  . Abdominal Pain   HPI Comments: Krystal Chan is a 30 y.o. [id number] who is S/P VAVD on 08/09/15. She states that since she left the hospital she has had abdominal pain. She is alternating tylenol and ibuprofen but it does not seem to help. She states that she went to [facility] today, and they checked her iron. She states that they told her it was around 7 there. Since leaving the [facility] office she has had intermittent dizziness. She reports that her bleeding it off and on at this time. She states that she has not passed any clots, but on occasion it will be heavy. Patient states that she was just concerned after her visit at [facility], and "wanted to make sure everything was ok".   Dizziness This is a new problem. The current episode started today. The problem occurs intermittently. The problem has been unchanged. Associated symptoms include abdominal pain. Pertinent negatives include no chills, fever, nausea or vomiting. Nothing aggravates the symptoms. She has tried nothing for the symptoms.  Abdominal Pain This is a new problem. The current episode started in the past 7 days. The onset quality is gradual. The problem occurs constantly. The problem has been unchanged. The pain is located in the generalized abdominal region. The pain is at a severity of 8/10. The quality of the pain is sharp. Pertinent negatives include no dysuria, fever, frequency, nausea or vomiting. The pain is aggravated by movement. She has tried acetaminophen (NSAIDS. Last took tylenol at 2100. Last took Ibuprofen 1630 ) for the symptoms. The treatment provided no relief.    History reviewed. No pertinent past medical history.  Past Surgical History  Procedure Laterality Date  . Hernia repair      gastic hernia 2005 and umbilical hernia 2004     Family History  Problem Relation Age of Onset  . Breast cancer Maternal Grandmother   . Hypertension Maternal Grandmother   . Cancer Maternal Grandmother   . Diabetes Maternal Grandfather   . Hypertension Mother   . Heart disease Mother   . Hypertension Paternal Grandmother   . Diabetes Paternal Grandmother   . Hearing loss Neg Hx     Social History  Substance Use Topics  . Smoking status: Never Smoker   . Smokeless tobacco: Never Used  . Alcohol Use: No     Comment: ocassionally    Allergies: No Known Allergies  Prescriptions prior to admission  Medication Sig Dispense Refill Last Dose  . norethindrone (CAMILA) 0.35 MG tablet Take 1 tablet (0.35 mg total) by mouth daily. 1 Package 11   . Prenatal Vit-Min-FA-Fish Oil (CVS PRENATAL GUMMY PO) Take 2 tablets by mouth daily.   08/09/2015 at 0800    Review of Systems  Constitutional: Negative for fever and chills.  Respiratory: Negative for shortness of breath.   Cardiovascular: Positive for leg swelling.  Gastrointestinal: Positive for abdominal pain. Negative for nausea and vomiting.  Genitourinary: Negative for dysuria, urgency and frequency.  Neurological: Positive for dizziness.   Physical Exam   Blood pressure 147/79, pulse 79, temperature 98.8 F (37.1 C), temperature source Oral, resp. rate 20, last menstrual period 11/16/2014, unknown if currently breastfeeding.  Physical Exam  Nursing note and vitals reviewed. Constitutional: She is oriented to person, place, and time.  She appears well-developed and well-nourished. No distress.  HENT:  Head: Normocephalic.  Cardiovascular: Normal rate.   Respiratory: Effort normal.  GI: Soft. There is no tenderness. There is no rebound.  Genitourinary:  Fundus: u-2, Firm Lochia: small amount   Neurological: She is alert and oriented to person, place, and time.  Skin: Skin is warm and dry.  Psychiatric: She has a normal mood and affect.   Results for orders placed or  performed during the hospital encounter of 08/15/15 (from the past 24 hour(s))  CBC     Status: Abnormal   Collection Time: 08/15/15  1:19 AM  Result Value Ref Range   WBC 13.1 (H) 4.0 - 10.5 K/uL   RBC 3.24 (L) 3.87 - 5.11 MIL/uL   Hemoglobin 8.6 (L) 12.0 - 15.0 g/dL   HCT 36.627.2 (L) 44.036.0 - 34.746.0 %   MCV 84.0 78.0 - 100.0 fL   MCH 26.5 26.0 - 34.0 pg   MCHC 31.6 30.0 - 36.0 g/dL   RDW 42.517.0 (H) 95.611.5 - 38.715.5 %   Platelets 293 150 - 400 K/uL    MAU Course  Procedures  MDM Patient given toradol and  heating pad. She reports that her pain has improved.  Reviewed labs with the patient. Reassured HGB anemic, but ok for outpatient PO supplementation.   Assessment and Plan   1. Anemia, postpartum   2. Postpartum pain    DC home Comfort measures reviewed  RX: ferrous gluconate BID #60  Return to MAU as needed FU with OB as planned  Follow-up Information    [facility]   Specialty:  Obstetrics and Gynecology   Why:  As scheduled   Contact information:   [address] [place] [id number] [phone]      Tawnya CrookHogan, Heather Donovan 08/15/2015, 1:24 AM

## 2015-08-19 ENCOUNTER — Telehealth: Payer: Self-pay | Admitting: *Deleted

## 2015-08-19 NOTE — Telephone Encounter (Signed)
W. R. BerkleyJeannie Church nurse with [employer] called to let us know that she had seen patient and had some concerns. Patient is having abdominal pain, that she was seen at MAU for. Patient also feeling depressed. Pt informed nurse that she would like to [place] our SW here at the clinic. Patient is not suicidal or having any thoughts of harming herself or anyone else. Attempted to call patient but her phone was disconnected. Called Jeanie back and left message that I'm calling her back to get a good contact number for patient. Per Tresa EndoKelly patient may come in on Thursday to see Arline AspCindy or she can call her directly prior to Thursday at [phone].

## 2015-08-20 NOTE — Telephone Encounter (Addendum)
Received message today @ [phone] from [facility] Church RN. She stated that she also has not been able to reach pt by phone and the only contact # she has for pt is [phone]. She is going to see pt today @ her home to provide a food resource list. Krystal LowJeannie can be reached @ [phone]. I called Krystal and left a message stating that pt may be seen by Child psychotherapistocial Worker @ our office on 4/13 if desired. I asked Krystal to call back regarding this situation. (Pt needs to be informed that she may also call Krystal Chan - Social Worker directly @ [phone].)  1600 Received return call from [facility] Chan who stated that she did see Krystal Gelinasikita today and has 2 additional phone numbers for pt which she left on our nurse voice mail earlier this afternoon. Pt will need to be contacted and informed that she may see Krystal Chan in person here at the clinic on 4/13 or reach her by phone.

## 2015-08-21 NOTE — Progress Notes (Signed)
Post discharge chart review completed.  

## 2015-08-22 ENCOUNTER — Encounter: Payer: Medicaid Other | Admitting: Family Medicine

## 2015-08-22 ENCOUNTER — Ambulatory Visit: Payer: Medicaid Other

## 2015-09-27 ENCOUNTER — Ambulatory Visit: Payer: Medicaid Other | Admitting: Advanced Practice Midwife

## 2015-11-04 ENCOUNTER — Ambulatory Visit: Payer: Medicaid Other | Admitting: Family Medicine

## 2015-12-12 ENCOUNTER — Ambulatory Visit: Payer: Medicaid Other | Admitting: Medical

## 2016-06-09 ENCOUNTER — Emergency Department
Admission: EM | Admit: 2016-06-09 | Discharge: 2016-06-09 | Discharge status: Absent without leave | Payer: Self-pay | Source: Home / Self Care

## 2016-06-23 ENCOUNTER — Ambulatory Visit: Payer: Medicaid Other | Admitting: Obstetrics and Gynecology

## 2016-07-30 ENCOUNTER — Encounter: Payer: Self-pay | Admitting: Obstetrics and Gynecology

## 2016-07-30 ENCOUNTER — Ambulatory Visit: Payer: Medicaid Other | Admitting: Obstetrics and Gynecology

## 2016-07-30 NOTE — Progress Notes (Signed)
Patient did not keep GYN appointment for 07/30/2016.  Krystal Chan, Jr MD Attending Center for [facility](Faculty Practice)

## 2016-12-17 ENCOUNTER — Ambulatory Visit (INDEPENDENT_AMBULATORY_CARE_PROVIDER_SITE_OTHER): Payer: Self-pay

## 2016-12-17 ENCOUNTER — Encounter: Payer: Self-pay | Admitting: Obstetrics and Gynecology

## 2016-12-17 DIAGNOSIS — Z3201 Encounter for pregnancy test, result positive: Secondary | ICD-10-CM

## 2016-12-17 LAB — POCT PREGNANCY, URINE: PREG TEST UR: POSITIVE — AB

## 2016-12-17 NOTE — Progress Notes (Signed)
Patient presented to the office today for a pregnancy test. Test confirms she is pregnant around 5 weeks.Patient reports taking no medication at this time. A letter of pregnancy has been provided to the patient.

## 2016-12-19 ENCOUNTER — Inpatient Hospital Stay (HOSPITAL_COMMUNITY)
Admit: 2016-12-19 | Discharge: 2016-12-19 | Disposition: A | Discharge status: Home / Self Care | Payer: Medicaid Other | Source: Ambulatory Visit | Attending: Family Medicine | Admitting: Family Medicine

## 2016-12-19 ENCOUNTER — Inpatient Hospital Stay (HOSPITAL_COMMUNITY): Payer: Medicaid Other

## 2016-12-19 ENCOUNTER — Encounter (HOSPITAL_COMMUNITY): Payer: Self-pay | Admitting: *Deleted

## 2016-12-19 DIAGNOSIS — R002 Palpitations: Secondary | ICD-10-CM

## 2016-12-19 DIAGNOSIS — O30041 Twin pregnancy, dichorionic/diamniotic, first trimester: Secondary | ICD-10-CM | POA: Diagnosis not present

## 2016-12-19 DIAGNOSIS — Z8249 Family history of ischemic heart disease and other diseases of the circulatory system: Secondary | ICD-10-CM | POA: Diagnosis not present

## 2016-12-19 DIAGNOSIS — Z833 Family history of diabetes mellitus: Secondary | ICD-10-CM | POA: Diagnosis not present

## 2016-12-19 DIAGNOSIS — O26891 Other specified pregnancy related conditions, first trimester: Secondary | ICD-10-CM | POA: Insufficient documentation

## 2016-12-19 DIAGNOSIS — Z803 Family history of malignant neoplasm of breast: Secondary | ICD-10-CM | POA: Insufficient documentation

## 2016-12-19 DIAGNOSIS — O30042 Twin pregnancy, dichorionic/diamniotic, second trimester: Secondary | ICD-10-CM | POA: Diagnosis not present

## 2016-12-19 DIAGNOSIS — Z6790 Unspecified blood type, Rh positive: Secondary | ICD-10-CM

## 2016-12-19 DIAGNOSIS — Z9889 Other specified postprocedural states: Secondary | ICD-10-CM | POA: Insufficient documentation

## 2016-12-19 DIAGNOSIS — Z3A01 Less than 8 weeks gestation of pregnancy: Secondary | ICD-10-CM

## 2016-12-19 DIAGNOSIS — R42 Dizziness and giddiness: Secondary | ICD-10-CM | POA: Insufficient documentation

## 2016-12-19 DIAGNOSIS — O4690 Antepartum hemorrhage, unspecified, unspecified trimester: Secondary | ICD-10-CM

## 2016-12-19 DIAGNOSIS — O209 Hemorrhage in early pregnancy, unspecified: Secondary | ICD-10-CM | POA: Insufficient documentation

## 2016-12-19 DIAGNOSIS — O4691 Antepartum hemorrhage, unspecified, first trimester: Secondary | ICD-10-CM | POA: Diagnosis present

## 2016-12-19 LAB — URINALYSIS, ROUTINE W REFLEX MICROSCOPIC
BACTERIA UA: NONE SEEN
BILIRUBIN URINE: NEGATIVE
Glucose, UA: NEGATIVE mg/dL
Hgb urine dipstick: NEGATIVE
KETONES UR: NEGATIVE mg/dL
Nitrite: NEGATIVE
PROTEIN: NEGATIVE mg/dL
SPECIFIC GRAVITY, URINE: 1.013 (ref 1.005–1.030)
pH: 6 (ref 5.0–8.0)

## 2016-12-19 LAB — HCG, QUANTITATIVE, PREGNANCY: hCG, Beta Chain, Quant, S: 70570 m[IU]/mL — ABNORMAL HIGH (ref <5)

## 2016-12-19 LAB — WET PREP, GENITAL
Sperm: NONE SEEN
TRICH WET PREP: NONE SEEN
Yeast Wet Prep HPF POC: NONE SEEN

## 2016-12-19 LAB — CBC
HCT: 37.9 % (ref 36.0–46.0)
Hemoglobin: 12.7 g/dL (ref 12.0–15.0)
MCH: 30.6 pg (ref 26.0–34.0)
MCHC: 33.5 g/dL (ref 30.0–36.0)
MCV: 91.3 fL (ref 78.0–100.0)
PLATELETS: 224 10*3/uL (ref 150–400)
RBC: 4.15 MIL/uL (ref 3.87–5.11)
RDW: 13.8 % (ref 11.5–15.5)
WBC: 11.1 10*3/uL — ABNORMAL HIGH (ref 4.0–10.5)

## 2016-12-19 NOTE — MAU Provider Note (Signed)
History     CSN: [id number]  Arrival date and time: 12/19/16 1454   First Provider Initiated Contact with Patient 12/19/16 1609      Chief Complaint  Patient presents with  . Vaginal Bleeding  . Dizziness   [id number] @[redacted]w[redacted]d  by LMP here with heart palpitations and VB. Palpitations started about 1 week ago and are intermittent. No SOB or CP. VB started 1 week ago. Some days it is lite and others heavier requiring a liner. No abdominal pain or cramping.     OB History    Gravida Para Term Preterm AB Living   6 3 2 1 2 3    SAB TAB Ectopic Multiple Live Births     2   0 1      History reviewed. No pertinent past medical history.  Past Surgical History:  Procedure Laterality Date  . HERNIA REPAIR     gastic hernia 2005 and umbilical hernia 2004    Family History  Problem Relation Age of Onset  . Breast cancer Maternal Grandmother   . Hypertension Maternal Grandmother   . Cancer Maternal Grandmother   . Diabetes Maternal Grandfather   . Hypertension Mother   . Heart disease Mother   . Hypertension Paternal Grandmother   . Diabetes Paternal Grandmother   . Hearing loss Neg Hx     Social History  Substance Use Topics  . Smoking status: Never Smoker  . Smokeless tobacco: Never Used  . Alcohol use No     Comment: ocassionally    Allergies: No Known Allergies  Prescriptions Prior to Admission  Medication Sig Dispense Refill Last Dose  . Prenatal MV-Min-FA-Omega-3 (PRENATAL GUMMIES/DHA & FA) 0.4-32.5 MG CHEW Chew 2 each by mouth daily.   12/19/2016 at Unknown time    Review of Systems  Respiratory: Negative for shortness of breath.   Cardiovascular: Negative for chest pain.  Gastrointestinal: Negative for abdominal pain.  Genitourinary: Positive for vaginal bleeding. Negative for vaginal discharge.  Neurological: Positive for dizziness. Negative for syncope.   Physical Exam   Blood pressure 138/84, pulse 76, temperature 99 F (37.2 C), temperature source Oral,  resp. rate 18, height 5\' 1"  (1.549 m), weight 212 lb (96.2 kg), last menstrual period 11/09/2016, SpO2 100 %, unknown if currently breastfeeding.  Physical Exam  Nursing note and vitals reviewed. Constitutional: She is oriented to person, place, and time. She appears well-developed and well-nourished. No distress.  HENT:  Head: Normocephalic and atraumatic.  Neck: Normal range of motion.  Cardiovascular: Normal rate, regular rhythm and normal heart sounds.   Respiratory: Effort normal and breath sounds normal. No respiratory distress.  GI: Soft. She exhibits no distension. There is no tenderness.  Genitourinary:  Genitourinary Comments: External: no  lesions or erythema Vagina: rugated, pink, moist, scant thin white discharge, no blood Uterus: +enlarged, anteverted, non tender, no CMT Adnexae: no masses, no tenderness left, no tenderness right   Musculoskeletal: Normal range of motion.  Neurological: She is alert and oriented to person, place, and time.  Skin: Skin is warm and dry.  Psychiatric: She has a normal mood and affect.   Results for orders placed or performed during the hospital encounter of 12/19/16 (from the past 24 hour(s))  CBC     Status: Abnormal   Collection Time: 12/19/16  4:27 PM  Result Value Ref Range   WBC 11.1 (H) 4.0 - 10.5 K/uL   RBC 4.15 3.87 - 5.11 MIL/uL   Hemoglobin 12.7 12.0 - 15.0 g/dL  HCT 37.9 36.0 - 46.0 %   MCV 91.3 78.0 - 100.0 fL   MCH 30.6 26.0 - 34.0 pg   MCHC 33.5 30.0 - 36.0 g/dL   RDW 16.1 09.6 - 04.5 %   Platelets 224 150 - 400 K/uL  hCG, quantitative, pregnancy     Status: Abnormal   Collection Time: 12/19/16  4:27 PM  Result Value Ref Range   hCG, Beta Chain, Quant, S 70,570 (H) <5 mIU/mL  Wet prep, genital     Status: Abnormal   Collection Time: 12/19/16  4:35 PM  Result Value Ref Range   Yeast Wet Prep HPF POC NONE SEEN NONE SEEN   Trich, Wet Prep NONE SEEN NONE SEEN   Clue Cells Wet Prep HPF POC PRESENT (A) NONE SEEN   WBC,  Wet Prep HPF POC MANY (A) NONE SEEN   Sperm NONE SEEN   Urinalysis, Routine w reflex microscopic     Status: Abnormal   Collection Time: 12/19/16  4:50 PM  Result Value Ref Range   Color, Urine YELLOW YELLOW   APPearance CLEAR CLEAR   Specific Gravity, Urine 1.013 1.005 - 1.030   pH 6.0 5.0 - 8.0   Glucose, UA NEGATIVE NEGATIVE mg/dL   Hgb urine dipstick NEGATIVE NEGATIVE   Bilirubin Urine NEGATIVE NEGATIVE   Ketones, ur NEGATIVE NEGATIVE mg/dL   Protein, ur NEGATIVE NEGATIVE mg/dL   Nitrite NEGATIVE NEGATIVE   Leukocytes, UA TRACE (A) NEGATIVE   RBC / HPF 0-5 0 - 5 RBC/hpf   WBC, UA 0-5 0 - 5 WBC/hpf   Bacteria, UA NONE SEEN NONE SEEN   Squamous Epithelial / LPF 0-5 (A) NONE SEEN   Mucous PRESENT    US Ob Comp Less 14 Wks  Result Date: 12/19/2016 CLINICAL DATA:  Spotting x2 weeks EXAM: TWIN OBSTETRIC <14WK [name] AND TRANSVAGINAL OB US COMPARISON:  None. FINDINGS: Number of IUPs:  2 Chorionicity/Amnionicity:  Dichorionic-diamniotic (thick membrane) TWIN 1 Yolk sac:  Visualized. Embryo:  Visualize. Cardiac Activity: Visualized. Heart Rate: 107 bpm CRL:  3.8  mm   6 w 0 d                  [name] EDC: 08/14/2017 TWIN 2 Yolk sac:  Visualized. Embryo:  Visualized. Cardiac Activity: Visualized. Heart Rate: 107 bpm CRL:  3.2 mm  mm   5 w 6 d                  [name] EDC: 08/15/2017 Subchorionic hemorrhage:  None visualized. Maternal uterus/adnexae: Normal bilateral ovaries. IMPRESSION: 1. Diamniotic, dichorionic twin intrauterine viable gestational pregnancy estimated at 6 weeks 0 days with ultrasound EDC of 08/14/2017 based on more advanced crown-rump length of twin 1. 2. No perigestational hematoma identified. Electronically Signed   By: Tollie Eth M.D.   On: 12/19/2016 17:44   US Ob Comp Addl Gest Less 14 Wks  Result Date: 12/19/2016 CLINICAL DATA:  Spotting x2 weeks EXAM: TWIN OBSTETRIC <14WK [name] AND TRANSVAGINAL OB US COMPARISON:  None. FINDINGS: Number of IUPs:  2 Chorionicity/Amnionicity:   Dichorionic-diamniotic (thick membrane) TWIN 1 Yolk sac:  Visualized. Embryo:  Visualize. Cardiac Activity: Visualized. Heart Rate: 107 bpm CRL:  3.8  mm   6 w 0 d                  [name] EDC: 08/14/2017 TWIN 2 Yolk sac:  Visualized. Embryo:  Visualized. Cardiac Activity: Visualized. Heart Rate: 107 bpm CRL:  3.2 mm  mm  5 w 6 d                  US EDC: 08/15/2017 Subchorionic hemorrhage:  None visualized. Maternal uterus/adnexae: Normal bilateral ovaries. IMPRESSION: 1. Diamniotic, dichorionic twin intrauterine viable gestational pregnancy estimated at 6 weeks 0 days with ultrasound EDC of 08/14/2017 based on more advanced crown-rump length of twin 1. 2. No perigestational hematoma identified. Electronically Signed   By: Tollie Ethavid  Kwon M.D.   On: 12/19/2016 17:44   [place] Ob Transvaginal  Result Date: 12/19/2016 CLINICAL DATA:  Spotting x2 weeks EXAM: TWIN OBSTETRIC <14WK US AND TRANSVAGINAL OB US COMPARISON:  None. FINDINGS: Number of IUPs:  2 Chorionicity/Amnionicity:  Dichorionic-diamniotic (thick membrane) TWIN 1 Yolk sac:  Visualized. Embryo:  Visualize. Cardiac Activity: Visualized. Heart Rate: 107 bpm CRL:  3.8  mm   6 w 0 d                  US EDC: 08/14/2017 TWIN 2 Yolk sac:  Visualized. Embryo:  Visualized. Cardiac Activity: Visualized. Heart Rate: 107 bpm CRL:  3.2 mm  mm   5 w 6 d                  US EDC: 08/15/2017 Subchorionic hemorrhage:  None visualized. Maternal uterus/adnexae: Normal bilateral ovaries. IMPRESSION: 1. Diamniotic, dichorionic twin intrauterine viable gestational pregnancy estimated at 6 weeks 0 days with ultrasound EDC of 08/14/2017 based on more advanced crown-rump length of twin 1. 2. No perigestational hematoma identified. Electronically Signed   By: Tollie Ethavid  Kwon M.D.   On: 12/19/2016 17:44   MAU Course  Procedures  MDM Labs and US ordered and reviewed. Normal living didi twins on US, dates consistent. Informed pt of twin pregnancy. No found source of bleeding. EKG normal with  occasional PVCs, will plan cardiology consult if sx persist. BP slightly elevated- may be CHTN, will recheck at first OB visit. [facility] Stable for discharge home.   Assessment and Plan   1. [redacted] weeks gestation of pregnancy   2. Vaginal bleeding in pregnancy   3. Dichorionic diamniotic twin pregnancy in first trimester   4. Blood type, Rh positive   5. Heart palpitations    Discharge home Follow up in [facility] in 5 weeks to start care SAB/bleeding precautions Start PNV OTC 1 po daily  Allergies as of 12/19/2016   No Known Allergies     Medication List    TAKE these medications   PRENATAL GUMMIES/DHA & FA 0.4-32.5 MG Chew Chew 2 each by mouth daily.      Donette LarryMelanie Sofya Moustafa, CNM 12/19/2016, 6:12 PM

## 2016-12-19 NOTE — MAU Note (Signed)
Pt states she has been having problems with dizziness for the last 2 weeks, also reports she feels like her "heart is fluttering" at times. Reports spotting off/on for a few weeks.

## 2016-12-19 NOTE — Discharge Instructions (Signed)
Vaginal Bleeding During Pregnancy, First Trimester °A small amount of bleeding (spotting) from the vagina is common in early pregnancy. Sometimes the bleeding is normal and is not a problem, and sometimes it is a sign of something serious. Be sure to tell your doctor about any bleeding from your vagina right away. °Follow these instructions at home: °· Watch your condition for any changes. °· Follow your doctor's instructions about how active you can be. °· If you are on bed rest: °? You may need to stay in bed and only get up to use the bathroom. °? You may be allowed to do some activities. °? If you need help, make plans for someone to help you. °· Write down: °? The number of pads you use each day. °? How often you change pads. °? How soaked (saturated) your pads are. °· Do not use tampons. °· Do not douche. °· Do not have sex or orgasms until your doctor says it is okay. °· If you pass any tissue from your vagina, save the tissue so you can show it to your doctor. °· Only take medicines as told by your doctor. °· Do not take aspirin because it can make you bleed. °· Keep all follow-up visits as told by your doctor. °Contact a doctor if: °· You bleed from your vagina. °· You have cramps. °· You have labor pains. °· You have a fever that does not go away after you take medicine. °Get help right away if: °· You have very bad cramps in your back or belly (abdomen). °· You pass large clots or tissue from your vagina. °· You bleed more. °· You feel light-headed or weak. °· You pass out (faint). °· You have chills. °· You are leaking fluid or have a gush of fluid from your vagina. °· You pass out while pooping (having a bowel movement). °This information is not intended to replace advice given to you by your health care provider. Make sure you discuss any questions you have with your health care provider. °Document Released: 09/11/2013 Document Revised: 10/03/2015 Document Reviewed: 01/02/2013 °Elsevier Interactive  Patient Education © 2018 [employer] ° °

## 2016-12-21 LAB — GC/CHLAMYDIA PROBE AMP (~~LOC~~) NOT AT ARMC
CHLAMYDIA, DNA PROBE: NEGATIVE
Neisseria Gonorrhea: NEGATIVE

## 2017-01-25 ENCOUNTER — Encounter (HOSPITAL_COMMUNITY): Payer: Self-pay

## 2017-01-25 ENCOUNTER — Inpatient Hospital Stay (HOSPITAL_COMMUNITY)
Admission: AD | Admit: 2017-01-25 | Discharge: 2017-01-25 | Disposition: A | Discharge status: Home / Self Care | Payer: Medicaid Other | Source: Ambulatory Visit | Attending: Family Medicine | Admitting: Family Medicine

## 2017-01-25 DIAGNOSIS — Z803 Family history of malignant neoplasm of breast: Secondary | ICD-10-CM | POA: Insufficient documentation

## 2017-01-25 DIAGNOSIS — Z9889 Other specified postprocedural states: Secondary | ICD-10-CM | POA: Insufficient documentation

## 2017-01-25 DIAGNOSIS — Z833 Family history of diabetes mellitus: Secondary | ICD-10-CM | POA: Insufficient documentation

## 2017-01-25 DIAGNOSIS — K117 Disturbances of salivary secretion: Secondary | ICD-10-CM

## 2017-01-25 DIAGNOSIS — Z79899 Other long term (current) drug therapy: Secondary | ICD-10-CM | POA: Insufficient documentation

## 2017-01-25 DIAGNOSIS — Z3A11 11 weeks gestation of pregnancy: Secondary | ICD-10-CM

## 2017-01-25 DIAGNOSIS — O219 Vomiting of pregnancy, unspecified: Secondary | ICD-10-CM

## 2017-01-25 DIAGNOSIS — Z8249 Family history of ischemic heart disease and other diseases of the circulatory system: Secondary | ICD-10-CM | POA: Insufficient documentation

## 2017-01-25 LAB — URINALYSIS, ROUTINE W REFLEX MICROSCOPIC
Bilirubin Urine: NEGATIVE
GLUCOSE, UA: NEGATIVE mg/dL
HGB URINE DIPSTICK: NEGATIVE
Ketones, ur: NEGATIVE mg/dL
NITRITE: NEGATIVE
Protein, ur: 30 mg/dL — AB
SPECIFIC GRAVITY, URINE: 1.024 (ref 1.005–1.030)
pH: 6 (ref 5.0–8.0)

## 2017-01-25 MED ORDER — PROMETHAZINE HCL 25 MG PO TABS: 12.5000 mg | ORAL_TABLET | Freq: Four times a day (QID) | ORAL | 0 refills | Status: DC | PRN

## 2017-01-25 MED ORDER — GLYCOPYRROLATE 1 MG PO TABS
1.0000 mg | ORAL_TABLET | Freq: Once | ORAL | Status: AC
  Administered 2017-01-25: 1 mg via ORAL
  Filled 2017-01-25: qty 1

## 2017-01-25 MED ORDER — DEXTROSE IN LACTATED RINGERS 5 % IV SOLN
25.0000 mg | Freq: Once | INTRAVENOUS | Status: AC
  Administered 2017-01-25: 25 mg via INTRAVENOUS
  Filled 2017-01-25: qty 1

## 2017-01-25 MED ORDER — M.V.I. ADULT IV INJ
Freq: Once | INTRAVENOUS | Status: AC
  Administered 2017-01-25: 20:00:00 via INTRAVENOUS
  Filled 2017-01-25: qty 1000

## 2017-01-25 MED ORDER — GLYCOPYRROLATE 1 MG PO TABS: 1.0000 mg | ORAL_TABLET | Freq: Three times a day (TID) | ORAL | 3 refills | Status: DC

## 2017-01-25 NOTE — MAU Note (Signed)
Pt requested crackers.

## 2017-01-25 NOTE — MAU Note (Signed)
Pt tolerated crackers and asking for more. Started multivitamin bag.

## 2017-01-25 NOTE — MAU Provider Note (Signed)
History     CSN: [id number]  Arrival date and time: 01/25/17 1527   First Provider Initiated Contact with Patient 01/25/17 1729      Chief Complaint  Patient presents with  . Emesis   HPI  [name] is a 31 y.o. [id number] at [redacted]w[redacted]d gestation presenting to MAU with complaints of N/V and unable to keep any food or fluids down and seeing blood in her emesis.  She also c/o occ pain on RT side when lying on that side at night.  She has no other complaints.  She was seen here recently for spotting, but denies spotting or VB at this time.  History reviewed. No pertinent past medical history.  Past Surgical History:  Procedure Laterality Date  . HERNIA REPAIR     gastic hernia 2005 and umbilical hernia 2004    Family History  Problem Relation Age of Onset  . Breast cancer Maternal Grandmother   . Hypertension Maternal Grandmother   . Cancer Maternal Grandmother   . Diabetes Maternal Grandfather   . Hypertension Mother   . Heart disease Mother   . Hypertension Paternal Grandmother   . Diabetes Paternal Grandmother   . Hearing loss Neg Hx     Social History  Substance Use Topics  . Smoking status: Never Smoker  . Smokeless tobacco: Never Used  . Alcohol use No     Comment: ocassionally    Allergies: No Known Allergies  Prescriptions Prior to Admission  Medication Sig Dispense Refill Last Dose  . Prenatal Vit-Fe Fumarate-FA (PRENATAL MULTIVITAMIN) TABS tablet Take 1 tablet by mouth daily at 12 noon.   01/24/2017 at Unknown time    Review of Systems  Constitutional: Negative.   HENT: Negative.   Eyes: Negative.   Respiratory: Negative.   Cardiovascular: Negative.   Gastrointestinal: Positive for abdominal pain, nausea and vomiting.  Endocrine: Negative.   Genitourinary: Negative.   Musculoskeletal: Negative.   Skin: Negative.   Allergic/Immunologic: Negative.   Neurological: Negative.   Hematological: Negative.   Psychiatric/Behavioral: Negative.     Physical Exam   Blood pressure 133/79, pulse 81, temperature 98.8 F (37.1 C), temperature source Oral, resp. rate 16, height  (1.549 m), weight 94.3 kg (208 lb), last menstrual period 11/09/2016, SpO2 100 %, unknown if currently breastfeeding.  Physical Exam  Nursing note and vitals reviewed. Constitutional: She is oriented to person, place, and time. She appears well-developed and well-nourished.  HENT:  Head: Normocephalic.  Eyes: Pupils are equal, round, and reactive to light.  Neck: Normal range of motion.  Cardiovascular: Normal rate, regular rhythm and normal heart sounds.   Respiratory: Effort normal and breath sounds normal.  GI: Soft. Bowel sounds are decreased.  Genitourinary:  Genitourinary Comments: deferred  Musculoskeletal: Normal range of motion.  Neurological: She is alert and oriented to person, place, and time.  Skin: Skin is warm and dry.  Psychiatric: She has a normal mood and affect. Her behavior is normal. Judgment and thought content normal.    MAU Course  Procedures  MDM CCUA IVFs: Phenergan 25 mg in 1000 ml D5LR @ 999 ml/hr; then MVI 1 amp in 1000 ml LR @ 500 ml/hr  Results for orders placed or performed during the hospital encounter of 01/25/17 (from the past 24 hour(s))  Urinalysis, Routine w reflex microscopic     Status: Abnormal   Collection Time: 01/25/17  3:45 PM  Result Value Ref Range   Color, Urine YELLOW YELLOW   APPearance HAZY (  A) CLEAR   Specific Gravity, Urine 1.024 1.005 - 1.030   pH 6.0 5.0 - 8.0   Glucose, UA NEGATIVE NEGATIVE mg/dL   Hgb urine dipstick NEGATIVE NEGATIVE   Bilirubin Urine NEGATIVE NEGATIVE   Ketones, ur NEGATIVE NEGATIVE mg/dL   Protein, ur 30 (A) NEGATIVE mg/dL   Nitrite NEGATIVE NEGATIVE   Leukocytes, UA TRACE (A) NEGATIVE   RBC / HPF 0-5 0 - 5 RBC/hpf   WBC, UA 0-5 0 - 5 WBC/hpf   Bacteria, UA RARE (A) NONE SEEN   Squamous Epithelial / LPF 0-5 (A) NONE SEEN   Mucus PRESENT    Report and care  assumed by [name], CNM @ [address], MSN, CNM 01/25/2017, 5:33 PM  Patient had IV fluids and meds. She is feeling better. Tolerating PO   Assessment and Plan   1. Nausea/vomiting in pregnancy   2. Ptyalism   3. [redacted] weeks gestation of pregnancy    DC home Comfort measures reviewed  1st/2nd Trimester precautions  Bleeding precautions RX: Phenergan PRN, Robinul as directed.  Return to MAU as needed FU with OB as planned  Follow-up Information    Department, [facility] Follow up.   Contact information: [address] [place] [address] [phone]

## 2017-01-25 NOTE — MAU Note (Signed)
Pt states she can't keep anything down for the last week, also reports she is seeing blood in her emesis. States sometimes at night if she is laying on her left side she has pain on that side. Denies other complaints

## 2017-01-25 NOTE — Discharge Instructions (Signed)
Prenatal Care Providers °[facility]    [facility] ° Phone- [phone]     Phone: [phone] °         °[facility]                      [facility] ° @[place]     Phone: [phone] ° Phone: [phone] °        [facility] °[facility]     Phone: [phone] ° Phone: [phone]   °        [facility] °[facility] [place]                hone: [phone] ° Phone: [phone] °        [facility] °Dr. Bernard Marshall      Phone: [phone] ° Phone: [phone] °        [facility] °[facility]                Phone: [phone] ° [facility]’s [facility]  ° Phone:[phone]    [facility]) °         Phone: [phone] °[facility] °  Phone: [phone] °

## 2017-01-28 ENCOUNTER — Encounter: Payer: Self-pay | Admitting: Advanced Practice Midwife

## 2017-01-28 ENCOUNTER — Ambulatory Visit: Payer: Self-pay

## 2017-01-28 ENCOUNTER — Other Ambulatory Visit (HOSPITAL_COMMUNITY)
Admission: RE | Admit: 2017-01-28 | Discharge: 2017-01-28 | Disposition: A | Discharge status: Home / Self Care | Payer: Medicaid Other | Source: Ambulatory Visit | Attending: Advanced Practice Midwife | Admitting: Advanced Practice Midwife

## 2017-01-28 ENCOUNTER — Ambulatory Visit (INDEPENDENT_AMBULATORY_CARE_PROVIDER_SITE_OTHER): Payer: Medicaid Other | Admitting: Advanced Practice Midwife

## 2017-01-28 VITALS — BP 130/88 | HR 74 | Wt 208.6 lb

## 2017-01-28 DIAGNOSIS — O3680X0 Pregnancy with inconclusive fetal viability, not applicable or unspecified: Secondary | ICD-10-CM | POA: Diagnosis present

## 2017-01-28 DIAGNOSIS — O0991 Supervision of high risk pregnancy, unspecified, first trimester: Secondary | ICD-10-CM

## 2017-01-28 DIAGNOSIS — Z3A11 11 weeks gestation of pregnancy: Secondary | ICD-10-CM | POA: Insufficient documentation

## 2017-01-28 DIAGNOSIS — O30049 Twin pregnancy, dichorionic/diamniotic, unspecified trimester: Secondary | ICD-10-CM | POA: Insufficient documentation

## 2017-01-28 DIAGNOSIS — O09211 Supervision of pregnancy with history of pre-term labor, first trimester: Secondary | ICD-10-CM | POA: Diagnosis not present

## 2017-01-28 DIAGNOSIS — O0990 Supervision of high risk pregnancy, unspecified, unspecified trimester: Secondary | ICD-10-CM

## 2017-01-28 DIAGNOSIS — Z3A12 12 weeks gestation of pregnancy: Secondary | ICD-10-CM

## 2017-01-28 DIAGNOSIS — O09219 Supervision of pregnancy with history of pre-term labor, unspecified trimester: Secondary | ICD-10-CM

## 2017-01-28 DIAGNOSIS — Z124 Encounter for screening for malignant neoplasm of cervix: Secondary | ICD-10-CM | POA: Diagnosis not present

## 2017-01-28 DIAGNOSIS — O09899 Supervision of other high risk pregnancies, unspecified trimester: Secondary | ICD-10-CM

## 2017-01-28 DIAGNOSIS — O30041 Twin pregnancy, dichorionic/diamniotic, first trimester: Secondary | ICD-10-CM | POA: Diagnosis present

## 2017-01-28 DIAGNOSIS — K117 Disturbances of salivary secretion: Secondary | ICD-10-CM

## 2017-01-28 LAB — POCT URINALYSIS DIP (DEVICE)
BILIRUBIN URINE: NEGATIVE
Glucose, UA: NEGATIVE mg/dL
Hgb urine dipstick: NEGATIVE
Leukocytes, UA: NEGATIVE
NITRITE: NEGATIVE
PH: 7 (ref 5.0–8.0)
PROTEIN: 30 mg/dL — AB
Specific Gravity, Urine: 1.025 (ref 1.005–1.030)
UROBILINOGEN UA: 1 mg/dL (ref 0.0–1.0)

## 2017-01-28 MED ORDER — CONCEPT OB 130-92.4-1 MG PO CAPS: 1.0000 | ORAL_CAPSULE | Freq: Every day | ORAL | 12 refills | Status: DC

## 2017-01-28 MED ORDER — GLYCOPYRROLATE 1 MG PO TABS: 1.0000 mg | ORAL_TABLET | Freq: Three times a day (TID) | ORAL | 3 refills | Status: DC

## 2017-01-28 NOTE — Progress Notes (Signed)
Pt informed that the ultrasound is considered a limited OB ultrasound and is not intended to be a complete ultrasound exam.  Patient also informed that the ultrasound is not being completed with the intent of assessing for fetal or placental anomalies or any pelvic abnormalities.  Explained that the purpose of today's ultrasound is to assess for viability.  Patient acknowledges the purpose of the exam and the limitations of the study.    Twin A  FHR - 166 bpm,  FM present Twin B  FHR - 162 bpm,  FM present

## 2017-01-28 NOTE — Patient Instructions (Signed)
Floradix Iron supplement  ([employer] or [employer])

## 2017-01-28 NOTE — Progress Notes (Signed)
  Subjective:    Krystal Chan is being seen today for her first obstetrical visit.  This is not a planned pregnancy. She is at [redacted]w[redacted]d gestation by LMP and early [name]. Her obstetrical history is significant for Hx spontaneous PTD. Patient does intend to breast feed. Pregnancy history fully reviewed.  Patient reports nausea, vomiting and ptyalism. Was seen in MAU for these problems. Rx Phenergen and Robinul. The latter was not covered by Medicaid. Able to keep down food and fluids. Gaining weight well.   Review of Systems:   Review of Systems  Constitutional: Negative for appetite change, chills and fever.  Gastrointestinal: Positive for nausea and vomiting. Negative for abdominal pain, constipation and diarrhea.  Genitourinary: Negative for dysuria, vaginal bleeding and vaginal discharge.    Objective:     BP 130/88   Pulse 74   Wt 208 lb 9.6 oz (94.6 kg)   LMP 11/09/2016   BMI 39.41 kg/m  Physical Exam  Nursing note and vitals reviewed. Constitutional: She is oriented to person, place, and time. She appears well-developed and well-nourished. No distress.  Eyes: No scleral icterus.  Cardiovascular: Normal rate, regular rhythm and normal heart sounds.   Respiratory: Effort normal and breath sounds normal. No respiratory distress.  GI: Soft. She exhibits no distension. There is no tenderness.  Genitourinary: Vagina normal. No breast swelling, tenderness, discharge or bleeding. There is no lesion on the right labia. There is no lesion on the left labia. Uterus is enlarged. Uterus is not tender. Cervix exhibits no motion tenderness, no discharge and no friability. Right adnexum displays no mass and no tenderness. Left adnexum displays no mass and no tenderness. No erythema or bleeding in the vagina. No vaginal discharge found.  Musculoskeletal: She exhibits no edema or tenderness.  Neurological: She is alert and oriented to person, place, and time. She has normal reflexes.  Skin: Skin is warm  and dry.  Psychiatric: She has a normal mood and affect.    Maternal Exam:  Introitus: Vagina is negative for discharge.    Fetal Exam Fetal Monitor Review: Mode: ultrasound.   Baseline rate: Pos FHR x 2.         Assessment:    Pregnancy: [id number] Patient Active Problem List   Diagnosis Date Noted  . Dichorionic diamniotic twin pregnancy, antepartum 01/28/2017  . Supervision of high risk pregnancy, antepartum 02/28/2015  . History of preterm delivery, currently pregnant 02/28/2015   1. Encounter to determine fetal viability of pregnancy, single or unspecified fetus  - US OB Limited; Future  2. Supervision of high risk pregnancy, antepartum  - Cytology - PAP - [name] MFM Fetal Nuchal Translucency; Future - Obstetric Panel, Including HIV - Culture, OB Urine - Enroll Patient in Babyscripts - [name] MFM FETAL NUCHAL TRANS ADDL GEST; Future  3. History of preterm delivery, currently pregnant - 17-P Ordered  4. Dichorionic diamniotic twin pregnancy, antepartum  - Planned Antenatal testing  - [name] MFM FETAL NUCHAL TRANS ADDL GEST; Future  6. Ptyalism  - glycopyrrolate (ROBINUL) 1 MG tablet; Take 1 tablet (1 mg total) by mouth 3 (three) times daily.  Dispense: 90 tablet; Refill: 3--Generic sent    Plan:     Initial labs drawn. Prenatal vitamins. Problem list reviewed and updated. First trimester screen: ordered. Role of ultrasound in pregnancy discussed; fetal survey: requested. Amniocentesis discussed: not indicated. Follow up in 4 weeks.   Dorathy Kinsman 01/28/2017

## 2017-01-29 LAB — OBSTETRIC PANEL, INCLUDING HIV
Antibody Screen: NEGATIVE
Basophils Absolute: 0 10*3/uL (ref 0.0–0.2)
Basos: 0 %
EOS (ABSOLUTE): 0.1 10*3/uL (ref 0.0–0.4)
Eos: 1 %
HEP B S AG: NEGATIVE
HIV Screen 4th Generation wRfx: NONREACTIVE
Hematocrit: 39.4 % (ref 34.0–46.6)
Hemoglobin: 12.9 g/dL (ref 11.1–15.9)
IMMATURE GRANS (ABS): 0 10*3/uL (ref 0.0–0.1)
IMMATURE GRANULOCYTES: 0 %
LYMPHS ABS: 2.4 10*3/uL (ref 0.7–3.1)
LYMPHS: 24 %
MCH: 30.2 pg (ref 26.6–33.0)
MCHC: 32.7 g/dL (ref 31.5–35.7)
MCV: 92 fL (ref 79–97)
MONOCYTES: 6 %
Monocytes Absolute: 0.6 10*3/uL (ref 0.1–0.9)
Neutrophils Absolute: 6.7 10*3/uL (ref 1.4–7.0)
Neutrophils: 69 %
PLATELETS: 252 10*3/uL (ref 150–379)
RBC: 4.27 x10E6/uL (ref 3.77–5.28)
RDW: 13.8 % (ref 12.3–15.4)
RPR: NONREACTIVE
Rh Factor: POSITIVE
Rubella Antibodies, IGG: 1.37 index (ref >0.99)
WBC: 9.8 10*3/uL (ref 3.4–10.8)

## 2017-01-29 LAB — CYTOLOGY - PAP: DIAGNOSIS: NEGATIVE

## 2017-02-03 LAB — URINE CULTURE, OB REFLEX

## 2017-02-03 LAB — CULTURE, OB URINE

## 2017-02-06 ENCOUNTER — Encounter (HOSPITAL_COMMUNITY): Payer: Self-pay | Admitting: Advanced Practice Midwife

## 2017-02-06 DIAGNOSIS — R8271 Bacteriuria: Secondary | ICD-10-CM | POA: Insufficient documentation

## 2017-02-09 ENCOUNTER — Ambulatory Visit (HOSPITAL_COMMUNITY)
Admission: RE | Admit: 2017-02-09 | Discharge: 2017-02-09 | Disposition: A | Discharge status: Home / Self Care | Payer: Medicaid Other | Source: Ambulatory Visit | Attending: Advanced Practice Midwife | Admitting: Advanced Practice Midwife

## 2017-02-09 ENCOUNTER — Encounter (HOSPITAL_COMMUNITY): Payer: Self-pay

## 2017-02-09 DIAGNOSIS — Z3A13 13 weeks gestation of pregnancy: Secondary | ICD-10-CM | POA: Insufficient documentation

## 2017-02-09 DIAGNOSIS — O09899 Supervision of other high risk pregnancies, unspecified trimester: Secondary | ICD-10-CM

## 2017-02-09 DIAGNOSIS — O0990 Supervision of high risk pregnancy, unspecified, unspecified trimester: Secondary | ICD-10-CM | POA: Insufficient documentation

## 2017-02-09 DIAGNOSIS — O09219 Supervision of pregnancy with history of pre-term labor, unspecified trimester: Secondary | ICD-10-CM | POA: Diagnosis not present

## 2017-02-09 DIAGNOSIS — O30049 Twin pregnancy, dichorionic/diamniotic, unspecified trimester: Secondary | ICD-10-CM | POA: Insufficient documentation

## 2017-02-09 DIAGNOSIS — O3680X0 Pregnancy with inconclusive fetal viability, not applicable or unspecified: Secondary | ICD-10-CM | POA: Diagnosis present

## 2017-02-09 DIAGNOSIS — Z3A12 12 weeks gestation of pregnancy: Secondary | ICD-10-CM

## 2017-02-09 DIAGNOSIS — K117 Disturbances of salivary secretion: Secondary | ICD-10-CM

## 2017-02-10 ENCOUNTER — Other Ambulatory Visit: Payer: Self-pay

## 2017-02-11 ENCOUNTER — Other Ambulatory Visit: Payer: Self-pay

## 2017-02-23 ENCOUNTER — Ambulatory Visit (INDEPENDENT_AMBULATORY_CARE_PROVIDER_SITE_OTHER): Payer: Medicaid Other | Admitting: Advanced Practice Midwife

## 2017-02-23 VITALS — BP 109/72 | HR 73 | Wt 213.0 lb

## 2017-02-23 DIAGNOSIS — O0992 Supervision of high risk pregnancy, unspecified, second trimester: Secondary | ICD-10-CM

## 2017-02-23 DIAGNOSIS — O09212 Supervision of pregnancy with history of pre-term labor, second trimester: Secondary | ICD-10-CM | POA: Diagnosis not present

## 2017-02-23 DIAGNOSIS — O30042 Twin pregnancy, dichorionic/diamniotic, second trimester: Secondary | ICD-10-CM | POA: Diagnosis not present

## 2017-02-23 DIAGNOSIS — Z363 Encounter for antenatal screening for malformations: Secondary | ICD-10-CM

## 2017-02-23 DIAGNOSIS — Z23 Encounter for immunization: Secondary | ICD-10-CM | POA: Diagnosis not present

## 2017-02-23 DIAGNOSIS — Z3A18 18 weeks gestation of pregnancy: Secondary | ICD-10-CM

## 2017-02-23 DIAGNOSIS — O0990 Supervision of high risk pregnancy, unspecified, unspecified trimester: Secondary | ICD-10-CM

## 2017-02-23 DIAGNOSIS — O09219 Supervision of pregnancy with history of pre-term labor, unspecified trimester: Secondary | ICD-10-CM

## 2017-02-23 DIAGNOSIS — O09899 Supervision of other high risk pregnancies, unspecified trimester: Secondary | ICD-10-CM

## 2017-02-23 DIAGNOSIS — O30049 Twin pregnancy, dichorionic/diamniotic, unspecified trimester: Secondary | ICD-10-CM

## 2017-02-23 NOTE — Progress Notes (Signed)
   PRENATAL VISIT NOTE  Subjective:  Krystal Chan is a 31 y.o. [id number] at [redacted]w[redacted]d being seen today for ongoing prenatal care.  She is currently monitored for the following issues for this high-risk pregnancy and has Supervision of high risk pregnancy, antepartum; History of preterm delivery, currently pregnant; Dichorionic diamniotic twin pregnancy, antepartum; and GBS bacteriuria on her problem list.  Patient reports no complaints.  Contractions: Not present. Vag. Bleeding: None.  Movement: Absent. Denies leaking of fluid.   The following portions of the patient's history were reviewed and updated as appropriate: allergies, current medications, past family history, past medical history, past social history, past surgical history and problem list. Problem list updated.  Objective:   Vitals:   02/23/17 1052  BP: 109/72  Pulse: 73  Weight: 213 lb (96.6 kg)    Fetal Status: Fetal Heart Rate (bpm): 160/145 Fundal Height: 17 cm Movement: Absent     General:  Alert, oriented and cooperative. Patient is in no acute distress.  Skin: Skin is warm and dry. No rash noted.   Cardiovascular: Normal heart rate noted  Respiratory: Normal respiratory effort, no problems with respiration noted  Abdomen: Soft, gravid, appropriate for gestational age.  Pain/Pressure: Absent     Pelvic: Cervical exam deferred        Extremities: Normal range of motion.  Edema: None  Mental Status:  Normal mood and affect. Normal behavior. Normal judgment and thought content.   Assessment and Plan:  Pregnancy: [id number] at [redacted]w[redacted]d  1. Supervision of high risk pregnancy, antepartum  - Flu Vaccine QUAD 36+ mos IM - [name] MFM OB DETAIL +14 WK; Future - [name] MFM OB DETAIL ADDL GEST +14 WK; Future - Flu vaccine  2. History of preterm delivery, currently pregnant  - Start 17-P next week - [name] MFM OB DETAIL +14 WK; Future - [name] MFM OB DETAIL ADDL GEST +14 WK; Future  3. Dichorionic diamniotic twin pregnancy, antepartum  - [name]  MFM OB DETAIL +14 WK; Future - [name] MFM OB DETAIL ADDL GEST +14 WK; Future  4. [redacted] weeks gestation of pregnancy  - [name] MFM OB DETAIL +14 WK; Future - [name] MFM OB DETAIL ADDL GEST +14 WK; Future  5. Encounter for antenatal screening for malformation using ultrasound  - [name] MFM OB DETAIL +14 WK; Future - [name] MFM OB DETAIL ADDL GEST +14 WK; Future  Preterm labor symptoms and general obstetric precautions including but not limited to vaginal bleeding, contractions, leaking of fluid and fetal movement were reviewed in detail with the patient. Please refer to After Visit Summary for other counseling recommendations.  Return in about 4 weeks (around 03/23/2017) for ROB.   Dorathy Kinsman, CNM

## 2017-02-23 NOTE — Patient Instructions (Signed)
Iron-Rich Diet Iron is a mineral that helps your body to produce hemoglobin. Hemoglobin is a protein in your red blood cells that carries oxygen to your body's tissues. Eating too little iron may cause you to feel weak and tired, and it can increase your risk for infection. Eating enough iron is necessary for your body's metabolism, muscle function, and nervous system. Iron is naturally found in many foods. It can also be added to foods or fortified in foods. There are two types of dietary iron:  Heme iron. Heme iron is absorbed by the body more easily than nonheme iron. Heme iron is found in meat, poultry, and fish.  Nonheme iron. Nonheme iron is found in dietary supplements, iron-fortified grains, beans, and vegetables.  You may need to follow an iron-rich diet if:  You have been diagnosed with iron deficiency or iron-deficiency anemia.  You have a condition that prevents you from absorbing dietary iron, such as: ? Infection in your intestines. ? Celiac disease. This involves long-lasting (chronic) inflammation of your intestines.  You do not eat enough iron.  You eat a diet that is high in foods that impair iron absorption.  You have lost a lot of blood.  You have heavy bleeding during your menstrual cycle.  You are pregnant.  What is my plan? Your health care provider may help you to determine how much iron you need per day based on your condition. Generally, when a person consumes sufficient amounts of iron in the diet, the following iron needs are met:  Men. ? 14-18 years old: 11 mg per day. ? 19-50 years old: 8 mg per day.  Women. ? 14-18 years old: 15 mg per day. ? 19-50 years old: 18 mg per day. ? Over 50 years old: 8 mg per day. ? Pregnant women: 27 mg per day. ? Breastfeeding women: 9 mg per day.  What do I need to know about an iron-rich diet?  Eat fresh fruits and vegetables that are high in vitamin C along with foods that are high in iron. This will help  increase the amount of iron that your body absorbs from food, especially with foods containing nonheme iron. Foods that are high in vitamin C include oranges, peppers, tomatoes, and mango.  Take iron supplements only as directed by your health care provider. Overdose of iron can be life-threatening. If you were prescribed iron supplements, take them with orange juice or a vitamin C supplement.  [place] foods in pots and pans that are made from iron.  Eat nonheme iron-containing foods alongside foods that are high in heme iron. This helps to improve your iron absorption.  Certain foods and drinks contain compounds that impair iron absorption. Avoid eating these foods in the same meal as iron-rich foods or with iron supplements. These include: ? Coffee, black tea, and red wine. ? Milk, dairy products, and foods that are high in calcium. ? Beans, soybeans, and peas. ? Whole grains.  When eating foods that contain both nonheme iron and compounds that impair iron absorption, follow these tips to absorb iron better. ? Soak beans overnight before cooking. ? Soak whole grains overnight and drain them before using. ? Ferment flours before baking, such as using yeast in bread dough. What foods can I eat? Grains Iron-fortified breakfast cereal. Iron-fortified whole-wheat bread. Enriched rice. Sprouted grains. Vegetables Spinach. Potatoes with skin. Green peas. Broccoli. Red and green bell peppers. Fermented vegetables. Fruits Prunes. Raisins. Oranges. Strawberries. Mango. Grapefruit. Meats and Other Protein Sources   Beef liver. Oysters. Beef. Shrimp. [place]. Chicken. [place]. Sardines. Chickpeas. Nuts. Tofu. Beverages Tomato juice. Fresh orange juice. Prune juice. Hibiscus tea. Fortified instant breakfast shakes. Condiments Tahini. Fermented soy sauce. Sweets and Desserts Black-strap molasses. Other Wheat germ. The items listed above may not be a complete list of recommended foods or beverages.  Contact your dietitian for more options. What foods are not recommended? Grains Whole grains. Bran cereal. Bran flour. Oats. Vegetables Artichokes. Brussels sprouts. Kale. Fruits Blueberries. Raspberries. Strawberries. Figs. Meats and Other Protein Sources Soybeans. Products made from soy protein. Dairy Milk. Cream. Cheese. Yogurt. Cottage cheese. Beverages Coffee. Black tea. Red wine. Sweets and Desserts Cocoa. Chocolate. Ice cream. Other Basil. Oregano. Parsley. The items listed above may not be a complete list of foods and beverages to avoid. Contact your dietitian for more information. This information is not intended to replace advice given to you by your health care provider. Make sure you discuss any questions you have with your health care provider. Document Released: 12/09/2004 Document Revised: 11/15/2015 Document Reviewed: 11/22/2013 Elsevier Interactive Patient Education  [employer].

## 2017-02-26 ENCOUNTER — Telehealth: Payer: Self-pay | Admitting: *Deleted

## 2017-02-26 ENCOUNTER — Encounter: Payer: Self-pay | Admitting: *Deleted

## 2017-02-26 NOTE — Telephone Encounter (Signed)
Krystal Chan makena arrived today. I called to notify her but no one answered and also no voicemail answered. Has appt 1023/18 for 17P already scheduled.

## 2017-03-02 ENCOUNTER — Ambulatory Visit (INDEPENDENT_AMBULATORY_CARE_PROVIDER_SITE_OTHER): Payer: Medicaid Other | Admitting: General Practice

## 2017-03-02 VITALS — BP 126/76 | HR 69 | Ht 61.0 in | Wt 217.0 lb

## 2017-03-02 DIAGNOSIS — O09212 Supervision of pregnancy with history of pre-term labor, second trimester: Secondary | ICD-10-CM | POA: Diagnosis present

## 2017-03-02 DIAGNOSIS — O09219 Supervision of pregnancy with history of pre-term labor, unspecified trimester: Principal | ICD-10-CM

## 2017-03-02 DIAGNOSIS — O09899 Supervision of other high risk pregnancies, unspecified trimester: Secondary | ICD-10-CM

## 2017-03-02 MED ORDER — HYDROXYPROGESTERONE CAPROATE 275 MG/1.1ML ~~LOC~~ SOAJ
275.0000 mg | SUBCUTANEOUS | Status: DC
  Administered 2017-03-02 – 2017-05-12 (×11): 275 mg via SUBCUTANEOUS

## 2017-03-09 ENCOUNTER — Ambulatory Visit (INDEPENDENT_AMBULATORY_CARE_PROVIDER_SITE_OTHER): Payer: Medicaid Other | Admitting: *Deleted

## 2017-03-09 VITALS — BP 117/65 | HR 72

## 2017-03-09 DIAGNOSIS — O09899 Supervision of other high risk pregnancies, unspecified trimester: Secondary | ICD-10-CM

## 2017-03-09 DIAGNOSIS — O09212 Supervision of pregnancy with history of pre-term labor, second trimester: Secondary | ICD-10-CM

## 2017-03-09 DIAGNOSIS — O09219 Supervision of pregnancy with history of pre-term labor, unspecified trimester: Principal | ICD-10-CM

## 2017-03-09 NOTE — Progress Notes (Signed)
Here for 17-P. Agree with nurses note.  Thressa ShellerHeather Arlissa Monteverde 11:24 AM 03/09/17

## 2017-03-09 NOTE — Progress Notes (Signed)
Pt presents to clinic for 17-p injection. Tolerated well.

## 2017-03-16 ENCOUNTER — Ambulatory Visit (INDEPENDENT_AMBULATORY_CARE_PROVIDER_SITE_OTHER): Payer: Medicaid Other

## 2017-03-16 DIAGNOSIS — Z8751 Personal history of pre-term labor: Secondary | ICD-10-CM

## 2017-03-16 DIAGNOSIS — O09212 Supervision of pregnancy with history of pre-term labor, second trimester: Secondary | ICD-10-CM | POA: Diagnosis present

## 2017-03-16 NOTE — Progress Notes (Signed)
I agree with the nurses note and plan of care.  Duane Lopeasch, Jennifer I, NP 03/16/2017 4:21 PM

## 2017-03-16 NOTE — Progress Notes (Addendum)
Patient presented to the office for a 17p injection. [name] refill was called in on 03/16/17.

## 2017-03-22 ENCOUNTER — Telehealth: Payer: Self-pay | Admitting: *Deleted

## 2017-03-22 ENCOUNTER — Encounter (HOSPITAL_COMMUNITY): Payer: Self-pay

## 2017-03-22 ENCOUNTER — Other Ambulatory Visit: Payer: Self-pay | Admitting: Advanced Practice Midwife

## 2017-03-22 ENCOUNTER — Ambulatory Visit (HOSPITAL_COMMUNITY)
Admission: RE | Admit: 2017-03-22 | Discharge: 2017-03-22 | Disposition: A | Discharge status: Home / Self Care | Payer: Medicaid Other | Source: Ambulatory Visit | Attending: Advanced Practice Midwife | Admitting: Advanced Practice Midwife

## 2017-03-22 DIAGNOSIS — O0990 Supervision of high risk pregnancy, unspecified, unspecified trimester: Secondary | ICD-10-CM

## 2017-03-22 DIAGNOSIS — O09212 Supervision of pregnancy with history of pre-term labor, second trimester: Secondary | ICD-10-CM | POA: Insufficient documentation

## 2017-03-22 DIAGNOSIS — O99210 Obesity complicating pregnancy, unspecified trimester: Secondary | ICD-10-CM

## 2017-03-22 DIAGNOSIS — O30049 Twin pregnancy, dichorionic/diamniotic, unspecified trimester: Secondary | ICD-10-CM

## 2017-03-22 DIAGNOSIS — O99212 Obesity complicating pregnancy, second trimester: Secondary | ICD-10-CM | POA: Insufficient documentation

## 2017-03-22 DIAGNOSIS — Z363 Encounter for antenatal screening for malformations: Secondary | ICD-10-CM | POA: Insufficient documentation

## 2017-03-22 DIAGNOSIS — O09219 Supervision of pregnancy with history of pre-term labor, unspecified trimester: Secondary | ICD-10-CM

## 2017-03-22 DIAGNOSIS — Z3A19 19 weeks gestation of pregnancy: Secondary | ICD-10-CM | POA: Diagnosis not present

## 2017-03-22 DIAGNOSIS — O09892 Supervision of other high risk pregnancies, second trimester: Secondary | ICD-10-CM | POA: Diagnosis present

## 2017-03-22 DIAGNOSIS — Z3A18 18 weeks gestation of pregnancy: Secondary | ICD-10-CM

## 2017-03-22 DIAGNOSIS — O09899 Supervision of other high risk pregnancies, unspecified trimester: Secondary | ICD-10-CM

## 2017-03-22 DIAGNOSIS — O30042 Twin pregnancy, dichorionic/diamniotic, second trimester: Secondary | ICD-10-CM | POA: Diagnosis present

## 2017-03-22 NOTE — Telephone Encounter (Signed)
Received several calls left on nurse voicemail regarding a physician's order for product replacement for autoinjector malfunction..    [facility]  Was told to call [facility] at [phone].  Was then transferred back to [name] because [facility] couldn't find the patient.  Spoke with person at ext [phone] who requests I fax prescription.  Faxing now to [phone].  Once received they will send replacement.

## 2017-03-23 ENCOUNTER — Other Ambulatory Visit (HOSPITAL_COMMUNITY): Payer: Self-pay | Admitting: *Deleted

## 2017-03-23 DIAGNOSIS — O30049 Twin pregnancy, dichorionic/diamniotic, unspecified trimester: Secondary | ICD-10-CM

## 2017-03-24 ENCOUNTER — Telehealth: Payer: Self-pay | Admitting: General Practice

## 2017-03-24 ENCOUNTER — Encounter: Payer: Self-pay | Admitting: Medical

## 2017-03-24 ENCOUNTER — Ambulatory Visit (INDEPENDENT_AMBULATORY_CARE_PROVIDER_SITE_OTHER): Payer: Medicaid Other | Admitting: Medical

## 2017-03-24 VITALS — BP 134/88 | Wt 222.4 lb

## 2017-03-24 DIAGNOSIS — R8271 Bacteriuria: Secondary | ICD-10-CM

## 2017-03-24 DIAGNOSIS — O0990 Supervision of high risk pregnancy, unspecified, unspecified trimester: Secondary | ICD-10-CM

## 2017-03-24 DIAGNOSIS — O30049 Twin pregnancy, dichorionic/diamniotic, unspecified trimester: Secondary | ICD-10-CM

## 2017-03-24 DIAGNOSIS — O09219 Supervision of pregnancy with history of pre-term labor, unspecified trimester: Secondary | ICD-10-CM

## 2017-03-24 DIAGNOSIS — O09212 Supervision of pregnancy with history of pre-term labor, second trimester: Secondary | ICD-10-CM | POA: Diagnosis present

## 2017-03-24 DIAGNOSIS — O09899 Supervision of other high risk pregnancies, unspecified trimester: Secondary | ICD-10-CM

## 2017-03-24 NOTE — Patient Instructions (Signed)
Second Trimester of Pregnancy The second trimester is from week 13 through week 28, month 4 through 6. This is often the time in pregnancy that you feel your best. Often times, morning sickness has lessened or quit. You may have more energy, and you may get hungry more often. Your unborn baby (fetus) is growing rapidly. At the end of the sixth month, he or she is about 9 inches long and weighs about 1 pounds. You will likely feel the baby move (quickening) between 18 and 20 weeks of pregnancy. Follow these instructions at home:  Avoid all smoking, herbs, and alcohol. Avoid drugs not approved by your doctor.  Do not use any tobacco products, including cigarettes, chewing tobacco, and electronic cigarettes. If you need help quitting, ask your doctor. You may get counseling or other support to help you quit.  Only take medicine as told by your doctor. Some medicines are safe and some are not during pregnancy.  Exercise only as told by your doctor. Stop exercising if you start having cramps.  Eat regular, healthy meals.  Wear a good support bra if your breasts are tender.  Do not use hot tubs, steam rooms, or saunas.  Wear your seat belt when driving.  Avoid raw meat, uncooked cheese, and liter boxes and soil used by cats.  Take your prenatal vitamins.  Take 1500-2000 milligrams of calcium daily starting at the 20th week of pregnancy until you deliver your baby.  Try taking medicine that helps you poop (stool softener) as needed, and if your doctor approves. Eat more fiber by eating fresh fruit, vegetables, and whole grains. Drink enough fluids to keep your pee (urine) clear or pale yellow.  Take warm water baths (sitz baths) to soothe pain or discomfort caused by hemorrhoids. Use hemorrhoid cream if your doctor approves.  If you have puffy, bulging veins (varicose veins), wear support hose. Raise (elevate) your feet for 15 minutes, 3-4 times a day. Limit salt in your diet.  Avoid heavy  lifting, wear low heals, and sit up straight.  Rest with your legs raised if you have leg cramps or low back pain.  Visit your dentist if you have not gone during your pregnancy. Use a soft toothbrush to brush your teeth. Be gentle when you floss.  You can have sex (intercourse) unless your doctor tells you not to.  Go to your doctor visits. Get help if:  You feel dizzy.  You have mild cramps or pressure in your lower belly (abdomen).  You have a nagging pain in your belly area.  You continue to feel sick to your stomach (nauseous), throw up (vomit), or have watery poop (diarrhea).  You have bad smelling fluid coming from your vagina.  You have pain with peeing (urination). Get help right away if:  You have a fever.  You are leaking fluid from your vagina.  You have spotting or bleeding from your vagina.  You have severe belly cramping or pain.  You lose or gain weight rapidly.  You have trouble catching your breath and have chest pain.  You notice sudden or extreme puffiness (swelling) of your face, hands, ankles, feet, or legs.  You have not felt the baby move in over an hour.  You have severe headaches that do not go away with medicine.  You have vision changes. This information is not intended to replace advice given to you by your health care provider. Make sure you discuss any questions you have with your health care   provider. Document Released: 07/22/2009 Document Revised: 10/03/2015 Document Reviewed: 06/28/2012 Elsevier Interactive Patient Education  2017 Elsevier Inc.  

## 2017-03-24 NOTE — Progress Notes (Signed)
Pt stated the injection make her itch/knot last for 1 week.

## 2017-03-24 NOTE — Telephone Encounter (Signed)
Called patient & informed her Krystal Chan has arrived in office. Patient verbalized understanding & states she will come back to the office later this afternoon to receive injection. Patient had no questions

## 2017-03-26 NOTE — Progress Notes (Signed)
   PRENATAL VISIT NOTE  Subjective:  Krystal Chan is a 31 y.o. [id number] at 6523w4d being seen today for ongoing prenatal care.  She is currently monitored for the following issues for this high-risk pregnancy and has Supervision of high risk pregnancy, antepartum; History of preterm delivery, currently pregnant; Dichorionic diamniotic twin pregnancy, antepartum; and GBS bacteriuria on their problem list.  Patient reports no complaints.  Contractions: Not present. Vag. Bleeding: None.  Movement: Absent. Denies leaking of fluid.   The following portions of the patient's history were reviewed and updated as appropriate: allergies, current medications, past family history, past medical history, past social history, past surgical history and problem list. Problem list updated.  Objective:   Vitals:   03/24/17 1157  BP: 134/88  Weight: 222 lb 6.4 oz (100.9 kg)    Fetal Status: Fetal Heart Rate (bpm): 157/148   Movement: Absent     General:  Alert, oriented and cooperative. Patient is in no acute distress.  Skin: Skin is warm and dry. No rash noted.   Cardiovascular: Normal heart rate noted  Respiratory: Normal respiratory effort, no problems with respiration noted  Abdomen: Soft, gravid, appropriate for gestational age.  Pain/Pressure: Absent     Pelvic: Cervical exam deferred        Extremities: Normal range of motion.  Edema: None  Mental Status:  Normal mood and affect. Normal behavior. Normal judgment and thought content.   Assessment and Plan:  Pregnancy: [id number] at 3023w4d  1. GBS bacteriuria - Will treat in labor  2. Dichorionic diamniotic twin pregnancy, antepartum  3. Supervision of high risk pregnancy, antepartum  4. History of preterm delivery, currently pregnant - on 17-P weekly, not available today, will call patient later today or tomorrow when it arrives - patient feels she may be having an allergic reaction, Lyla SonCarrie RN will try to get generic approved to try  Preterm  labor symptoms and general obstetric precautions including but not limited to vaginal bleeding, contractions, leaking of fluid and fetal movement were reviewed in detail with the patient. Please refer to After Visit Summary for other counseling recommendations.  [facility] about 4 weeks (around 04/21/2017) [facility]   Vonzella NippleJulie Adeliz Tonkinson, PA-C

## 2017-03-31 ENCOUNTER — Ambulatory Visit (INDEPENDENT_AMBULATORY_CARE_PROVIDER_SITE_OTHER): Payer: Medicaid Other | Admitting: General Practice

## 2017-03-31 DIAGNOSIS — O09212 Supervision of pregnancy with history of pre-term labor, second trimester: Secondary | ICD-10-CM

## 2017-03-31 DIAGNOSIS — O09899 Supervision of other high risk pregnancies, unspecified trimester: Secondary | ICD-10-CM

## 2017-03-31 DIAGNOSIS — O09219 Supervision of pregnancy with history of pre-term labor, unspecified trimester: Principal | ICD-10-CM

## 2017-03-31 NOTE — Progress Notes (Signed)
Injection given by RN. Agree with nursing staff's documentation of this patient's clinic encounter.  Krystal CollinsUgonna Soffia Doshier, MD 03/31/2017 5:01 PM

## 2017-04-07 ENCOUNTER — Ambulatory Visit: Payer: Medicaid Other

## 2017-04-08 ENCOUNTER — Inpatient Hospital Stay (HOSPITAL_COMMUNITY)
Admission: AD | Admit: 2017-04-08 | Discharge: 2017-04-08 | Disposition: A | Discharge status: Home / Self Care | Payer: Medicaid Other | Source: Ambulatory Visit | Attending: Obstetrics & Gynecology | Admitting: Obstetrics & Gynecology

## 2017-04-08 ENCOUNTER — Encounter (HOSPITAL_COMMUNITY): Payer: Self-pay | Admitting: *Deleted

## 2017-04-08 ENCOUNTER — Other Ambulatory Visit: Payer: Self-pay

## 2017-04-08 ENCOUNTER — Ambulatory Visit (INDEPENDENT_AMBULATORY_CARE_PROVIDER_SITE_OTHER): Payer: Medicaid Other | Admitting: General Practice

## 2017-04-08 VITALS — BP 126/69 | HR 75 | Ht 61.0 in | Wt 226.0 lb

## 2017-04-08 DIAGNOSIS — Z9889 Other specified postprocedural states: Secondary | ICD-10-CM | POA: Diagnosis not present

## 2017-04-08 DIAGNOSIS — O26892 Other specified pregnancy related conditions, second trimester: Secondary | ICD-10-CM | POA: Diagnosis not present

## 2017-04-08 DIAGNOSIS — O09219 Supervision of pregnancy with history of pre-term labor, unspecified trimester: Principal | ICD-10-CM

## 2017-04-08 DIAGNOSIS — Z833 Family history of diabetes mellitus: Secondary | ICD-10-CM | POA: Diagnosis not present

## 2017-04-08 DIAGNOSIS — R109 Unspecified abdominal pain: Secondary | ICD-10-CM

## 2017-04-08 DIAGNOSIS — Z803 Family history of malignant neoplasm of breast: Secondary | ICD-10-CM | POA: Insufficient documentation

## 2017-04-08 DIAGNOSIS — O09899 Supervision of other high risk pregnancies, unspecified trimester: Secondary | ICD-10-CM

## 2017-04-08 DIAGNOSIS — R8271 Bacteriuria: Secondary | ICD-10-CM | POA: Insufficient documentation

## 2017-04-08 DIAGNOSIS — R102 Pelvic and perineal pain: Secondary | ICD-10-CM | POA: Diagnosis not present

## 2017-04-08 DIAGNOSIS — O9989 Other specified diseases and conditions complicating pregnancy, childbirth and the puerperium: Secondary | ICD-10-CM

## 2017-04-08 DIAGNOSIS — Z8249 Family history of ischemic heart disease and other diseases of the circulatory system: Secondary | ICD-10-CM | POA: Diagnosis not present

## 2017-04-08 DIAGNOSIS — S334XXA Traumatic rupture of symphysis pubis, initial encounter: Secondary | ICD-10-CM | POA: Diagnosis not present

## 2017-04-08 DIAGNOSIS — X58XXXA Exposure to other specified factors, initial encounter: Secondary | ICD-10-CM | POA: Insufficient documentation

## 2017-04-08 DIAGNOSIS — Z79899 Other long term (current) drug therapy: Secondary | ICD-10-CM | POA: Diagnosis not present

## 2017-04-08 DIAGNOSIS — O30042 Twin pregnancy, dichorionic/diamniotic, second trimester: Secondary | ICD-10-CM | POA: Insufficient documentation

## 2017-04-08 DIAGNOSIS — Z3A21 21 weeks gestation of pregnancy: Secondary | ICD-10-CM | POA: Insufficient documentation

## 2017-04-08 DIAGNOSIS — O09212 Supervision of pregnancy with history of pre-term labor, second trimester: Secondary | ICD-10-CM

## 2017-04-08 DIAGNOSIS — O30049 Twin pregnancy, dichorionic/diamniotic, unspecified trimester: Secondary | ICD-10-CM

## 2017-04-08 LAB — URINALYSIS, ROUTINE W REFLEX MICROSCOPIC
BILIRUBIN URINE: NEGATIVE
Glucose, UA: NEGATIVE mg/dL
HGB URINE DIPSTICK: NEGATIVE
Ketones, ur: NEGATIVE mg/dL
Leukocytes, UA: NEGATIVE
Nitrite: NEGATIVE
PH: 6 (ref 5.0–8.0)
Protein, ur: NEGATIVE mg/dL
SPECIFIC GRAVITY, URINE: 1.019 (ref 1.005–1.030)

## 2017-04-08 LAB — WET PREP, GENITAL
SPERM: NONE SEEN
TRICH WET PREP: NONE SEEN
Yeast Wet Prep HPF POC: NONE SEEN

## 2017-04-08 NOTE — MAU Provider Note (Signed)
History     CSN: [id number]  Arrival date and time: 04/08/17 2004   First Provider Initiated Contact with Patient 04/08/17 2037      Chief Complaint  Patient presents with  . Pelvic Pain   Pelvic Pain  The patient's primary symptoms include pelvic pain. The patient's pertinent negatives include no vaginal discharge. This is a new problem. Episode onset: around 03/29/17  The problem occurs intermittently. The problem has been unchanged. Pain severity now: 4/10  The problem affects both sides. She is pregnant. Pertinent negatives include no chills, dysuria, fever, frequency, nausea, urgency or vomiting. The vaginal discharge was normal. There has been no bleeding. Exacerbated by: walking, getting out of the car.  She has tried acetaminophen for the symptoms. The treatment provided moderate relief. She is not sexually active.   Past Medical History:  Diagnosis Date  . History of preterm delivery, currently pregnant 02/28/2015   [ ] 17-P    Past Surgical History:  Procedure Laterality Date  . HERNIA REPAIR     gastic hernia 2005 and umbilical hernia 2004    Family History  Problem Relation Age of Onset  . Breast cancer Maternal Grandmother   . Hypertension Maternal Grandmother   . Cancer Maternal Grandmother        breast  . Diabetes Maternal Grandfather   . Hypertension Mother   . Heart disease Mother   . Hypertension Paternal Grandmother   . Diabetes Paternal Grandmother   . Diabetes Father   . Hearing loss Neg Hx     Social History   Tobacco Use  . Smoking status: Never Smoker  . Smokeless tobacco: Never Used  Substance Use Topics  . Alcohol use: No    Comment: ocassionally  . Drug use: No    Allergies: No Known Allergies  Facility-Administered Medications Prior to Admission  Medication Dose Route Frequency Provider Last Rate Last Dose  . HYDROXYprogesterone Caproate SOAJ 275 mg  275 mg Subcutaneous Weekly Reva BoresPratt, Tanya S, MD   275 mg at 04/08/17 1512    Medications Prior to Admission  Medication Sig Dispense Refill Last Dose  . Prenat w/o A Vit-FeFum-FePo-FA (CONCEPT OB) 130-92.4-1 MG CAPS Take 1 tablet by mouth daily. 30 capsule 12 04/08/2017 at Unknown time  . glycopyrrolate (ROBINUL) 1 MG tablet Take 1 tablet (1 mg total) by mouth 3 (three) times daily. (Patient not taking: Reported on 02/09/2017) 90 tablet 3 Unknown at Unknown time  . promethazine (PHENERGAN) 25 MG tablet Take 0.5-1 tablets (12.5-25 mg total) by mouth every 6 (six) hours as needed. (Patient not taking: Reported on 01/28/2017) 30 tablet 0 Unknown at Unknown time    Review of Systems  Constitutional: Negative for chills and fever.  Gastrointestinal: Negative for nausea and vomiting.  Genitourinary: Positive for pelvic pain. Negative for dysuria, frequency, urgency, vaginal bleeding and vaginal discharge.   Physical Exam   Blood pressure 123/73, pulse 85, temperature 99.4 F (37.4 C), temperature source Oral, resp. rate 18, height 5\' 1"  (1.549 m), weight 227 lb 8 oz (103.2 kg), last menstrual period 11/09/2016, unknown if currently breastfeeding.  Physical Exam  Nursing note and vitals reviewed. Constitutional: She is oriented to person, place, and time. She appears well-developed and well-nourished. No distress.  HENT:  Head: Normocephalic.  Cardiovascular: Normal rate.  Respiratory: Effort normal.  GI: Soft. There is no tenderness. There is no rebound.  Genitourinary:  Genitourinary Comments:  External: no lesion Vagina: small amount of white discharge Cervix: pink, smooth, closed/thick  Uterus: AGA,  FHR a: 150 FHR b: 130    Neurological: She is alert and oriented to person, place, and time.  Skin: Skin is warm and dry.  Psychiatric: She has a normal mood and affect.   Results for orders placed or performed during the hospital encounter of 04/08/17 (from the past 24 hour(s))  Urinalysis, Routine w reflex microscopic     Status: None   Collection Time:  04/08/17  8:15 PM  Result Value Ref Range   Color, Urine YELLOW YELLOW   APPearance CLEAR CLEAR   Specific Gravity, Urine 1.019 1.005 - 1.030   pH 6.0 5.0 - 8.0   Glucose, UA NEGATIVE NEGATIVE mg/dL   Hgb urine dipstick NEGATIVE NEGATIVE   Bilirubin Urine NEGATIVE NEGATIVE   Ketones, ur NEGATIVE NEGATIVE mg/dL   Protein, ur NEGATIVE NEGATIVE mg/dL   Nitrite NEGATIVE NEGATIVE   Leukocytes, UA NEGATIVE NEGATIVE  Wet prep, genital     Status: Abnormal   Collection Time: 04/08/17  8:48 PM  Result Value Ref Range   Yeast Wet Prep HPF POC NONE SEEN NONE SEEN   Trich, Wet Prep NONE SEEN NONE SEEN   Clue Cells Wet Prep HPF POC PRESENT (A) NONE SEEN   WBC, Wet Prep HPF POC MODERATE (A) NONE SEEN   Sperm NONE SEEN      MAU Course  Procedures  MDM   Assessment and Plan   1. Dislocation of symphysis pubis, initial encounter   2. GBS bacteriuria   3. Dichorionic diamniotic twin pregnancy, antepartum   4. [redacted] weeks gestation of pregnancy    DC home Comfort measures reviewed  2nd/3rd Trimester precautions  PTL precautions  Fetal kick counts RX: none, recc maternity support belt  Return to MAU as needed FU with OB as planned  Follow-up Information    [facility].   Specialty:  Obstetrics and Gynecology Contact information: [address] [place] [id number] [phone]           Thressa ShellerHeather Kesler Wickham 04/08/2017, 8:39 PM

## 2017-04-08 NOTE — Progress Notes (Signed)
Chart reviewed for nurse visit. Agree with plan of care.   Marylene LandKooistra, Chrisotpher Rivero Lorraine, CNM 04/08/2017 4:53 PM

## 2017-04-08 NOTE — MAU Note (Signed)
PT SAYS  SHE HAS PELVIC  PRESSURE - STARTED ON Monday-  HAS TWINS.     TAKING TYLENOL EVERYDAY-     HURTS  TO WALK.  [facility]-  [facility] .   LAST SEX-  SEPT.

## 2017-04-08 NOTE — Progress Notes (Signed)
17p injection given.  

## 2017-04-08 NOTE — Discharge Instructions (Signed)
Get a pregnancy support belt; use ice and Tylenol.

## 2017-04-09 LAB — GC/CHLAMYDIA PROBE AMP (~~LOC~~) NOT AT ARMC
Chlamydia: NEGATIVE
NEISSERIA GONORRHEA: NEGATIVE

## 2017-04-14 ENCOUNTER — Inpatient Hospital Stay (HOSPITAL_COMMUNITY)
Admission: AD | Admit: 2017-04-14 | Discharge: 2017-04-14 | Disposition: A | Discharge status: Home / Self Care | Payer: Medicaid Other | Source: Ambulatory Visit | Attending: Family Medicine | Admitting: Family Medicine

## 2017-04-14 ENCOUNTER — Encounter (HOSPITAL_COMMUNITY): Payer: Self-pay | Admitting: *Deleted

## 2017-04-14 ENCOUNTER — Other Ambulatory Visit: Payer: Self-pay

## 2017-04-14 ENCOUNTER — Ambulatory Visit (INDEPENDENT_AMBULATORY_CARE_PROVIDER_SITE_OTHER): Payer: Medicaid Other | Admitting: General Practice

## 2017-04-14 DIAGNOSIS — N760 Acute vaginitis: Secondary | ICD-10-CM | POA: Diagnosis not present

## 2017-04-14 DIAGNOSIS — N898 Other specified noninflammatory disorders of vagina: Secondary | ICD-10-CM

## 2017-04-14 DIAGNOSIS — O23592 Infection of other part of genital tract in pregnancy, second trimester: Secondary | ICD-10-CM | POA: Insufficient documentation

## 2017-04-14 DIAGNOSIS — O30049 Twin pregnancy, dichorionic/diamniotic, unspecified trimester: Secondary | ICD-10-CM | POA: Diagnosis not present

## 2017-04-14 DIAGNOSIS — B9689 Other specified bacterial agents as the cause of diseases classified elsewhere: Secondary | ICD-10-CM | POA: Diagnosis not present

## 2017-04-14 DIAGNOSIS — O26892 Other specified pregnancy related conditions, second trimester: Secondary | ICD-10-CM | POA: Diagnosis not present

## 2017-04-14 DIAGNOSIS — O09212 Supervision of pregnancy with history of pre-term labor, second trimester: Secondary | ICD-10-CM

## 2017-04-14 DIAGNOSIS — O30042 Twin pregnancy, dichorionic/diamniotic, second trimester: Secondary | ICD-10-CM | POA: Insufficient documentation

## 2017-04-14 DIAGNOSIS — Z3A22 22 weeks gestation of pregnancy: Secondary | ICD-10-CM | POA: Diagnosis not present

## 2017-04-14 DIAGNOSIS — O09899 Supervision of other high risk pregnancies, unspecified trimester: Secondary | ICD-10-CM

## 2017-04-14 DIAGNOSIS — O09219 Supervision of pregnancy with history of pre-term labor, unspecified trimester: Principal | ICD-10-CM

## 2017-04-14 HISTORY — DX: Umbilical hernia without obstruction or gangrene: K42.9

## 2017-04-14 LAB — URINALYSIS, ROUTINE W REFLEX MICROSCOPIC
Bilirubin Urine: NEGATIVE
Glucose, UA: NEGATIVE mg/dL
Hgb urine dipstick: NEGATIVE
KETONES UR: NEGATIVE mg/dL
LEUKOCYTES UA: NEGATIVE
NITRITE: NEGATIVE
PROTEIN: NEGATIVE mg/dL
SPECIFIC GRAVITY, URINE: 1.017 (ref 1.005–1.030)
pH: 6 (ref 5.0–8.0)

## 2017-04-14 LAB — WET PREP, GENITAL
Sperm: NONE SEEN
TRICH WET PREP: NONE SEEN
YEAST WET PREP: NONE SEEN

## 2017-04-14 LAB — AMNISURE RUPTURE OF MEMBRANE (ROM) NOT AT ARMC: Amnisure ROM: NEGATIVE

## 2017-04-14 LAB — POCT FERN TEST: POCT Fern Test: NEGATIVE

## 2017-04-14 MED ORDER — METRONIDAZOLE 500 MG PO TABS: 500.0000 mg | ORAL_TABLET | Freq: Two times a day (BID) | ORAL | 0 refills | Status: DC

## 2017-04-14 NOTE — MAU Note (Signed)
Noted last night, that when she wiped there was a string of clear mucous.  Same again this morning, but heavier, and one more time today.  No bleeding, no leaking, no pain.

## 2017-04-14 NOTE — Progress Notes (Signed)
17p injection given. Patient reports an increase in mucous discharge. Patient denies bleeding, pain, odor & irritation. Reassured patient discharge can be normal with pregnancy. Patient verbalized understanding

## 2017-04-14 NOTE — MAU Note (Signed)
Urine in lab 

## 2017-04-14 NOTE — MAU Provider Note (Signed)
History     CSN: [id number]  Arrival date and time: 04/14/17 1525   First Provider Initiated Contact with Patient 04/14/17 1712      Chief Complaint  Patient presents with  . Vaginal Discharge   HPI    [name] is a 31 y.o. female [id number] @ 4465w2d with Mercie Eoni Di twins here in MAU with vaginal discharge. [facility] and was sent here for further evaluation.  She first noticed a changed in her discharge yesterday evening. When she uses the bathroom and when she wipes herself the discharge is clear, and snot like. This morning she again used the bathroom and noticed the discharge had increased in the amount.  No gush of fluid. No vaginal bleeding. No odor. + fetal movement X 2  OB History    Gravida Para Term Preterm AB Living   6 3 2 1 2 3    SAB TAB Ectopic Multiple Live Births     2   0 3      Past Medical History:  Diagnosis Date  . History of preterm delivery, currently pregnant 02/28/2015   [ ] 17-P  . Umbilical hernia    repaired ~2004    Past Surgical History:  Procedure Laterality Date  . HERNIA REPAIR     gastic hernia 2005 and umbilical hernia 2004    Family History  Problem Relation Age of Onset  . Breast cancer Maternal Grandmother   . Hypertension Maternal Grandmother   . Cancer Maternal Grandmother        breast  . Hypertension Mother   . Heart disease Mother   . Hypertension Paternal Grandmother   . Diabetes Paternal Grandmother   . Diabetes Father   . Hearing loss Neg Hx     Social History   Tobacco Use  . Smoking status: Never Smoker  . Smokeless tobacco: Never Used  Substance Use Topics  . Alcohol use: No    Comment: ocassionally  . Drug use: No    Allergies: No Known Allergies  Facility-Administered Medications Prior to Admission  Medication Dose Route Frequency Provider Last Rate Last Dose  . HYDROXYprogesterone Caproate SOAJ 275 mg  275 mg Subcutaneous Weekly Reva BoresPratt, Tanya S, MD   275 mg at 04/14/17  1518   Medications Prior to Admission  Medication Sig Dispense Refill Last Dose  . Prenat w/o A Vit-FeFum-FePo-FA (CONCEPT OB) 130-92.4-1 MG CAPS Take 1 tablet by mouth daily. 30 capsule 12 04/14/2017 at Unknown time  . glycopyrrolate (ROBINUL) 1 MG tablet Take 1 tablet (1 mg total) by mouth 3 (three) times daily. (Patient not taking: Reported on 02/09/2017) 90 tablet 3 Unknown at Unknown time  . promethazine (PHENERGAN) 25 MG tablet Take 0.5-1 tablets (12.5-25 mg total) by mouth every 6 (six) hours as needed. (Patient not taking: Reported on 01/28/2017) 30 tablet 0 Unknown at Unknown time   Results for orders placed or performed during the hospital encounter of 04/14/17 (from the past 48 hour(s))  Urinalysis, Routine w reflex microscopic     Status: None   Collection Time: 04/14/17  3:45 PM  Result Value Ref Range   Color, Urine YELLOW YELLOW   APPearance CLEAR CLEAR   Specific Gravity, Urine 1.017 1.005 - 1.030   pH 6.0 5.0 - 8.0   Glucose, UA NEGATIVE NEGATIVE mg/dL   Hgb urine dipstick NEGATIVE NEGATIVE   Bilirubin Urine NEGATIVE NEGATIVE   Ketones, ur NEGATIVE NEGATIVE mg/dL   Protein, ur NEGATIVE  NEGATIVE mg/dL   Nitrite NEGATIVE NEGATIVE   Leukocytes, UA NEGATIVE NEGATIVE  Wet prep, genital     Status: Abnormal   Collection Time: 04/14/17  5:20 PM  Result Value Ref Range   Yeast Wet Prep HPF POC NONE SEEN NONE SEEN   Trich, Wet Prep NONE SEEN NONE SEEN   Clue Cells Wet Prep HPF POC PRESENT (A) NONE SEEN   WBC, Wet Prep HPF POC MANY (A) NONE SEEN    Comment: MANY BACTERIA SEEN   Sperm NONE SEEN   Amnisure rupture of membrane (rom)[facility]     Status: None   Collection Time: 04/14/17  5:20 PM  Result Value Ref Range   Amnisure ROM NEGATIVE   POCT fern test     Status: None   Collection Time: 04/14/17  5:22 PM  Result Value Ref Range   POCT Fern Test Negative = intact amniotic membranes     Review of Systems  Gastrointestinal: Negative for abdominal pain.   Genitourinary: Positive for vaginal discharge. Negative for vaginal bleeding.   Physical Exam   Blood pressure 120/75, pulse 92, temperature 97.9 F (36.6 C), temperature source Oral, resp. rate 18, last menstrual period 11/09/2016, unknown if currently breastfeeding.  Physical Exam  Constitutional: She is oriented to person, place, and time. She appears well-developed and well-nourished. No distress.  HENT:  Head: Normocephalic.  Eyes: Pupils are equal, round, and reactive to light.  GI: Soft. She exhibits no distension. There is no tenderness. There is no rebound.  Genitourinary:  Genitourinary Comments: Vagina - Small-moderate amount of white vaginal discharge, no odor. No pooling of clear fluid  Cervix - No contact bleeding, no active bleeding, visually closed.  GC/Chlam, wet prep done Chaperone present for exam.   Musculoskeletal: Normal range of motion.  Neurological: She is alert and oriented to person, place, and time.  Skin: Skin is warm. She is not diaphoretic.  Psychiatric: Her behavior is normal.   MAU Course  Procedures  None  MDM  + fetal heart tones via doppler  Wet prep and GC Amnisure negative Fern slide negative  Discussed results in detail with patient.   Assessment and Plan   A:  1. BV (bacterial vaginosis)   2. Dichorionic diamniotic twin pregnancy, antepartum   3. Vaginal discharge in pregnancy in second trimester   4. [redacted] weeks gestation of pregnancy     P:  Discharge home in stable condition Rx: Flagyl Return to MAU if symptoms worsen Follow up with [facility]   Krystal Chan, Harolyn RutherfordJennifer I, NP 04/14/2017 6:01 PM

## 2017-04-14 NOTE — Discharge Instructions (Signed)

## 2017-04-15 NOTE — Progress Notes (Signed)
Agree with nursing staff's documentation of this patient's clinic encounter.  Vonzella NippleJulie Haevyn Ury, PA-C 04/15/2017 11:18 AM

## 2017-04-20 ENCOUNTER — Other Ambulatory Visit (HOSPITAL_COMMUNITY): Payer: Self-pay | Admitting: Maternal and Fetal Medicine

## 2017-04-20 ENCOUNTER — Ambulatory Visit (HOSPITAL_COMMUNITY)
Admission: RE | Admit: 2017-04-20 | Discharge: 2017-04-20 | Disposition: A | Discharge status: Home / Self Care | Payer: Medicaid Other | Source: Ambulatory Visit | Attending: Advanced Practice Midwife | Admitting: Advanced Practice Midwife

## 2017-04-20 ENCOUNTER — Encounter (HOSPITAL_COMMUNITY): Payer: Self-pay

## 2017-04-20 DIAGNOSIS — R8271 Bacteriuria: Secondary | ICD-10-CM

## 2017-04-20 DIAGNOSIS — Z362 Encounter for other antenatal screening follow-up: Secondary | ICD-10-CM

## 2017-04-20 DIAGNOSIS — O30042 Twin pregnancy, dichorionic/diamniotic, second trimester: Secondary | ICD-10-CM | POA: Insufficient documentation

## 2017-04-20 DIAGNOSIS — O09212 Supervision of pregnancy with history of pre-term labor, second trimester: Secondary | ICD-10-CM | POA: Insufficient documentation

## 2017-04-20 DIAGNOSIS — O30049 Twin pregnancy, dichorionic/diamniotic, unspecified trimester: Secondary | ICD-10-CM

## 2017-04-20 DIAGNOSIS — E669 Obesity, unspecified: Secondary | ICD-10-CM | POA: Insufficient documentation

## 2017-04-20 DIAGNOSIS — O99212 Obesity complicating pregnancy, second trimester: Secondary | ICD-10-CM | POA: Diagnosis not present

## 2017-04-20 DIAGNOSIS — Z6841 Body Mass Index (BMI) 40.0 and over, adult: Secondary | ICD-10-CM | POA: Diagnosis not present

## 2017-04-20 DIAGNOSIS — Z3A23 23 weeks gestation of pregnancy: Secondary | ICD-10-CM

## 2017-04-20 NOTE — Addendum Note (Signed)
Encounter addended by: Vivien RotaSmall, Shamyia Grandpre H, RT on: 04/20/2017 3:36 PM  Actions taken: Imaging Exam ended

## 2017-04-21 ENCOUNTER — Encounter: Payer: Self-pay | Admitting: Obstetrics and Gynecology

## 2017-04-21 ENCOUNTER — Ambulatory Visit (INDEPENDENT_AMBULATORY_CARE_PROVIDER_SITE_OTHER): Payer: Medicaid Other | Admitting: Obstetrics and Gynecology

## 2017-04-21 VITALS — BP 131/68 | HR 98 | Wt 230.0 lb

## 2017-04-21 DIAGNOSIS — O09219 Supervision of pregnancy with history of pre-term labor, unspecified trimester: Secondary | ICD-10-CM

## 2017-04-21 DIAGNOSIS — O30049 Twin pregnancy, dichorionic/diamniotic, unspecified trimester: Secondary | ICD-10-CM

## 2017-04-21 DIAGNOSIS — O09212 Supervision of pregnancy with history of pre-term labor, second trimester: Secondary | ICD-10-CM

## 2017-04-21 DIAGNOSIS — O0992 Supervision of high risk pregnancy, unspecified, second trimester: Secondary | ICD-10-CM

## 2017-04-21 DIAGNOSIS — R8271 Bacteriuria: Secondary | ICD-10-CM

## 2017-04-21 DIAGNOSIS — O09899 Supervision of other high risk pregnancies, unspecified trimester: Secondary | ICD-10-CM

## 2017-04-21 DIAGNOSIS — O30042 Twin pregnancy, dichorionic/diamniotic, second trimester: Secondary | ICD-10-CM

## 2017-04-21 DIAGNOSIS — O0990 Supervision of high risk pregnancy, unspecified, unspecified trimester: Secondary | ICD-10-CM

## 2017-04-21 MED ORDER — COMFORT FIT MATERNITY SUPP MED MISC: 0 refills | Status: DC

## 2017-04-21 NOTE — Progress Notes (Signed)
   PRENATAL VISIT NOTE  Subjective:  Krystal Chan is a 31 y.o. [id number] at 8480w2d being seen today for ongoing prenatal care.  She is currently monitored for the following issues for this high-risk pregnancy and has Supervision of high risk pregnancy, antepartum; History of preterm delivery, currently pregnant; Dichorionic diamniotic twin pregnancy, antepartum; and GBS bacteriuria on their problem list.  Patient reports backache.  Contractions: Not present. Vag. Bleeding: None.  Movement: Present. Denies leaking of fluid.   The following portions of the patient's history were reviewed and updated as appropriate: allergies, current medications, past family history, past medical history, past social history, past surgical history and problem list. Problem list updated.  Objective:   Vitals:   04/21/17 1107  Weight: 230 lb (104.3 kg)    Fetal Status: Fetal Heart Rate (bpm): 148/156   Movement: Present     General:  Alert, oriented and cooperative. Patient is in no acute distress.  Skin: Skin is warm and dry. No rash noted.   Cardiovascular: Normal heart rate noted  Respiratory: Normal respiratory effort, no problems with respiration noted  Abdomen: Soft, gravid, appropriate for gestational age.  Pain/Pressure: Present     Pelvic: Cervical exam deferred        Extremities: Normal range of motion.  Edema: None  Mental Status:  Normal mood and affect. Normal behavior. Normal judgment and thought content.   Assessment and Plan:  Pregnancy: [id number] at 5980w2d  1. Dichorionic diamniotic twin pregnancy, antepartum Normal growth 12/11 Follow up growth on 1/8  2. Supervision of high risk pregnancy, antepartum Patient is doing well without complaints Third trimester labs next visit Maternity belt provided to help with back pain and pelvic pressure  3. GBS bacteriuria Will provide prophylaxis in labor  4. History of preterm delivery, currently pregnant Continue weekly 17-P  Preterm labor  symptoms and general obstetric precautions including but not limited to vaginal bleeding, contractions, leaking of fluid and fetal movement were reviewed in detail with the patient. Please refer to After Visit Summary for other counseling recommendations.  Return in about 4 weeks (around 05/19/2017) for ROB, 2 hr glucola next visit.   Catalina AntiguaPeggy Yee Gangi, MD

## 2017-04-21 NOTE — Progress Notes (Signed)
Contacted [name] in regards to refill on pt's medication.  [name] gave me [facility] # [phone].  I spoke with Marchelle FolksAmanda from [facility] who informed me that she will send out pt's refill asap.

## 2017-04-28 ENCOUNTER — Ambulatory Visit (INDEPENDENT_AMBULATORY_CARE_PROVIDER_SITE_OTHER): Payer: Medicaid Other | Admitting: General Practice

## 2017-04-28 DIAGNOSIS — O09212 Supervision of pregnancy with history of pre-term labor, second trimester: Secondary | ICD-10-CM | POA: Diagnosis present

## 2017-04-28 DIAGNOSIS — O09219 Supervision of pregnancy with history of pre-term labor, unspecified trimester: Principal | ICD-10-CM

## 2017-04-28 DIAGNOSIS — O09899 Supervision of other high risk pregnancies, unspecified trimester: Secondary | ICD-10-CM

## 2017-04-28 NOTE — Progress Notes (Signed)
17p given 

## 2017-05-05 ENCOUNTER — Ambulatory Visit (INDEPENDENT_AMBULATORY_CARE_PROVIDER_SITE_OTHER): Payer: Medicaid Other | Admitting: *Deleted

## 2017-05-05 ENCOUNTER — Encounter: Payer: Self-pay | Admitting: General Practice

## 2017-05-05 DIAGNOSIS — O09212 Supervision of pregnancy with history of pre-term labor, second trimester: Secondary | ICD-10-CM

## 2017-05-05 DIAGNOSIS — O09219 Supervision of pregnancy with history of pre-term labor, unspecified trimester: Principal | ICD-10-CM

## 2017-05-05 DIAGNOSIS — O09899 Supervision of other high risk pregnancies, unspecified trimester: Secondary | ICD-10-CM

## 2017-05-05 NOTE — Progress Notes (Signed)
Pt presents for 17-p injection. Tolerated well.

## 2017-05-12 ENCOUNTER — Ambulatory Visit (INDEPENDENT_AMBULATORY_CARE_PROVIDER_SITE_OTHER): Payer: Medicaid Other | Admitting: General Practice

## 2017-05-12 DIAGNOSIS — O09212 Supervision of pregnancy with history of pre-term labor, second trimester: Secondary | ICD-10-CM

## 2017-05-12 DIAGNOSIS — O09219 Supervision of pregnancy with history of pre-term labor, unspecified trimester: Principal | ICD-10-CM

## 2017-05-12 DIAGNOSIS — O09899 Supervision of other high risk pregnancies, unspecified trimester: Secondary | ICD-10-CM

## 2017-05-12 NOTE — Progress Notes (Signed)
17p given.  Patient reports redness & itching at injection site for several days after injection- will try to get IM dose as patient has not had problems with this in the past.

## 2017-05-15 ENCOUNTER — Encounter (HOSPITAL_COMMUNITY): Payer: Self-pay

## 2017-05-15 ENCOUNTER — Inpatient Hospital Stay (HOSPITAL_COMMUNITY)
Admission: AD | Admit: 2017-05-15 | Discharge: 2017-05-15 | Disposition: A | Discharge status: Home / Self Care | Payer: Medicaid Other | Source: Ambulatory Visit | Attending: Obstetrics and Gynecology | Admitting: Obstetrics and Gynecology

## 2017-05-15 DIAGNOSIS — O368130 Decreased fetal movements, third trimester, not applicable or unspecified: Secondary | ICD-10-CM

## 2017-05-15 DIAGNOSIS — Z8249 Family history of ischemic heart disease and other diseases of the circulatory system: Secondary | ICD-10-CM | POA: Insufficient documentation

## 2017-05-15 DIAGNOSIS — J00 Acute nasopharyngitis [common cold]: Secondary | ICD-10-CM

## 2017-05-15 DIAGNOSIS — R8271 Bacteriuria: Secondary | ICD-10-CM

## 2017-05-15 DIAGNOSIS — O368120 Decreased fetal movements, second trimester, not applicable or unspecified: Secondary | ICD-10-CM | POA: Insufficient documentation

## 2017-05-15 DIAGNOSIS — Z3A26 26 weeks gestation of pregnancy: Secondary | ICD-10-CM | POA: Insufficient documentation

## 2017-05-15 DIAGNOSIS — O30042 Twin pregnancy, dichorionic/diamniotic, second trimester: Secondary | ICD-10-CM | POA: Insufficient documentation

## 2017-05-15 DIAGNOSIS — O99512 Diseases of the respiratory system complicating pregnancy, second trimester: Secondary | ICD-10-CM | POA: Diagnosis not present

## 2017-05-15 DIAGNOSIS — O30049 Twin pregnancy, dichorionic/diamniotic, unspecified trimester: Secondary | ICD-10-CM

## 2017-05-15 DIAGNOSIS — O26892 Other specified pregnancy related conditions, second trimester: Secondary | ICD-10-CM | POA: Diagnosis not present

## 2017-05-15 DIAGNOSIS — O368129 Decreased fetal movements, second trimester, other fetus: Secondary | ICD-10-CM

## 2017-05-15 LAB — URINALYSIS, ROUTINE W REFLEX MICROSCOPIC
BILIRUBIN URINE: NEGATIVE
Glucose, UA: NEGATIVE mg/dL
HGB URINE DIPSTICK: NEGATIVE
Ketones, ur: 5 mg/dL — AB
Leukocytes, UA: NEGATIVE
Nitrite: NEGATIVE
PROTEIN: NEGATIVE mg/dL
Specific Gravity, Urine: 1.017 (ref 1.005–1.030)
pH: 6 (ref 5.0–8.0)

## 2017-05-15 NOTE — MAU Provider Note (Signed)
History   [id number] @ 26.[facility] wks [facility]/o not feeling baby move for 3 days. And runny stopped up nose and sore throat for 2 days. Denies fever. Denies ROM or vag bleeding. Pt has not taken anything for her symptoms.  CSN: [id number]  Arrival date & time 05/15/17  [id number]   None     Chief Complaint  Patient presents with  . Decreased Fetal Movement  . Cough  . Sore Throat    HPI  Past Medical History:  Diagnosis Date  . History of preterm delivery, currently pregnant 02/28/2015   [ ] 17-P  . Umbilical hernia    repaired ~2004    Past Surgical History:  Procedure Laterality Date  . HERNIA REPAIR     gastic hernia 2005 and umbilical hernia 2004    Family History  Problem Relation Age of Onset  . Breast cancer Maternal Grandmother   . Hypertension Maternal Grandmother   . Cancer Maternal Grandmother        breast  . Hypertension Mother   . Heart disease Mother   . Hypertension Paternal Grandmother   . Diabetes Paternal Grandmother   . Diabetes Father   . Hearing loss Neg Hx     Social History   Tobacco Use  . Smoking status: Never Smoker  . Smokeless tobacco: Never Used  Substance Use Topics  . Alcohol use: No    Comment: ocassionally  . Drug use: No    OB History    Gravida Para Term Preterm AB Living   6 3 2 1 2 3    SAB TAB Ectopic Multiple Live Births     2   0 3      Review of Systems  Constitutional: Negative.   HENT: Positive for congestion, postnasal drip, rhinorrhea and sore throat.   Eyes: Negative.   Respiratory: Negative.   Cardiovascular: Negative.   Gastrointestinal: Negative.   Endocrine: Negative.   Genitourinary: Negative.   Musculoskeletal: Negative.   Skin: Negative.   Allergic/Immunologic: Negative.   Neurological: Negative.   Hematological: Negative.   Psychiatric/Behavioral: Negative.     Allergies  Patient has no known allergies.  Home Medications    BP 140/69 (BP Location: Right Arm)   Pulse 92    Temp 98.7 F (37.1 C) (Oral)   Resp 18   Ht 5\' 1"  (1.549 m)   Wt 238 lb (108 kg)   LMP 11/09/2016   SpO2 97%   BMI 44.97 kg/m   Physical Exam  Constitutional: She is oriented to person, place, and time. She appears well-developed and well-nourished.  HENT:  Head: Normocephalic.  Eyes: Pupils are equal, round, and reactive to light.  Neck: Normal range of motion.  Cardiovascular: Normal rate.  Pulmonary/Chest: Effort normal and breath sounds normal.  Abdominal: Soft. Bowel sounds are normal.  Musculoskeletal: Normal range of motion.  Neurological: She is alert and oriented to person, place, and time. She has normal reflexes.  Skin: Skin is warm and dry.  Psychiatric: She has a normal mood and affect. Her behavior is normal. Judgment and thought content normal.    MAU Course  Procedures (including critical care time)  Labs Reviewed  URINALYSIS, ROUTINE W REFLEX MICROSCOPIC - Abnormal; Notable for the following components:      Result Value   APPearance HAZY (*)    Ketones, ur 5 (*)    All other components within normal limits   No results found.   1. Common  cold   2. GBS bacteriuria   3. Dichorionic diamniotic twin pregnancy, antepartum   4. Decreased fetal movement affecting management of pregnancy in second trimester, other fetus       MDM  VSS, FHR's stable and pt now feeling movement. LCTAB, Heart RRR, copious amt nasal drainage clear in nature. Throat mild redness from nasal drainage. Instructed pt on OTC meds she can take for cold. Printed list also given. Discussed importance of kick counts and printed info given. FHR 140's and 150's respectively with no decels. Much movement noted after placing pt on monitor. Will d/c home

## 2017-05-15 NOTE — MAU Note (Signed)
Pt reports no fetal movement x 3 days, states she has been exposed to the flu and now she is sneezing, coughing, sore throat.

## 2017-05-15 NOTE — Discharge Instructions (Signed)
Upper Respiratory Infection, Adult °Most upper respiratory infections (URIs) are caused by a virus. A URI affects the nose, throat, and upper air passages. The most common type of URI is often called "the common cold." °Follow these instructions at home: °· Take medicines only as told by your doctor. °· Gargle warm saltwater or take cough drops to comfort your throat as told by your doctor. °· Use a warm mist humidifier or inhale steam from a shower to increase air moisture. This may make it easier to breathe. °· Drink enough fluid to keep your pee (urine) clear or pale yellow. °· Eat soups and other clear broths. °· Have a healthy diet. °· Rest as needed. °· Go back to work when your fever is gone or your doctor says it is okay. °? You may need to stay home longer to avoid giving your URI to others. °? You can also wear a face mask and wash your hands often to prevent spread of the virus. °· Use your inhaler more if you have asthma. °· Do not use any tobacco products, including cigarettes, chewing tobacco, or electronic cigarettes. If you need help quitting, ask your doctor. °Contact a doctor if: °· You are getting worse, not better. °· Your symptoms are not helped by medicine. °· You have chills. °· You are getting more short of breath. °· You have brown or red mucus. °· You have yellow or brown discharge from your nose. °· You have pain in your face, especially when you bend forward. °· You have a fever. °· You have puffy (swollen) neck glands. °· You have pain while swallowing. °· You have white areas in the back of your throat. °Get help right away if: °· You have very bad or constant: °? Headache. °? Ear pain. °? Pain in your forehead, behind your eyes, and over your cheekbones (sinus pain). °? Chest pain. °· You have long-lasting (chronic) lung disease and any of the following: °? Wheezing. °? Long-lasting cough. °? Coughing up blood. °? A change in your usual mucus. °· You have a stiff neck. °· You have  changes in your: °? Vision. °? Hearing. °? Thinking. °? Mood. °This information is not intended to replace advice given to you by your health care provider. Make sure you discuss any questions you have with your health care provider. °Document Released: 10/14/2007 Document Revised: 12/29/2015 Document Reviewed: 08/02/2013 °Elsevier Interactive Patient Education © 2018 [employer] °Fetal Movement Counts °Patient Name: ________________________________________________ Patient Due Date: ____________________ °What is a fetal movement count? °A fetal movement count is the number of times that you feel your baby move during a certain amount of time. This may also be called a fetal kick count. A fetal movement count is recommended for every pregnant woman. You may be asked to start counting fetal movements as early as week 28 of your pregnancy. °Pay attention to when your baby is most active. You may notice your baby's sleep and wake cycles. You may also notice things that make your baby move more. You should do a fetal movement count: °· When your baby is normally most active. °· At the same time each day. ° °A good time to count movements is while you are resting, after having something to eat and drink. °How do I count fetal movements? °1. Find a quiet, comfortable area. Sit, or lie down on your side. °2. Write down the date, the start time and stop time, and the number of movements that you felt   between those two times. Take this information with you to your health care visits. °3. For 2 hours, count kicks, flutters, swishes, rolls, and jabs. You should feel at least 10 movements during 2 hours. °4. You may stop counting after you have felt 10 movements. °5. If you do not feel 10 movements in 2 hours, have something to eat and drink. Then, keep resting and counting for 1 hour. If you feel at least 4 movements during that hour, you may stop counting. °Contact a health care provider if: °· You feel fewer than 4  movements in 2 hours. °· Your baby is not moving like he or she usually does. °Date: ____________ Start time: ____________ Stop time: ____________ Movements: ____________ °Date: ____________ Start time: ____________ Stop time: ____________ Movements: ____________ °Date: ____________ Start time: ____________ Stop time: ____________ Movements: ____________ °Date: ____________ Start time: ____________ Stop time: ____________ Movements: ____________ °Date: ____________ Start time: ____________ Stop time: ____________ Movements: ____________ °Date: ____________ Start time: ____________ Stop time: ____________ Movements: ____________ °Date: ____________ Start time: ____________ Stop time: ____________ Movements: ____________ °Date: ____________ Start time: ____________ Stop time: ____________ Movements: ____________ °Date: ____________ Start time: ____________ Stop time: ____________ Movements: ____________ °This information is not intended to replace advice given to you by your health care provider. Make sure you discuss any questions you have with your health care provider. °Document Released: 05/27/2006 Document Revised: 12/25/2015 Document Reviewed: 06/06/2015 °Elsevier Interactive Patient Education © 2018 [employer] ° °

## 2017-05-18 ENCOUNTER — Ambulatory Visit (HOSPITAL_COMMUNITY)
Admission: RE | Admit: 2017-05-18 | Discharge: 2017-05-18 | Disposition: A | Discharge status: Home / Self Care | Payer: Medicaid Other | Source: Ambulatory Visit | Attending: Advanced Practice Midwife | Admitting: Advanced Practice Midwife

## 2017-05-18 ENCOUNTER — Encounter (HOSPITAL_COMMUNITY): Payer: Self-pay

## 2017-05-18 DIAGNOSIS — O30042 Twin pregnancy, dichorionic/diamniotic, second trimester: Secondary | ICD-10-CM | POA: Diagnosis not present

## 2017-05-18 DIAGNOSIS — O30049 Twin pregnancy, dichorionic/diamniotic, unspecified trimester: Secondary | ICD-10-CM | POA: Diagnosis present

## 2017-05-18 DIAGNOSIS — Z3A27 27 weeks gestation of pregnancy: Secondary | ICD-10-CM | POA: Insufficient documentation

## 2017-05-18 DIAGNOSIS — Z362 Encounter for other antenatal screening follow-up: Secondary | ICD-10-CM | POA: Diagnosis not present

## 2017-05-18 DIAGNOSIS — O9989 Other specified diseases and conditions complicating pregnancy, childbirth and the puerperium: Secondary | ICD-10-CM | POA: Insufficient documentation

## 2017-05-18 DIAGNOSIS — O09219 Supervision of pregnancy with history of pre-term labor, unspecified trimester: Secondary | ICD-10-CM | POA: Diagnosis not present

## 2017-05-18 DIAGNOSIS — N1330 Unspecified hydronephrosis: Secondary | ICD-10-CM | POA: Insufficient documentation

## 2017-05-18 DIAGNOSIS — O99212 Obesity complicating pregnancy, second trimester: Secondary | ICD-10-CM | POA: Diagnosis not present

## 2017-05-18 DIAGNOSIS — R8271 Bacteriuria: Secondary | ICD-10-CM

## 2017-05-19 ENCOUNTER — Other Ambulatory Visit (HOSPITAL_COMMUNITY): Payer: Self-pay | Admitting: *Deleted

## 2017-05-19 ENCOUNTER — Other Ambulatory Visit (INDEPENDENT_AMBULATORY_CARE_PROVIDER_SITE_OTHER): Payer: Medicaid Other

## 2017-05-19 ENCOUNTER — Encounter: Payer: Medicaid Other | Admitting: Obstetrics and Gynecology

## 2017-05-19 VITALS — BP 130/68 | HR 93

## 2017-05-19 DIAGNOSIS — O30043 Twin pregnancy, dichorionic/diamniotic, third trimester: Secondary | ICD-10-CM

## 2017-05-19 DIAGNOSIS — O09899 Supervision of other high risk pregnancies, unspecified trimester: Secondary | ICD-10-CM

## 2017-05-19 DIAGNOSIS — O09212 Supervision of pregnancy with history of pre-term labor, second trimester: Secondary | ICD-10-CM | POA: Diagnosis not present

## 2017-05-19 DIAGNOSIS — O0990 Supervision of high risk pregnancy, unspecified, unspecified trimester: Secondary | ICD-10-CM

## 2017-05-19 DIAGNOSIS — O09219 Supervision of pregnancy with history of pre-term labor, unspecified trimester: Secondary | ICD-10-CM

## 2017-05-19 MED ORDER — HYDROXYPROGESTERONE CAPROATE 250 MG/ML IM OIL
250.0000 mg | TOPICAL_OIL | Freq: Once | INTRAMUSCULAR | Status: AC
  Administered 2017-05-19: 250 mg via INTRAMUSCULAR

## 2017-05-19 NOTE — Progress Notes (Unsigned)
17p given 

## 2017-05-20 ENCOUNTER — Encounter: Payer: Self-pay | Admitting: Obstetrics and Gynecology

## 2017-05-20 ENCOUNTER — Ambulatory Visit (INDEPENDENT_AMBULATORY_CARE_PROVIDER_SITE_OTHER): Payer: Medicaid Other | Admitting: Obstetrics and Gynecology

## 2017-05-20 VITALS — BP 122/62 | HR 94 | Wt 236.0 lb

## 2017-05-20 DIAGNOSIS — O09899 Supervision of other high risk pregnancies, unspecified trimester: Secondary | ICD-10-CM

## 2017-05-20 DIAGNOSIS — O0990 Supervision of high risk pregnancy, unspecified, unspecified trimester: Secondary | ICD-10-CM

## 2017-05-20 DIAGNOSIS — O0992 Supervision of high risk pregnancy, unspecified, second trimester: Secondary | ICD-10-CM | POA: Diagnosis not present

## 2017-05-20 DIAGNOSIS — O09212 Supervision of pregnancy with history of pre-term labor, second trimester: Secondary | ICD-10-CM

## 2017-05-20 DIAGNOSIS — R8271 Bacteriuria: Secondary | ICD-10-CM | POA: Diagnosis not present

## 2017-05-20 DIAGNOSIS — O30049 Twin pregnancy, dichorionic/diamniotic, unspecified trimester: Secondary | ICD-10-CM

## 2017-05-20 DIAGNOSIS — Z23 Encounter for immunization: Secondary | ICD-10-CM | POA: Diagnosis not present

## 2017-05-20 DIAGNOSIS — O30042 Twin pregnancy, dichorionic/diamniotic, second trimester: Secondary | ICD-10-CM | POA: Diagnosis not present

## 2017-05-20 DIAGNOSIS — O09219 Supervision of pregnancy with history of pre-term labor, unspecified trimester: Secondary | ICD-10-CM

## 2017-05-20 LAB — CBC
Hematocrit: 31.8 % — ABNORMAL LOW (ref 34.0–46.6)
Hemoglobin: 10.7 g/dL — ABNORMAL LOW (ref 11.1–15.9)
MCH: 28.5 pg (ref 26.6–33.0)
MCHC: 33.6 g/dL (ref 31.5–35.7)
MCV: 85 fL (ref 79–97)
PLATELETS: 174 10*3/uL (ref 150–379)
RBC: 3.75 x10E6/uL — AB (ref 3.77–5.28)
RDW: 15.4 % (ref 12.3–15.4)
WBC: 9.6 10*3/uL (ref 3.4–10.8)

## 2017-05-20 LAB — RPR: RPR: NONREACTIVE

## 2017-05-20 LAB — GLUCOSE TOLERANCE, 2 HOURS W/ 1HR
GLUCOSE, 1 HOUR: 136 mg/dL (ref 65–179)
GLUCOSE, FASTING: 91 mg/dL (ref 65–91)
Glucose, 2 hour: 107 mg/dL (ref 65–152)

## 2017-05-20 LAB — HIV ANTIBODY (ROUTINE TESTING W REFLEX): HIV Screen 4th Generation wRfx: NONREACTIVE

## 2017-05-20 MED ORDER — FUSION PLUS PO CAPS: 1.0000 | ORAL_CAPSULE | Freq: Every day | ORAL | 3 refills | Status: DC

## 2017-05-20 NOTE — Progress Notes (Signed)
BTL papers signed.  

## 2017-05-20 NOTE — Progress Notes (Signed)
Prenatal Visit Note Date: 05/20/2017 Clinic: [facility]  Subjective:  Joice Loftsikita Cotten is a 32 y.o. [id number] at 10383w3d being seen today for ongoing prenatal care.  She is currently monitored for the following issues for this high-risk pregnancy and has Supervision of high risk pregnancy, antepartum; History of preterm delivery, currently pregnant; Dichorionic diamniotic twin pregnancy, antepartum; and GBS bacteriuria on their problem list.  Patient reports no complaints.   Contractions: Not present. Vag. Bleeding: None.  Movement: Present. Denies leaking of fluid.   The following portions of the patient's history were reviewed and updated as appropriate: allergies, current medications, past family history, past medical history, past social history, past surgical history and problem list. Problem list updated.  Objective:   Vitals:   05/20/17 1507  BP: 122/62  Pulse: 94  Weight: 236 lb (107 kg)    Fetal Status: Fetal Heart Rate (bpm): 152/160   Movement: Present     General:  Alert, oriented and cooperative. Patient is in no acute distress.  Skin: Skin is warm and dry. No rash noted.   Cardiovascular: Normal heart rate noted  Respiratory: Normal respiratory effort, no problems with respiration noted  Abdomen: Soft, gravid, appropriate for gestational age. Pain/Pressure: Present     Pelvic:  Cervical exam deferred        Extremities: Normal range of motion.  Edema: None  Mental Status: Normal mood and affect. Normal behavior. Normal judgment and thought content.   Urinalysis:      Assessment and Plan:  Pregnancy: [id number] at 2283w3d  1. Supervision of high risk pregnancy, antepartum Routine care. BTL papers signed today. Fusion plus for extra iron sent in. Normal GTT last visit.  - Tdap vaccine greater than or equal to 7yo IM - Culture, OB Urine  2. History of preterm delivery, currently pregnant 17p today. Continue qwk  3. Dichorionic diamniotic twin pregnancy,  antepartum Normal growth on 1/6. D/w her that as long as first is cephalic can try for VD. Will d/w pt more later in pregnancy  4. GBS bacteriuria toc today - Culture, OB Urine  Preterm labor symptoms and general obstetric precautions including but not limited to vaginal bleeding, contractions, leaking of fluid and fetal movement were reviewed in detail with the patient. Please refer to After Visit Summary for other counseling recommendations.  Return in about 1 week (around 05/27/2017) for 17p and 2wk 17p and ob visit.   Yonah BingPickens, Jarris Kortz, MD

## 2017-05-21 ENCOUNTER — Encounter: Payer: Self-pay | Admitting: *Deleted

## 2017-05-23 LAB — CULTURE, OB URINE

## 2017-05-23 LAB — URINE CULTURE, OB REFLEX

## 2017-05-27 ENCOUNTER — Ambulatory Visit (INDEPENDENT_AMBULATORY_CARE_PROVIDER_SITE_OTHER): Payer: Medicaid Other | Admitting: *Deleted

## 2017-05-27 VITALS — BP 118/59 | HR 96

## 2017-05-27 DIAGNOSIS — O09899 Supervision of other high risk pregnancies, unspecified trimester: Secondary | ICD-10-CM

## 2017-05-27 DIAGNOSIS — O30049 Twin pregnancy, dichorionic/diamniotic, unspecified trimester: Secondary | ICD-10-CM

## 2017-05-27 DIAGNOSIS — O0990 Supervision of high risk pregnancy, unspecified, unspecified trimester: Secondary | ICD-10-CM

## 2017-05-27 DIAGNOSIS — O09213 Supervision of pregnancy with history of pre-term labor, third trimester: Secondary | ICD-10-CM

## 2017-05-27 DIAGNOSIS — O09219 Supervision of pregnancy with history of pre-term labor, unspecified trimester: Secondary | ICD-10-CM

## 2017-05-27 MED ORDER — HYDROXYPROGESTERONE CAPROATE 250 MG/ML IM OIL
250.0000 mg | TOPICAL_OIL | INTRAMUSCULAR | Status: DC
  Administered 2017-05-27 – 2017-07-08 (×6): 250 mg via INTRAMUSCULAR

## 2017-06-03 ENCOUNTER — Ambulatory Visit: Payer: Medicaid Other

## 2017-06-04 ENCOUNTER — Ambulatory Visit (INDEPENDENT_AMBULATORY_CARE_PROVIDER_SITE_OTHER): Payer: Medicaid Other

## 2017-06-04 VITALS — BP 127/72 | HR 89

## 2017-06-04 DIAGNOSIS — O09899 Supervision of other high risk pregnancies, unspecified trimester: Secondary | ICD-10-CM

## 2017-06-04 DIAGNOSIS — O09219 Supervision of pregnancy with history of pre-term labor, unspecified trimester: Principal | ICD-10-CM

## 2017-06-04 DIAGNOSIS — O09213 Supervision of pregnancy with history of pre-term labor, third trimester: Secondary | ICD-10-CM | POA: Diagnosis not present

## 2017-06-04 NOTE — Progress Notes (Signed)
Pt presented to the office for a [name] injection. Pt tolerated well.

## 2017-06-04 NOTE — Progress Notes (Signed)
Chart reviewed for nurse visit. Agree with plan of care.   Pincus Largehelps, Naydeline Morace Y, DO 06/04/2017 3:43 PM

## 2017-06-09 ENCOUNTER — Encounter: Payer: Self-pay | Admitting: Obstetrics and Gynecology

## 2017-06-09 ENCOUNTER — Ambulatory Visit (INDEPENDENT_AMBULATORY_CARE_PROVIDER_SITE_OTHER): Payer: Medicaid Other | Admitting: Obstetrics and Gynecology

## 2017-06-09 VITALS — BP 118/76 | HR 91 | Wt 240.7 lb

## 2017-06-09 DIAGNOSIS — O0993 Supervision of high risk pregnancy, unspecified, third trimester: Secondary | ICD-10-CM | POA: Diagnosis not present

## 2017-06-09 DIAGNOSIS — O30043 Twin pregnancy, dichorionic/diamniotic, third trimester: Secondary | ICD-10-CM | POA: Diagnosis present

## 2017-06-09 DIAGNOSIS — O0990 Supervision of high risk pregnancy, unspecified, unspecified trimester: Secondary | ICD-10-CM

## 2017-06-09 DIAGNOSIS — O09213 Supervision of pregnancy with history of pre-term labor, third trimester: Secondary | ICD-10-CM

## 2017-06-09 DIAGNOSIS — O09219 Supervision of pregnancy with history of pre-term labor, unspecified trimester: Secondary | ICD-10-CM

## 2017-06-09 DIAGNOSIS — O09899 Supervision of other high risk pregnancies, unspecified trimester: Secondary | ICD-10-CM

## 2017-06-09 DIAGNOSIS — R8271 Bacteriuria: Secondary | ICD-10-CM

## 2017-06-09 DIAGNOSIS — Z3009 Encounter for other general counseling and advice on contraception: Secondary | ICD-10-CM

## 2017-06-09 DIAGNOSIS — O30049 Twin pregnancy, dichorionic/diamniotic, unspecified trimester: Secondary | ICD-10-CM

## 2017-06-09 NOTE — Progress Notes (Signed)
Subjective:  Krystal Chan is a 32 y.o. [id number] at 495w2d being seen today for ongoing prenatal care.  She is currently monitored for the following issues for this high-risk pregnancy and has Supervision of high risk pregnancy, antepartum; History of preterm delivery, currently pregnant; Dichorionic diamniotic twin pregnancy, antepartum; GBS bacteriuria; and Unwanted fertility on their problem list.  Patient reports no complaints.  Contractions: Not present. Vag. Bleeding: None.  Movement: Present. Denies leaking of fluid.   The following portions of the patient's history were reviewed and updated as appropriate: allergies, current medications, past family history, past medical history, past social history, past surgical history and problem list. Problem list updated.  Objective:   Vitals:   06/09/17 1321  BP: 118/76  Pulse: 91  Weight: 240 lb 11.2 oz (109.2 kg)    Fetal Status: Fetal Heart Rate (bpm): 150/142   Movement: Present     General:  Alert, oriented and cooperative. Patient is in no acute distress.  Skin: Skin is warm and dry. No rash noted.   Cardiovascular: Normal heart rate noted  Respiratory: Normal respiratory effort, no problems with respiration noted  Abdomen: Soft, gravid, appropriate for gestational age. Pain/Pressure: Present     Pelvic:  Cervical exam deferred        Extremities: Normal range of motion.  Edema: Trace  Mental Status: Normal mood and affect. Normal behavior. Normal judgment and thought content.   Urinalysis:      Assessment and Plan:  Pregnancy: [id number] at 605w2d  1. Dichorionic diamniotic twin pregnancy, antepartum Stable Growth scan next week  2. GBS bacteriuria Tx while in labor  3. History of preterm delivery, currently pregnant No evidence of PTL, continue with weekly 17 OHP  4. Supervision of high risk pregnancy, antepartum Stable  5. Unwanted fertility BTL papers signed  Preterm labor symptoms and general obstetric precautions  including but not limited to vaginal bleeding, contractions, leaking of fluid and fetal movement were reviewed in detail with the patient. Please refer to After Visit Summary for other counseling recommendations.  Return in about 2 weeks (around 06/23/2017) for OB visit.   Hermina StaggersErvin, Michael L, MD

## 2017-06-09 NOTE — Progress Notes (Signed)
17p given 

## 2017-06-15 ENCOUNTER — Encounter (HOSPITAL_COMMUNITY): Payer: Self-pay

## 2017-06-15 ENCOUNTER — Ambulatory Visit (HOSPITAL_COMMUNITY)
Admission: RE | Admit: 2017-06-15 | Discharge: 2017-06-15 | Disposition: A | Discharge status: Home / Self Care | Payer: Medicaid Other | Source: Ambulatory Visit | Attending: Obstetrics and Gynecology | Admitting: Obstetrics and Gynecology

## 2017-06-15 DIAGNOSIS — R8271 Bacteriuria: Secondary | ICD-10-CM

## 2017-06-15 DIAGNOSIS — O99213 Obesity complicating pregnancy, third trimester: Secondary | ICD-10-CM | POA: Diagnosis not present

## 2017-06-15 DIAGNOSIS — O30049 Twin pregnancy, dichorionic/diamniotic, unspecified trimester: Secondary | ICD-10-CM

## 2017-06-15 DIAGNOSIS — Z3A31 31 weeks gestation of pregnancy: Secondary | ICD-10-CM | POA: Diagnosis not present

## 2017-06-15 DIAGNOSIS — O30043 Twin pregnancy, dichorionic/diamniotic, third trimester: Secondary | ICD-10-CM | POA: Insufficient documentation

## 2017-06-15 DIAGNOSIS — Z362 Encounter for other antenatal screening follow-up: Secondary | ICD-10-CM | POA: Insufficient documentation

## 2017-06-16 ENCOUNTER — Ambulatory Visit (INDEPENDENT_AMBULATORY_CARE_PROVIDER_SITE_OTHER): Payer: Medicaid Other | Admitting: *Deleted

## 2017-06-16 DIAGNOSIS — O09213 Supervision of pregnancy with history of pre-term labor, third trimester: Secondary | ICD-10-CM

## 2017-06-16 DIAGNOSIS — O0990 Supervision of high risk pregnancy, unspecified, unspecified trimester: Secondary | ICD-10-CM

## 2017-06-16 MED ORDER — HYDROXYPROGESTERONE CAPROATE 250 MG/ML IM OIL
250.0000 mg | TOPICAL_OIL | Freq: Once | INTRAMUSCULAR | Status: AC
  Administered 2017-06-16: 250 mg via INTRAMUSCULAR

## 2017-06-16 NOTE — Progress Notes (Signed)
Pt came in for makena injection IM and will f/u next week 06/23/17 injection [facility]([name])

## 2017-06-21 ENCOUNTER — Encounter (HOSPITAL_COMMUNITY): Payer: Self-pay

## 2017-06-21 ENCOUNTER — Inpatient Hospital Stay (HOSPITAL_COMMUNITY)
Admission: AD | Admit: 2017-06-21 | Discharge: 2017-06-21 | Disposition: A | Discharge status: Home / Self Care | Payer: Medicaid Other | Source: Ambulatory Visit | Attending: Obstetrics & Gynecology | Admitting: Obstetrics & Gynecology

## 2017-06-21 DIAGNOSIS — Z3A32 32 weeks gestation of pregnancy: Secondary | ICD-10-CM | POA: Insufficient documentation

## 2017-06-21 DIAGNOSIS — M549 Dorsalgia, unspecified: Secondary | ICD-10-CM

## 2017-06-21 DIAGNOSIS — O9989 Other specified diseases and conditions complicating pregnancy, childbirth and the puerperium: Secondary | ICD-10-CM

## 2017-06-21 DIAGNOSIS — O4703 False labor before 37 completed weeks of gestation, third trimester: Secondary | ICD-10-CM | POA: Diagnosis present

## 2017-06-21 DIAGNOSIS — O99891 Other specified diseases and conditions complicating pregnancy: Secondary | ICD-10-CM

## 2017-06-21 NOTE — MAU Provider Note (Signed)
History     CSN: [id number]  Arrival date and time: 06/21/17 2158   First Provider Initiated Contact with Patient 06/21/17 2242      Chief Complaint  Patient presents with  . Contractions   HPI  Ms.  Krystal Chan is a 32 y.o. year old [phone] female at [redacted]w[redacted]d weeks gestation who presents to MAU reporting occasional UC's throughout the day, lower back pain that radiates to lower abdomen. She got a Rx for maternity belt, but "does not have the money to pay for it." She recieves weekly 17-P injections; next injection due 06/23/2017. She denies VB or LOF. She reports good (+) FM x 2 today.  Past Medical History:  Diagnosis Date  . History of preterm delivery, currently pregnant 02/28/2015   [ ] 17-P  . Umbilical hernia    repaired ~2004    Past Surgical History:  Procedure Laterality Date  . HERNIA REPAIR     gastic hernia 2005 and umbilical hernia 2004    Family History  Problem Relation Age of Onset  . Breast cancer Maternal Grandmother   . Hypertension Maternal Grandmother   . Cancer Maternal Grandmother        breast  . Hypertension Mother   . Heart disease Mother   . Hypertension Paternal Grandmother   . Diabetes Paternal Grandmother   . Diabetes Father   . Hearing loss Neg Hx     Social History   Tobacco Use  . Smoking status: Never Smoker  . Smokeless tobacco: Never Used  Substance Use Topics  . Alcohol use: No    Comment: ocassionally  . Drug use: No    Allergies: No Known Allergies  Facility-Administered Medications Prior to Admission  Medication Dose Route Frequency Provider Last Rate Last Dose  . hydroxyprogesterone caproate ([name]) 250 mg/mL injection 250 mg  250 mg Intramuscular Weekly [place] Bing, MD   250 mg at 06/09/17 1332   Medications Prior to Admission  Medication Sig Dispense Refill Last Dose  . Prenat w/o A Vit-FeFum-FePo-FA (CONCEPT OB) 130-92.4-1 MG CAPS Take 1 tablet by mouth daily. 30 capsule 12 06/21/2017 at Unknown time  .  Iron-FA-B Cmp-C-Biot-Probiotic (FUSION PLUS) CAPS Take 1 tablet by mouth daily. 60 capsule 3 Taking    Review of Systems  Constitutional: Negative.   HENT: Negative.   Eyes: Negative.   Respiratory: Negative.   Cardiovascular: Negative.   Gastrointestinal: Negative.   Endocrine: Negative.   Genitourinary: Positive for pelvic pain ("occ UC's throughout the day").  Musculoskeletal: Positive for back pain.  Skin: Negative.   Allergic/Immunologic: Negative.   Neurological: Negative.   Hematological: Negative.   Psychiatric/Behavioral: Negative.    Physical Exam   Blood pressure 134/79, pulse (!) 101, temperature 98.2 F (36.8 C), temperature source Oral, resp. rate 18, height 5\' 1"  (1.549 m), weight 246 lb (111.6 kg), last menstrual period 11/09/2016, SpO2 97 %.  Physical Exam  Nursing note and vitals reviewed. Constitutional: She is oriented to person, place, and time. She appears well-developed and well-nourished.  HENT:  Head: Normocephalic and atraumatic.  Eyes: Pupils are equal, round, and reactive to light.  Neck: Normal range of motion.  Cardiovascular: Normal rate, regular rhythm and normal heart sounds.  Respiratory: Effort normal and breath sounds normal.  GI: Soft. Bowel sounds are normal.  Genitourinary:  Genitourinary Comments: Dilation: Closed Effacement (%): Thick Cervical Position: Posterior Station: Ballotable Presentation: Undeterminable Exam by: Carloyn Jaeger, CNM  Musculoskeletal: Normal range of motion.  Neurological: She is alert and  oriented to person, place, and time.  Skin: Skin is warm and dry.  Psychiatric: She has a normal mood and affect. Her behavior is normal. Judgment and thought content normal.    MAU Course  Procedures  MDM NST - FHR (baby A): 145 bpm / moderate variability / accels present / decels absent // FHR (baby B): 140 bpm / moderate variability / accels present / decels absent / TOCO: 1-2 UC's with UI   Assessment and Plan   Back pain affecting pregnancy in third trimester - Advised that maternity belt would be very helpful for the back pain and pelvic pressure she's having - Advised to check on [employer], [employer], [employer], and [employer] for the best price on a maternity belt   Preterm uterine contractions in third trimester, antepartum - Continue weekly 17-P injections  Discharge home Patient verbalized an understanding of the plan of care and agrees.    Krystal Moraolitta Alayha Babineaux, MSN, CNM 06/21/2017, 10:42 PM

## 2017-06-21 NOTE — MAU Note (Addendum)
Pt reports contractions all throughout the day but have just gotten worse as the day has progressed. Pt denies vaginal bleeding. Reports some mucousy discharge. Reports good fetal movement. Pt reports a lot of pelvic pressure as well.

## 2017-06-23 ENCOUNTER — Encounter: Payer: Self-pay | Admitting: Obstetrics and Gynecology

## 2017-06-23 ENCOUNTER — Ambulatory Visit (INDEPENDENT_AMBULATORY_CARE_PROVIDER_SITE_OTHER): Payer: Medicaid Other | Admitting: Obstetrics and Gynecology

## 2017-06-23 VITALS — BP 126/74 | HR 98 | Wt 243.9 lb

## 2017-06-23 DIAGNOSIS — O30043 Twin pregnancy, dichorionic/diamniotic, third trimester: Secondary | ICD-10-CM

## 2017-06-23 DIAGNOSIS — O30049 Twin pregnancy, dichorionic/diamniotic, unspecified trimester: Secondary | ICD-10-CM

## 2017-06-23 DIAGNOSIS — O09899 Supervision of other high risk pregnancies, unspecified trimester: Secondary | ICD-10-CM

## 2017-06-23 DIAGNOSIS — O09213 Supervision of pregnancy with history of pre-term labor, third trimester: Secondary | ICD-10-CM

## 2017-06-23 DIAGNOSIS — O0990 Supervision of high risk pregnancy, unspecified, unspecified trimester: Secondary | ICD-10-CM

## 2017-06-23 DIAGNOSIS — O09219 Supervision of pregnancy with history of pre-term labor, unspecified trimester: Secondary | ICD-10-CM

## 2017-06-23 DIAGNOSIS — O0993 Supervision of high risk pregnancy, unspecified, third trimester: Secondary | ICD-10-CM

## 2017-06-23 DIAGNOSIS — Z3009 Encounter for other general counseling and advice on contraception: Secondary | ICD-10-CM

## 2017-06-23 DIAGNOSIS — R8271 Bacteriuria: Secondary | ICD-10-CM

## 2017-06-23 NOTE — Progress Notes (Signed)
Subjective:  Krystal Chan is a 32 y.o. [id number] at 7062w2d being seen today for ongoing prenatal care.  She is currently monitored for the following issues for this high-risk pregnancy and has Supervision of high risk pregnancy, antepartum; History of preterm delivery, currently pregnant; Dichorionic diamniotic twin pregnancy, antepartum; GBS bacteriuria; Unwanted fertility; Back pain complicating pregnancy; and Preterm uterine contractions in third trimester, antepartum on their problem list.  Patient reports no complaints.  Contractions: Irritability. Vag. Bleeding: None.  Movement: Present. Denies leaking of fluid.   The following portions of the patient's history were reviewed and updated as appropriate: allergies, current medications, past family history, past medical history, past social history, past surgical history and problem list. Problem list updated.  Objective:   Vitals:   06/23/17 1507  BP: 126/74  Pulse: 98  Weight: 243 lb 14.4 oz (110.6 kg)    Fetal Status: Fetal Heart Rate (bpm): 142/148   Movement: Present     General:  Alert, oriented and cooperative. Patient is in no acute distress.  Skin: Skin is warm and dry. No rash noted.   Cardiovascular: Normal heart rate noted  Respiratory: Normal respiratory effort, no problems with respiration noted  Abdomen: Soft, gravid, appropriate for gestational age. Pain/Pressure: Present     Pelvic:  Cervical exam deferred        Extremities: Normal range of motion.  Edema: Trace  Mental Status: Normal mood and affect. Normal behavior. Normal judgment and thought content.   Urinalysis:      Assessment and Plan:  Pregnancy: [id number] at 3262w2d  1. Dichorionic diamniotic twin pregnancy, antepartum Growth scan 06/15/17 Br/Vt 72/81% growth, repeat scheduled for 07/13/17 - US MFM FETAL BPP WO NON STRESS; Future - US MFM FETAL BPP WO NST ADDL GESTATION; Future  2. Unwanted fertility Papers signed  3. Supervision of high risk pregnancy,  antepartum Stable  4. History of preterm delivery, currently pregnant Continue with 17 OHP  5. GBS bacteriuria Tx while in labor  Preterm labor symptoms and general obstetric precautions including but not limited to vaginal bleeding, contractions, leaking of fluid and fetal movement were reviewed in detail with the patient. Please refer to After Visit Summary for other counseling recommendations.  Return in about 1 week (around 06/30/2017) for OB visit.   Hermina StaggersErvin, Michael L, MD

## 2017-06-24 ENCOUNTER — Ambulatory Visit (INDEPENDENT_AMBULATORY_CARE_PROVIDER_SITE_OTHER): Payer: Medicaid Other | Admitting: *Deleted

## 2017-06-24 VITALS — BP 138/73 | HR 92

## 2017-06-24 DIAGNOSIS — O09893 Supervision of other high risk pregnancies, third trimester: Secondary | ICD-10-CM

## 2017-06-24 DIAGNOSIS — O30049 Twin pregnancy, dichorionic/diamniotic, unspecified trimester: Secondary | ICD-10-CM | POA: Diagnosis not present

## 2017-06-24 DIAGNOSIS — O09219 Supervision of pregnancy with history of pre-term labor, unspecified trimester: Secondary | ICD-10-CM | POA: Diagnosis not present

## 2017-06-24 DIAGNOSIS — O0990 Supervision of high risk pregnancy, unspecified, unspecified trimester: Secondary | ICD-10-CM | POA: Diagnosis not present

## 2017-06-24 DIAGNOSIS — O09213 Supervision of pregnancy with history of pre-term labor, third trimester: Secondary | ICD-10-CM | POA: Diagnosis present

## 2017-06-27 NOTE — Progress Notes (Signed)
Patient seen and assessed by nursing staff.  Agree with documentation and plan.  

## 2017-06-28 ENCOUNTER — Encounter (HOSPITAL_COMMUNITY): Payer: Self-pay

## 2017-06-28 ENCOUNTER — Ambulatory Visit (HOSPITAL_COMMUNITY)
Admission: RE | Admit: 2017-06-28 | Discharge: 2017-06-28 | Disposition: A | Discharge status: Home / Self Care | Payer: Medicaid Other | Source: Ambulatory Visit | Attending: Obstetrics and Gynecology | Admitting: Obstetrics and Gynecology

## 2017-06-28 ENCOUNTER — Other Ambulatory Visit: Payer: Self-pay | Admitting: Obstetrics and Gynecology

## 2017-06-28 ENCOUNTER — Other Ambulatory Visit (HOSPITAL_COMMUNITY): Payer: Self-pay | Admitting: *Deleted

## 2017-06-28 DIAGNOSIS — Z3A33 33 weeks gestation of pregnancy: Secondary | ICD-10-CM

## 2017-06-28 DIAGNOSIS — O99213 Obesity complicating pregnancy, third trimester: Secondary | ICD-10-CM

## 2017-06-28 DIAGNOSIS — O30049 Twin pregnancy, dichorionic/diamniotic, unspecified trimester: Secondary | ICD-10-CM

## 2017-06-28 DIAGNOSIS — O09299 Supervision of pregnancy with other poor reproductive or obstetric history, unspecified trimester: Secondary | ICD-10-CM

## 2017-06-28 DIAGNOSIS — O30043 Twin pregnancy, dichorionic/diamniotic, third trimester: Secondary | ICD-10-CM

## 2017-06-28 NOTE — Addendum Note (Signed)
Encounter addended by: Vivien RotaSmall, Ahnesty Finfrock H, RT on: 06/28/2017 9:17 AM  Actions taken: Imaging Exam ended

## 2017-07-01 ENCOUNTER — Ambulatory Visit (INDEPENDENT_AMBULATORY_CARE_PROVIDER_SITE_OTHER): Payer: Medicaid Other | Admitting: *Deleted

## 2017-07-01 VITALS — BP 123/72 | HR 92

## 2017-07-01 DIAGNOSIS — O09893 Supervision of other high risk pregnancies, third trimester: Secondary | ICD-10-CM

## 2017-07-01 DIAGNOSIS — O09213 Supervision of pregnancy with history of pre-term labor, third trimester: Secondary | ICD-10-CM

## 2017-07-06 ENCOUNTER — Other Ambulatory Visit (HOSPITAL_COMMUNITY): Payer: Self-pay | Admitting: Obstetrics and Gynecology

## 2017-07-06 ENCOUNTER — Encounter (HOSPITAL_COMMUNITY): Payer: Self-pay

## 2017-07-06 ENCOUNTER — Ambulatory Visit (HOSPITAL_COMMUNITY)
Admission: RE | Admit: 2017-07-06 | Discharge: 2017-07-06 | Disposition: A | Discharge status: Home / Self Care | Payer: Medicaid Other | Source: Ambulatory Visit | Attending: Obstetrics and Gynecology | Admitting: Obstetrics and Gynecology

## 2017-07-06 DIAGNOSIS — O99213 Obesity complicating pregnancy, third trimester: Secondary | ICD-10-CM

## 2017-07-06 DIAGNOSIS — M549 Dorsalgia, unspecified: Secondary | ICD-10-CM

## 2017-07-06 DIAGNOSIS — O09213 Supervision of pregnancy with history of pre-term labor, third trimester: Secondary | ICD-10-CM | POA: Insufficient documentation

## 2017-07-06 DIAGNOSIS — Z3A34 34 weeks gestation of pregnancy: Secondary | ICD-10-CM | POA: Diagnosis not present

## 2017-07-06 DIAGNOSIS — O4703 False labor before 37 completed weeks of gestation, third trimester: Secondary | ICD-10-CM

## 2017-07-06 DIAGNOSIS — O30043 Twin pregnancy, dichorionic/diamniotic, third trimester: Secondary | ICD-10-CM

## 2017-07-06 DIAGNOSIS — O09299 Supervision of pregnancy with other poor reproductive or obstetric history, unspecified trimester: Secondary | ICD-10-CM

## 2017-07-06 DIAGNOSIS — O99891 Other specified diseases and conditions complicating pregnancy: Secondary | ICD-10-CM

## 2017-07-06 DIAGNOSIS — R8271 Bacteriuria: Secondary | ICD-10-CM

## 2017-07-06 DIAGNOSIS — O9989 Other specified diseases and conditions complicating pregnancy, childbirth and the puerperium: Secondary | ICD-10-CM

## 2017-07-06 DIAGNOSIS — O30049 Twin pregnancy, dichorionic/diamniotic, unspecified trimester: Secondary | ICD-10-CM

## 2017-07-08 ENCOUNTER — Ambulatory Visit (INDEPENDENT_AMBULATORY_CARE_PROVIDER_SITE_OTHER): Payer: Medicaid Other | Admitting: Obstetrics and Gynecology

## 2017-07-08 ENCOUNTER — Encounter: Payer: Self-pay | Admitting: Obstetrics and Gynecology

## 2017-07-08 VITALS — BP 138/79 | HR 91 | Wt 249.0 lb

## 2017-07-08 DIAGNOSIS — O0990 Supervision of high risk pregnancy, unspecified, unspecified trimester: Secondary | ICD-10-CM

## 2017-07-08 DIAGNOSIS — Z3009 Encounter for other general counseling and advice on contraception: Secondary | ICD-10-CM

## 2017-07-08 DIAGNOSIS — O30043 Twin pregnancy, dichorionic/diamniotic, third trimester: Secondary | ICD-10-CM | POA: Diagnosis not present

## 2017-07-08 DIAGNOSIS — O09213 Supervision of pregnancy with history of pre-term labor, third trimester: Secondary | ICD-10-CM | POA: Diagnosis not present

## 2017-07-08 DIAGNOSIS — O30049 Twin pregnancy, dichorionic/diamniotic, unspecified trimester: Secondary | ICD-10-CM

## 2017-07-08 DIAGNOSIS — O09899 Supervision of other high risk pregnancies, unspecified trimester: Secondary | ICD-10-CM

## 2017-07-08 DIAGNOSIS — O0993 Supervision of high risk pregnancy, unspecified, third trimester: Secondary | ICD-10-CM

## 2017-07-08 DIAGNOSIS — O09219 Supervision of pregnancy with history of pre-term labor, unspecified trimester: Secondary | ICD-10-CM

## 2017-07-08 NOTE — Progress Notes (Signed)
Subjective:  Krystal Chan is a 32 y.o. [id number] at 3386w3d being seen today for ongoing prenatal care.  She is currently monitored for the following issues for this high-risk pregnancy and has Supervision of high risk pregnancy, antepartum; History of preterm delivery, currently pregnant; Dichorionic diamniotic twin pregnancy, antepartum; GBS bacteriuria; Unwanted fertility; Back pain complicating pregnancy; and Preterm uterine contractions in third trimester, antepartum on their problem list.  Patient reports occasional contractions.  Contractions: Irritability. Vag. Bleeding: None.  Movement: Present. Denies leaking of fluid.   The following portions of the patient's history were reviewed and updated as appropriate: allergies, current medications, past family history, past medical history, past social history, past surgical history and problem list. Problem list updated.  Objective:   Vitals:   07/08/17 1015  BP: 138/79  Pulse: 91  Weight: 249 lb (112.9 kg)    Fetal Status: Fetal Heart Rate (bpm): 146/148   Movement: Present     General:  Alert, oriented and cooperative. Patient is in no acute distress.  Skin: Skin is warm and dry. No rash noted.   Cardiovascular: Normal heart rate noted  Respiratory: Normal respiratory effort, no problems with respiration noted  Abdomen: Soft, gravid, appropriate for gestational age. Pain/Pressure: Present     Pelvic:  Cervical exam performed        Extremities: Normal range of motion.  Edema: Trace  Mental Status: Normal mood and affect. Normal behavior. Normal judgment and thought content.   Urinalysis:      Assessment and Plan:  Pregnancy: [id number] at 1986w3d  1. Supervision of high risk pregnancy, antepartum Stable  2. History of preterm delivery, currently pregnant Stable on 17 OHP  3. Dichorionic diamniotic twin pregnancy, antepartum Stable Vtx/Br Antenatal testing weekly with MFM Growth scan next week  4. Unwanted fertility BTL papers  signed  Preterm labor symptoms and general obstetric precautions including but not limited to vaginal bleeding, contractions, leaking of fluid and fetal movement were reviewed in detail with the patient. Please refer to After Visit Summary for other counseling recommendations.  Return in about 1 week (around 07/15/2017) for OB visit.   Hermina StaggersErvin, Bedford Winsor L, MD

## 2017-07-09 NOTE — Progress Notes (Signed)
I have reviewed the chart and agree with nursing staff's documentation of this patient's encounter.  Elsie LincolnKelly Niguel Moure, MD 07/09/2017 6:46 AM

## 2017-07-13 ENCOUNTER — Ambulatory Visit (HOSPITAL_COMMUNITY)
Admission: RE | Admit: 2017-07-13 | Discharge: 2017-07-13 | Disposition: A | Discharge status: Home / Self Care | Payer: Medicaid Other | Source: Ambulatory Visit | Attending: Obstetrics and Gynecology | Admitting: Obstetrics and Gynecology

## 2017-07-13 ENCOUNTER — Other Ambulatory Visit (HOSPITAL_COMMUNITY): Payer: Self-pay | Admitting: Obstetrics and Gynecology

## 2017-07-13 ENCOUNTER — Encounter (HOSPITAL_COMMUNITY): Payer: Self-pay

## 2017-07-13 DIAGNOSIS — R8271 Bacteriuria: Secondary | ICD-10-CM

## 2017-07-13 DIAGNOSIS — O30043 Twin pregnancy, dichorionic/diamniotic, third trimester: Secondary | ICD-10-CM | POA: Insufficient documentation

## 2017-07-13 DIAGNOSIS — M549 Dorsalgia, unspecified: Secondary | ICD-10-CM

## 2017-07-13 DIAGNOSIS — Z3A35 35 weeks gestation of pregnancy: Secondary | ICD-10-CM | POA: Insufficient documentation

## 2017-07-13 DIAGNOSIS — O30049 Twin pregnancy, dichorionic/diamniotic, unspecified trimester: Secondary | ICD-10-CM

## 2017-07-13 DIAGNOSIS — E669 Obesity, unspecified: Secondary | ICD-10-CM | POA: Insufficient documentation

## 2017-07-13 DIAGNOSIS — O09213 Supervision of pregnancy with history of pre-term labor, third trimester: Secondary | ICD-10-CM | POA: Insufficient documentation

## 2017-07-13 DIAGNOSIS — O99213 Obesity complicating pregnancy, third trimester: Secondary | ICD-10-CM

## 2017-07-13 DIAGNOSIS — O99891 Other specified diseases and conditions complicating pregnancy: Secondary | ICD-10-CM

## 2017-07-13 DIAGNOSIS — O9989 Other specified diseases and conditions complicating pregnancy, childbirth and the puerperium: Secondary | ICD-10-CM

## 2017-07-13 DIAGNOSIS — O4703 False labor before 37 completed weeks of gestation, third trimester: Secondary | ICD-10-CM

## 2017-07-14 ENCOUNTER — Inpatient Hospital Stay (HOSPITAL_COMMUNITY): Payer: Medicaid Other | Admitting: Anesthesiology

## 2017-07-14 ENCOUNTER — Encounter (HOSPITAL_COMMUNITY): Payer: Self-pay | Admitting: *Deleted

## 2017-07-14 ENCOUNTER — Encounter (HOSPITAL_COMMUNITY)
Admission: AD | Disposition: A | Discharge status: Home / Self Care | Payer: Self-pay | Source: Ambulatory Visit | Attending: Family Medicine

## 2017-07-14 ENCOUNTER — Inpatient Hospital Stay (HOSPITAL_COMMUNITY)
Admission: AD | Admit: 2017-07-14 | Discharge: 2017-07-19 | DRG: 784 | Disposition: A | Discharge status: Home / Self Care | Payer: Medicaid Other | Source: Ambulatory Visit | Attending: Family Medicine | Admitting: Family Medicine

## 2017-07-14 ENCOUNTER — Inpatient Hospital Stay (HOSPITAL_COMMUNITY): Payer: Medicaid Other

## 2017-07-14 DIAGNOSIS — Z302 Encounter for sterilization: Secondary | ICD-10-CM

## 2017-07-14 DIAGNOSIS — I9581 Postprocedural hypotension: Secondary | ICD-10-CM | POA: Diagnosis not present

## 2017-07-14 DIAGNOSIS — R55 Syncope and collapse: Secondary | ICD-10-CM | POA: Diagnosis present

## 2017-07-14 DIAGNOSIS — O318X31 Other complications specific to multiple gestation, third trimester, fetus 1: Secondary | ICD-10-CM | POA: Diagnosis not present

## 2017-07-14 DIAGNOSIS — O30043 Twin pregnancy, dichorionic/diamniotic, third trimester: Secondary | ICD-10-CM | POA: Diagnosis present

## 2017-07-14 DIAGNOSIS — O09899 Supervision of other high risk pregnancies, unspecified trimester: Secondary | ICD-10-CM

## 2017-07-14 DIAGNOSIS — O42013 Preterm premature rupture of membranes, onset of labor within 24 hours of rupture, third trimester: Secondary | ICD-10-CM

## 2017-07-14 DIAGNOSIS — Z98891 History of uterine scar from previous surgery: Secondary | ICD-10-CM

## 2017-07-14 DIAGNOSIS — O0990 Supervision of high risk pregnancy, unspecified, unspecified trimester: Secondary | ICD-10-CM

## 2017-07-14 DIAGNOSIS — O134 Gestational [pregnancy-induced] hypertension without significant proteinuria, complicating childbirth: Secondary | ICD-10-CM | POA: Diagnosis present

## 2017-07-14 DIAGNOSIS — O09219 Supervision of pregnancy with history of pre-term labor, unspecified trimester: Secondary | ICD-10-CM

## 2017-07-14 DIAGNOSIS — O902 Hematoma of obstetric wound: Secondary | ICD-10-CM | POA: Diagnosis present

## 2017-07-14 DIAGNOSIS — O42913 Preterm premature rupture of membranes, unspecified as to length of time between rupture and onset of labor, third trimester: Principal | ICD-10-CM | POA: Diagnosis present

## 2017-07-14 DIAGNOSIS — O329XX1 Maternal care for malpresentation of fetus, unspecified, fetus 1: Secondary | ICD-10-CM

## 2017-07-14 DIAGNOSIS — D62 Acute posthemorrhagic anemia: Secondary | ICD-10-CM | POA: Diagnosis not present

## 2017-07-14 DIAGNOSIS — Z3A35 35 weeks gestation of pregnancy: Secondary | ICD-10-CM

## 2017-07-14 DIAGNOSIS — O9089 Other complications of the puerperium, not elsewhere classified: Secondary | ICD-10-CM | POA: Diagnosis present

## 2017-07-14 DIAGNOSIS — O139 Gestational [pregnancy-induced] hypertension without significant proteinuria, unspecified trimester: Secondary | ICD-10-CM

## 2017-07-14 DIAGNOSIS — O9081 Anemia of the puerperium: Secondary | ICD-10-CM | POA: Diagnosis not present

## 2017-07-14 DIAGNOSIS — Z3009 Encounter for other general counseling and advice on contraception: Secondary | ICD-10-CM | POA: Diagnosis present

## 2017-07-14 DIAGNOSIS — O321XX1 Maternal care for breech presentation, fetus 1: Secondary | ICD-10-CM | POA: Diagnosis present

## 2017-07-14 DIAGNOSIS — O30049 Twin pregnancy, dichorionic/diamniotic, unspecified trimester: Secondary | ICD-10-CM | POA: Diagnosis present

## 2017-07-14 DIAGNOSIS — O99214 Obesity complicating childbirth: Secondary | ICD-10-CM | POA: Diagnosis present

## 2017-07-14 DIAGNOSIS — O329XX2 Maternal care for malpresentation of fetus, unspecified, fetus 2: Secondary | ICD-10-CM

## 2017-07-14 DIAGNOSIS — O30003 Twin pregnancy, unspecified number of placenta and unspecified number of amniotic sacs, third trimester: Secondary | ICD-10-CM

## 2017-07-14 LAB — PROTEIN / CREATININE RATIO, URINE
Creatinine, Urine: 96 mg/dL
Protein Creatinine Ratio: 0.47 mg/mg{Cre} — ABNORMAL HIGH (ref 0.00–0.15)
TOTAL PROTEIN, URINE: 45 mg/dL

## 2017-07-14 LAB — CBC
HCT: 32 % — ABNORMAL LOW (ref 36.0–46.0)
Hemoglobin: 10.3 g/dL — ABNORMAL LOW (ref 12.0–15.0)
MCH: 26.1 pg (ref 26.0–34.0)
MCHC: 32.2 g/dL (ref 30.0–36.0)
MCV: 81 fL (ref 78.0–100.0)
PLATELETS: 164 10*3/uL (ref 150–400)
RBC: 3.95 MIL/uL (ref 3.87–5.11)
RDW: 16.9 % — ABNORMAL HIGH (ref 11.5–15.5)
WBC: 8.7 10*3/uL (ref 4.0–10.5)

## 2017-07-14 LAB — COMPREHENSIVE METABOLIC PANEL
ALK PHOS: 117 U/L (ref 38–126)
ALT: 13 U/L — AB (ref 14–54)
AST: 20 U/L (ref 15–41)
Albumin: 2.6 g/dL — ABNORMAL LOW (ref 3.5–5.0)
Anion gap: 10 (ref 5–15)
BUN: 5 mg/dL — ABNORMAL LOW (ref 6–20)
CALCIUM: 8.6 mg/dL — AB (ref 8.9–10.3)
CHLORIDE: 104 mmol/L (ref 101–111)
CO2: 20 mmol/L — ABNORMAL LOW (ref 22–32)
CREATININE: 0.37 mg/dL — AB (ref 0.44–1.00)
Glucose, Bld: 95 mg/dL (ref 65–99)
Potassium: 3.5 mmol/L (ref 3.5–5.1)
Sodium: 134 mmol/L — ABNORMAL LOW (ref 135–145)
Total Bilirubin: 0.6 mg/dL (ref 0.3–1.2)
Total Protein: 6.8 g/dL (ref 6.5–8.1)

## 2017-07-14 LAB — RPR: RPR Ser Ql: NONREACTIVE

## 2017-07-14 LAB — POCT FERN TEST: POCT Fern Test: POSITIVE

## 2017-07-14 SURGERY — Surgical Case
Anesthesia: Spinal

## 2017-07-14 MED ORDER — PROMETHAZINE HCL 25 MG/ML IJ SOLN: 6.2500 mg | INTRAMUSCULAR | Status: DC | PRN

## 2017-07-14 MED ORDER — ENOXAPARIN SODIUM 60 MG/0.6ML ~~LOC~~ SOLN: 60.0000 mg | SUBCUTANEOUS | Status: DC

## 2017-07-14 MED ORDER — SODIUM CHLORIDE 0.9% FLUSH: 9.0000 mL | INTRAVENOUS | Status: DC | PRN

## 2017-07-14 MED ORDER — ONDANSETRON HCL 4 MG/2ML IJ SOLN
INTRAMUSCULAR | Status: DC | PRN
  Administered 2017-07-14: 4 mg via INTRAVENOUS

## 2017-07-14 MED ORDER — LACTATED RINGERS IV SOLN
INTRAVENOUS | Status: DC | PRN
  Administered 2017-07-14 (×3): via INTRAVENOUS

## 2017-07-14 MED ORDER — OXYCODONE-ACETAMINOPHEN 5-325 MG PO TABS
1.0000 | ORAL_TABLET | ORAL | Status: DC | PRN
  Administered 2017-07-15 – 2017-07-18 (×2): 1 via ORAL
  Filled 2017-07-14 (×2): qty 1

## 2017-07-14 MED ORDER — PHENYLEPHRINE 8 MG IN D5W 100 ML (0.08MG/ML) PREMIX OPTIME
INJECTION | INTRAVENOUS | Status: AC
  Filled 2017-07-14: qty 100

## 2017-07-14 MED ORDER — HYDROMORPHONE 1 MG/ML IV SOLN
INTRAVENOUS | Status: DC
  Administered 2017-07-14: 0.3 mg via INTRAVENOUS
  Administered 2017-07-14: 07:00:00 via INTRAVENOUS
  Administered 2017-07-14: 2.1 mg via INTRAVENOUS
  Administered 2017-07-14 (×2): 0.3 mg via INTRAVENOUS
  Administered 2017-07-15: 0 mg via INTRAVENOUS
  Administered 2017-07-15: 0.9 mg via INTRAVENOUS
  Filled 2017-07-14: qty 25

## 2017-07-14 MED ORDER — FUSION PLUS PO CAPS: 1.0000 | ORAL_CAPSULE | Freq: Every day | ORAL | Status: DC

## 2017-07-14 MED ORDER — ENOXAPARIN SODIUM 40 MG/0.4ML ~~LOC~~ SOLN: 40.0000 mg | SUBCUTANEOUS | Status: DC

## 2017-07-14 MED ORDER — SIMETHICONE 80 MG PO CHEW
80.0000 mg | CHEWABLE_TABLET | ORAL | Status: DC
  Administered 2017-07-14 – 2017-07-19 (×4): 80 mg via ORAL
  Filled 2017-07-14 (×4): qty 1

## 2017-07-14 MED ORDER — CONCEPT OB 130-92.4-1 MG PO CAPS
1.0000 | ORAL_CAPSULE | Freq: Every day | ORAL | Status: DC
Start: 2017-07-14 — End: 2017-07-14

## 2017-07-14 MED ORDER — HYDROMORPHONE HCL 1 MG/ML IJ SOLN: 0.2500 mg | INTRAMUSCULAR | Status: DC | PRN

## 2017-07-14 MED ORDER — SODIUM CHLORIDE 0.9 % IV SOLN
500.0000 mg | Freq: Once | INTRAVENOUS | Status: AC
  Administered 2017-07-14 (×2): 500 mg via INTRAVENOUS
  Filled 2017-07-14: qty 500

## 2017-07-14 MED ORDER — ACETAMINOPHEN 10 MG/ML IV SOLN
1000.0000 mg | Freq: Once | INTRAVENOUS | Status: DC | PRN
Start: 2017-07-14 — End: 2017-07-14
  Administered 2017-07-14: 1000 mg via INTRAVENOUS

## 2017-07-14 MED ORDER — FENTANYL CITRATE (PF) 100 MCG/2ML IJ SOLN
INTRAMUSCULAR | Status: AC
  Filled 2017-07-14: qty 2

## 2017-07-14 MED ORDER — DIBUCAINE 1 % RE OINT: 1.0000 "application " | TOPICAL_OINTMENT | RECTAL | Status: DC | PRN

## 2017-07-14 MED ORDER — CEFAZOLIN SODIUM-DEXTROSE 2-4 GM/100ML-% IV SOLN
2.0000 g | Freq: Once | INTRAVENOUS | Status: AC
  Administered 2017-07-14: 2 g via INTRAVENOUS

## 2017-07-14 MED ORDER — OXYTOCIN 10 UNIT/ML IJ SOLN
INTRAMUSCULAR | Status: AC
  Filled 2017-07-14: qty 4

## 2017-07-14 MED ORDER — LACTATED RINGERS IV SOLN
INTRAVENOUS | Status: DC
  Administered 2017-07-14 – 2017-07-15 (×2): via INTRAVENOUS

## 2017-07-14 MED ORDER — OXYTOCIN 10 UNIT/ML IJ SOLN
INTRAVENOUS | Status: DC | PRN
  Administered 2017-07-14: 40 [IU] via INTRAVENOUS

## 2017-07-14 MED ORDER — BETAMETHASONE SOD PHOS & ACET 6 (3-3) MG/ML IJ SUSP
12.0000 mg | Freq: Once | INTRAMUSCULAR | Status: AC
  Administered 2017-07-14: 12 mg via INTRAMUSCULAR
  Filled 2017-07-14: qty 2

## 2017-07-14 MED ORDER — ZOLPIDEM TARTRATE 5 MG PO TABS: 5.0000 mg | ORAL_TABLET | Freq: Every evening | ORAL | Status: DC | PRN

## 2017-07-14 MED ORDER — TETANUS-DIPHTH-ACELL PERTUSSIS 5-2.5-18.5 LF-MCG/0.5 IM SUSP: 0.5000 mL | Freq: Once | INTRAMUSCULAR | Status: DC

## 2017-07-14 MED ORDER — COCONUT OIL OIL: 1.0000 "application " | TOPICAL_OIL | Status: DC | PRN

## 2017-07-14 MED ORDER — DIPHENHYDRAMINE HCL 25 MG PO CAPS: 25.0000 mg | ORAL_CAPSULE | Freq: Four times a day (QID) | ORAL | Status: DC | PRN

## 2017-07-14 MED ORDER — ENOXAPARIN SODIUM 60 MG/0.6ML ~~LOC~~ SOLN
60.0000 mg | SUBCUTANEOUS | Status: DC
  Administered 2017-07-14 – 2017-07-15 (×2): 60 mg via SUBCUTANEOUS
  Filled 2017-07-14 (×3): qty 0.6

## 2017-07-14 MED ORDER — OXYCODONE-ACETAMINOPHEN 5-325 MG PO TABS
2.0000 | ORAL_TABLET | ORAL | Status: DC | PRN
  Administered 2017-07-16: 2 via ORAL
  Filled 2017-07-14: qty 2

## 2017-07-14 MED ORDER — SIMETHICONE 80 MG PO CHEW: 80.0000 mg | CHEWABLE_TABLET | ORAL | Status: DC | PRN

## 2017-07-14 MED ORDER — NALOXONE HCL 0.4 MG/ML IJ SOLN: 0.4000 mg | INTRAMUSCULAR | Status: DC | PRN

## 2017-07-14 MED ORDER — ACETAMINOPHEN 10 MG/ML IV SOLN
INTRAVENOUS | Status: AC
  Filled 2017-07-14: qty 100

## 2017-07-14 MED ORDER — PHENYLEPHRINE 8 MG IN D5W 100 ML (0.08MG/ML) PREMIX OPTIME
INJECTION | INTRAVENOUS | Status: DC | PRN
  Administered 2017-07-14: 100 ug/min via INTRAVENOUS

## 2017-07-14 MED ORDER — WITCH HAZEL-GLYCERIN EX PADS: 1.0000 "application " | MEDICATED_PAD | CUTANEOUS | Status: DC | PRN

## 2017-07-14 MED ORDER — MEPERIDINE HCL 25 MG/ML IJ SOLN: 6.2500 mg | INTRAMUSCULAR | Status: DC | PRN

## 2017-07-14 MED ORDER — DIPHENHYDRAMINE HCL 50 MG/ML IJ SOLN: 12.5000 mg | Freq: Four times a day (QID) | INTRAMUSCULAR | Status: DC | PRN

## 2017-07-14 MED ORDER — FENTANYL CITRATE (PF) 100 MCG/2ML IJ SOLN
100.0000 ug | Freq: Once | INTRAMUSCULAR | Status: AC
  Administered 2017-07-14: 100 ug via INTRAVENOUS
  Filled 2017-07-14: qty 2

## 2017-07-14 MED ORDER — ONDANSETRON HCL 4 MG/2ML IJ SOLN
INTRAMUSCULAR | Status: AC
  Filled 2017-07-14: qty 2

## 2017-07-14 MED ORDER — SIMETHICONE 80 MG PO CHEW
80.0000 mg | CHEWABLE_TABLET | Freq: Three times a day (TID) | ORAL | Status: DC
  Administered 2017-07-14 – 2017-07-18 (×9): 80 mg via ORAL
  Filled 2017-07-14 (×10): qty 1

## 2017-07-14 MED ORDER — SODIUM CHLORIDE 0.9 % IV SOLN
2.0000 g | Freq: Once | INTRAVENOUS | Status: DC
  Filled 2017-07-14: qty 2000

## 2017-07-14 MED ORDER — SENNOSIDES-DOCUSATE SODIUM 8.6-50 MG PO TABS
2.0000 | ORAL_TABLET | ORAL | Status: DC
  Administered 2017-07-14 – 2017-07-17 (×2): 2 via ORAL
  Filled 2017-07-14 (×4): qty 2

## 2017-07-14 MED ORDER — PRENATAL MULTIVITAMIN CH
1.0000 | ORAL_TABLET | Freq: Every day | ORAL | Status: DC
  Administered 2017-07-15 – 2017-07-18 (×4): 1 via ORAL
  Filled 2017-07-14 (×5): qty 1

## 2017-07-14 MED ORDER — ONDANSETRON HCL 4 MG/2ML IJ SOLN: 4.0000 mg | Freq: Four times a day (QID) | INTRAMUSCULAR | Status: DC | PRN

## 2017-07-14 MED ORDER — ACETAMINOPHEN 10 MG/ML IV SOLN: 1000.0000 mg | Freq: Once | INTRAVENOUS | Status: DC | PRN

## 2017-07-14 MED ORDER — MENTHOL 3 MG MT LOZG: 1.0000 | LOZENGE | OROMUCOSAL | Status: DC | PRN

## 2017-07-14 MED ORDER — HYDROCODONE-ACETAMINOPHEN 7.5-325 MG PO TABS: 1.0000 | ORAL_TABLET | Freq: Once | ORAL | Status: DC | PRN

## 2017-07-14 MED ORDER — DIPHENHYDRAMINE HCL 12.5 MG/5ML PO ELIX
12.5000 mg | ORAL_SOLUTION | Freq: Four times a day (QID) | ORAL | Status: DC | PRN
  Filled 2017-07-14: qty 5

## 2017-07-14 MED ORDER — BUPIVACAINE IN DEXTROSE 0.75-8.25 % IT SOLN
INTRATHECAL | Status: DC | PRN
  Administered 2017-07-14: 1.7 mg via INTRATHECAL

## 2017-07-14 MED ORDER — IBUPROFEN 600 MG PO TABS
600.0000 mg | ORAL_TABLET | Freq: Four times a day (QID) | ORAL | Status: DC
  Administered 2017-07-14 – 2017-07-18 (×13): 600 mg via ORAL
  Filled 2017-07-14 (×18): qty 1

## 2017-07-14 MED ORDER — MORPHINE SULFATE (PF) 0.5 MG/ML IJ SOLN
INTRAMUSCULAR | Status: AC
  Filled 2017-07-14: qty 10

## 2017-07-14 MED ORDER — OXYTOCIN 40 UNITS IN LACTATED RINGERS INFUSION - SIMPLE MED: 2.5000 [IU]/h | INTRAVENOUS | Status: AC

## 2017-07-14 MED ORDER — ACETAMINOPHEN 325 MG PO TABS: 650.0000 mg | ORAL_TABLET | ORAL | Status: DC | PRN

## 2017-07-14 SURGICAL SUPPLY — 33 items
APL SKNCLS STERI-STRIP NONHPOA (GAUZE/BANDAGES/DRESSINGS) ×1
BENZOIN TINCTURE PRP APPL 2/3 (GAUZE/BANDAGES/DRESSINGS) ×3 IMPLANT
CHLORAPREP W/TINT 26ML (MISCELLANEOUS) ×3 IMPLANT
CLAMP CORD UMBIL (MISCELLANEOUS) IMPLANT
CLOSURE STERI STRIP 1/2 X4 (GAUZE/BANDAGES/DRESSINGS) ×2 IMPLANT
CLOTH BEACON ORANGE TIMEOUT ST (SAFETY) ×3 IMPLANT
DRSG OPSITE POSTOP 4X10 (GAUZE/BANDAGES/DRESSINGS) ×3 IMPLANT
ELECT REM PT RETURN 9FT ADLT (ELECTROSURGICAL) ×3
ELECTRODE REM PT RTRN 9FT ADLT (ELECTROSURGICAL) ×1 IMPLANT
EXTRACTOR VACUUM M CUP 4 TUBE (SUCTIONS) IMPLANT
EXTRACTOR VACUUM M CUP 4' TUBE (SUCTIONS)
GLOVE BIOGEL PI IND STRL 7.0 (GLOVE) ×2 IMPLANT
GLOVE BIOGEL PI IND STRL 7.5 (GLOVE) ×2 IMPLANT
GLOVE BIOGEL PI INDICATOR 7.0 (GLOVE) ×4
GLOVE BIOGEL PI INDICATOR 7.5 (GLOVE) ×4
GLOVE ECLIPSE 7.5 STRL STRAW (GLOVE) ×3 IMPLANT
GOWN STRL REUS W/TWL LRG LVL3 (GOWN DISPOSABLE) ×9 IMPLANT
KIT ABG SYR 3ML LUER SLIP (SYRINGE) IMPLANT
NDL HYPO 25X5/8 SAFETYGLIDE (NEEDLE) IMPLANT
NEEDLE HYPO 25X5/8 SAFETYGLIDE (NEEDLE) IMPLANT
NS IRRIG 1000ML POUR BTL (IV SOLUTION) ×3 IMPLANT
PACK C SECTION WH (CUSTOM PROCEDURE TRAY) ×3 IMPLANT
PAD OB MATERNITY 4.3X12.25 (PERSONAL CARE ITEMS) ×3 IMPLANT
PENCIL SMOKE EVAC W/HOLSTER (ELECTROSURGICAL) ×3 IMPLANT
RTRCTR C-SECT PINK 25CM LRG (MISCELLANEOUS) ×3 IMPLANT
STRIP CLOSURE SKIN 1/2X4 (GAUZE/BANDAGES/DRESSINGS) ×2 IMPLANT
SUT VIC AB 0 CTX 36 (SUTURE) ×9
SUT VIC AB 0 CTX36XBRD ANBCTRL (SUTURE) ×3 IMPLANT
SUT VIC AB 2-0 CT1 27 (SUTURE) ×3
SUT VIC AB 2-0 CT1 TAPERPNT 27 (SUTURE) ×1 IMPLANT
SUT VIC AB 4-0 KS 27 (SUTURE) ×3 IMPLANT
TOWEL OR 17X24 6PK STRL BLUE (TOWEL DISPOSABLE) ×3 IMPLANT
TRAY FOLEY BAG SILVER LF 14FR (SET/KITS/TRAYS/PACK) ×3 IMPLANT

## 2017-07-14 NOTE — Anesthesia Postprocedure Evaluation (Signed)
Anesthesia Post Note  Patient: Krystal Chan  Procedure(s) Performed: CESAREAN SECTION (N/A )     Anesthesia Type: Spinal Level of consciousness: awake and alert Pain management: pain level controlled Vital Signs Assessment: post-procedure vital signs reviewed and stable Respiratory status: spontaneous breathing Cardiovascular status: blood pressure returned to baseline and stable Postop Assessment: no headache, patient able to bend at knees, adequate PO intake, spinal receding, no backache and no apparent nausea or vomiting Anesthetic complications: no    Last Vitals:  Vitals:   07/14/17 1103 07/14/17 1300  BP:  130/76  Pulse:  85  Resp: 20 20  Temp:  36.7 C  SpO2: 94% 94%    Last Pain:  Vitals:   07/14/17 1300  TempSrc: Oral  PainSc:    Pain Goal: Patients Stated Pain Goal: 0 (07/14/17 0215)               [name], [name]

## 2017-07-14 NOTE — H&P (Signed)
Krystal Chan is a 32 y.o. female presenting for Leaking of fluid since 0130 tonight.  Onset of painful contractions soon thereafter.  No bleeding. Known Di-Di twins with malpresentation as of yesterday  Patient Active Problem List   Diagnosis Date Noted  . Back pain complicating pregnancy 06/21/2017  . Preterm uterine contractions in third trimester, antepartum 06/21/2017  . Unwanted fertility 06/09/2017  . GBS bacteriuria 02/06/2017  . Dichorionic diamniotic twin pregnancy, antepartum 01/28/2017  . Supervision of high risk pregnancy, antepartum 02/28/2015  . History of preterm delivery, currently pregnant 02/28/2015   . OB History    Gravida Para Term Preterm AB Living   6 3 2 1 2 3    SAB TAB Ectopic Multiple Live Births     2   0 3     Past Medical History:  Diagnosis Date  . History of preterm delivery, currently pregnant 02/28/2015   [ ] 17-P  . Umbilical hernia    repaired ~2004   Past Surgical History:  Procedure Laterality Date  . HERNIA REPAIR     gastic hernia 2005 and umbilical hernia 2004   Family History: family history includes Breast cancer in her maternal grandmother; Cancer in her maternal grandmother; Diabetes in her father and paternal grandmother; Heart disease in her mother; Hypertension in her maternal grandmother, mother, and paternal grandmother. Social History:  reports that  has never smoked. she has never used smokeless tobacco. She reports that she does not drink alcohol or use drugs.     Maternal Diabetes: No Genetic Screening: Normal Maternal Ultrasounds/Referrals: Normal Fetal Ultrasounds or other Referrals:  None Maternal Substance Abuse:  No Significant Maternal Medications:  None Significant Maternal Lab Results:  Lab values include: Other:   Unknown GBS Other Comments:  Breech twins  Review of Systems  Constitutional: Negative for chills and fever.  Eyes: Negative for blurred vision.  Respiratory: Negative for shortness of breath.    Cardiovascular: Positive for leg swelling. Negative for chest pain.  Gastrointestinal: Positive for abdominal pain. Negative for constipation, diarrhea, nausea and vomiting.  Genitourinary: Negative for dysuria.  Musculoskeletal: Positive for back pain.   Maternal Medical History:  Reason for admission: Rupture of membranes.  Nausea.  Contractions: Onset was 1-2 hours ago.   Frequency: regular.   Perceived severity is moderate.    Fetal activity: Perceived fetal activity is normal.   Last perceived fetal movement was within the past hour.    Prenatal complications: PIH (new) and preterm labor.   No bleeding.   Prenatal Complications - Diabetes: none.    Dilation: 5.5 Exam by:: Artelia Laroche, CNM Blood pressure (!) 152/86, pulse 96, temperature 98.7 F (37.1 C), temperature source Oral, resp. rate 19, last menstrual period 11/09/2016, unknown if currently breastfeeding. Maternal Exam:  Uterine Assessment: Contraction strength is moderate.  Contraction frequency is regular.   Abdomen: Patient reports no abdominal tenderness. Fetal presentation: breech  Introitus: Normal vulva. Normal vagina.  Ferning test: positive.  Nitrazine test: not done. Amniotic fluid character: clear.  Pelvis: adequate for delivery.   Cervix: Cervix evaluated by digital exam.     Fetal Exam Fetal Monitor Review: Mode: ultrasound.   Baseline rate: 140.  Variability: moderate (6-25 bpm).   Pattern: accelerations present and no decelerations.    Fetal State Assessment: Category I - tracings are normal.    Dilation: 5.5 Presentation: Complete Breech Exam by:: Artelia Laroche, CNM    Prenatal labs: ABO, Rh: A/Positive/-- (09/20 1133) Antibody: Negative (09/20 1133) Rubella: 1.37 (09/20  1133) RPR: Non Reactive (01/09 0833)  HBsAg: Negative (09/20 1133)  HIV: Non Reactive (01/09 0833)  GBS:     Assessment/Plan: Twin IUP at 755w2d Preterm Premature Rupture of Membranes Preterm  labor Malpresentation, first twin breech, complete New Gestational Hypertension  Admit for delivery, but twins will likely need to be transferred due to NICU being full Routine orders Bedside US for presentation of both twins PIH labs Plan per MD    Wynelle BourgeoisMarie Laela Deviney 07/14/2017, 3:25 AM

## 2017-07-14 NOTE — Anesthesia Preprocedure Evaluation (Addendum)
Anesthesia Evaluation  Patient identified by MRN, date of birth, ID band Patient awake    Reviewed: Allergy & Precautions, H&P , NPO status , Patient's Chart, lab work & pertinent test results  History of Anesthesia Complications Negative for: history of anesthetic complications  Airway Mallampati: II  TM Distance: >3 FB Neck ROM: full    Dental no notable dental hx. (+) Teeth Intact   Pulmonary neg pulmonary ROS,    Pulmonary exam normal breath sounds clear to auscultation       Cardiovascular negative cardio ROS Normal cardiovascular exam Rhythm:regular Rate:Normal     Neuro/Psych negative neurological ROS  negative psych ROS   GI/Hepatic negative GI ROS, Neg liver ROS,   Endo/Other  negative endocrine ROS  Renal/GU negative Renal ROS  negative genitourinary   Musculoskeletal   Abdominal   Peds  Hematology negative hematology ROS (+)   Anesthesia Other Findings   Reproductive/Obstetrics (+) Pregnancy                             Lab Results  Component Value Date   WBC 8.7 07/14/2017   HGB 10.3 (L) 07/14/2017   HCT 32.0 (L) 07/14/2017   MCV 81.0 07/14/2017   PLT 164 07/14/2017   Lab Results  Component Value Date   CREATININE 0.37 (L) 07/14/2017   BUN 5 (L) 07/14/2017   NA 134 (L) 07/14/2017   K 3.5 07/14/2017   CL 104 07/14/2017   CO2 20 (L) 07/14/2017     Anesthesia Physical Anesthesia Plan  ASA: III  Anesthesia Plan: Spinal   Post-op Pain Management:  Regional for Post-op pain   Induction:   PONV Risk Score and Plan:   Airway Management Planned: Natural Airway, Nasal Cannula and Mask  Additional Equipment:   Intra-op Plan:   Post-operative Plan:   Informed Consent: I have reviewed the patients History and Physical, chart, labs and discussed the procedure including the risks, benefits and alternatives for the proposed anesthesia with the patient or  authorized representative who has indicated his/her understanding and acceptance.     Plan Discussed with: CRNA  Anesthesia Plan Comments:         Anesthesia Quick Evaluation

## 2017-07-14 NOTE — Transfer of Care (Signed)
Immediate Anesthesia Transfer of Care Note  Patient: Krystal Chan  Procedure(s) Performed: CESAREAN SECTION (N/A )  Patient Location: PACU  Anesthesia Type:Spinal  Level of Consciousness: awake  Airway & Oxygen Therapy: Patient Spontanous Breathing  Post-op Assessment: Report given to RN and Post -op Vital signs reviewed and stable  Post vital signs: stable  Last Vitals:  Vitals:   07/14/17 0331 07/14/17 0346  BP: (!) 147/75 137/82  Pulse: 84 91  Resp:    Temp:      Last Pain:  Vitals:   07/14/17 0247  TempSrc: Oral  PainSc:       Patients Stated Pain Goal: 0 (07/14/17 0215)  Complications: No apparent anesthesia complications

## 2017-07-14 NOTE — MAU Note (Signed)
Pt presents to MAU via ems c/o SROM @0130  pt reports a clear fluid. Pt denies bleeding. +FM.

## 2017-07-14 NOTE — Anesthesia Procedure Notes (Signed)
Spinal  Patient location during procedure: OB Start time: 07/14/2017 4:28 AM End time: 07/14/2017 4:39 AM Staffing Anesthesiologist: Trevor IhaHouser, Yanilen Adamik A, MD Performed: anesthesiologist  Preanesthetic Checklist Completed: patient identified, surgical consent, pre-op evaluation, timeout performed, IV checked, risks and benefits discussed and monitors and equipment checked Spinal Block Patient position: sitting Prep: site prepped and draped and DuraPrep Patient monitoring: heart rate, cardiac monitor, continuous pulse ox and blood pressure Approach: midline Location: L3-4 Injection technique: single-shot Needle Needle type: Pencan  Needle gauge: 24 G Needle length: 10 cm Assessment Sensory level: T4

## 2017-07-14 NOTE — Lactation Note (Signed)
This note was copied from a baby's chart. Lactation Consultation Note  Patient Name: Krystal Chan MVHQI'OToday's Date: 07/14/2017 Reason for consult: Initial assessment;Infant < 6lbs;Late-preterm 34-36.6wks;Multiple gestation Breastfeeding consultation and late preterm information given and reviewed with mom.  Babies are 35.2 and 9 hours old.  Baby B was initially under oxyhood but now in with mom.  Assisted mom with pumping on initiation setting.  No colostrum obtained.  Mom was able to hand express 5 mls earlier today.  Babies given 10 mls of neosure and both tolerated well.  Instructed to attempt breast with cues and then pump and supplement with 10 mls of expressed milk/formula using slow flow nipple and paced feeding.  Babies need fed every 3 hours.  Maternal Data    Feeding Feeding Type: Bottle Fed - Formula Nipple Type: Slow - flow Length of feed: 0 min  LATCH Score                   Interventions    Lactation Tools Discussed/Used Pump Review: Setup, frequency, and cleaning;Milk Storage Initiated by:: LM Date initiated:: 07/14/17   Consult Status Consult Status: Follow-up Date: 07/15/17 Follow-up type: In-patient    Huston FoleyMOULDEN, Kazi Reppond S 07/14/2017, 2:45 PM

## 2017-07-14 NOTE — Op Note (Signed)
Krystal Chan PROCEDURE DATE: 07/14/2017  PREOPERATIVE DIAGNOSES: Intrauterine pregnancy at [redacted]w[redacted]d weeks gestation; malpresentation: twin A breech, PPROM and labor  POSTOPERATIVE DIAGNOSES: The same  PROCEDURE: Primary Low Transverse Cesarean Section  SURGEON, primary: Candelaria Celeste, DO SURGEON, fellow: Rolm Bookbinder, DO  ANESTHESIOLOGY TEAM: Anesthesiologist: Trevor Iha, MD CRNA: Renford Dills, CRNA  INDICATIONS: Krystal Chan is a 32 y.o. [phone] at [redacted]w[redacted]d here for cesarean section secondary to the indications listed under preoperative diagnoses; please see preoperative note for further details.  The risks of cesarean section were discussed with the patient including but were not limited to: bleeding which may require transfusion or reoperation; infection which may require antibiotics; injury to bowel, bladder, ureters or other surrounding organs; injury to the fetus; need for additional procedures including hysterectomy in the event of a life-threatening hemorrhage; placental abnormalities wth subsequent pregnancies, incisional problems, thromboembolic phenomenon and other postoperative/anesthesia complications.   The patient concurred with the proposed plan, giving informed written consent for the procedure.    FINDINGS:   Twin A: Viable female infant in breech presentation.  Apgars 7 and 9.   Twin B: Viable female infant in cephalic presentation. Apgars 8 and 9.  Clear amniotic fluid.  Intact placenta, three vessel cord.  Normal uterus, fallopian tubes and ovaries bilaterally.  ANESTHESIA: Spinal  ESTIMATED BLOOD LOSS: 1128 ml URINE OUTPUT:  150 ml SPECIMENS: Placenta sent to pathology. Bilateral fallopian tube segments to pathology COMPLICATIONS: None immediate  PROCEDURE IN DETAIL:  The patient preoperatively received intravenous antibiotics and had sequential compression devices applied to her lower extremities.  She was then taken to the operating room where spinal anesthesia was  administered and was found to be adequate. She was then placed in a dorsal supine position with a leftward tilt, and prepped and draped in a sterile manner.  A foley catheter was placed into her bladder and attached to constant gravity.  After an adequate timeout was performed, a Pfannenstiel skin incision was made with scalpel and carried through to the underlying layer of fascia. The fascia was incised in the midline, and this incision was extended bilaterally using the Mayo scissors.  Kocher clamps were applied to the superior aspect of the fascial incision and the underlying rectus muscles were dissected off bluntly.  A similar process was carried out on the inferior aspect of the fascial incision. The rectus muscles were separated in the midline bluntly and the peritoneum was entered bluntly. Attention was turned to the lower uterine segment where a low transverse hysterotomy was made with a scalpel and extended bilaterally bluntly.  Baby A was breech presentation and was successfully delivered via breech extraction, the cord was clamped and cut, and the infant was handed over to the awaiting neonatology team. Baby B's amniotic sac was ruptured, then baby was delivered in cephalic position. Cord was clamped and cut and baby handed over to neonatology team. Uterine massage was then administered, and the placenta was manually extracted. Placenta was intact with a three-vessel cord. The uterus was then cleared of clots and debris.  The hysterotomy was closed with 0 Vicryl in a running locked fashion, and an imbricating layer was also placed with 0 Vicryl.  Figure-of-eight 0 Vicryl serosal stitches were placed to help with hemostasis.  The patient's left fallopian tube was then identified, and the Babcock clamp was then used to grasp the tube approximately 3 cm from the cornual region. A 3 cm segment of the tube was then ligated with free tie of  plain gut suture x2, transected and excised. Good hemostasis was  noted. The right fallopian tube was then identified, ligated x2, and a 3 cm segment excised in a similar fashion allowing for bilateral tubal sterilization. Excellent hemostasis was noted.   The pelvis was cleared of all clot and debris. Hemostasis was confirmed on all surfaces.  The peritoneum was closed with a 0 Vicryl running stitch. The fascia was then closed using 0 Vicryl in a running fashion.  The subcutaneous layer was irrigated. The skin was closed with a 4-0 Vicryl Keith needle in subcuticular fashion. The patient tolerated the procedure well. Sponge, lap, instrument and needle counts were correct x 3.  She was taken to the recovery room in stable condition.   Will start patient on dilaudid PCA for pain control.   Rolm BookbinderAmber Geovannie Vilar, DO Faculty Practice, [facility]

## 2017-07-15 ENCOUNTER — Other Ambulatory Visit: Payer: Self-pay

## 2017-07-15 ENCOUNTER — Ambulatory Visit: Payer: Medicaid Other

## 2017-07-15 LAB — CBC
HCT: 27.4 % — ABNORMAL LOW (ref 36.0–46.0)
HEMOGLOBIN: 8.9 g/dL — AB (ref 12.0–15.0)
MCH: 26.5 pg (ref 26.0–34.0)
MCHC: 32.5 g/dL (ref 30.0–36.0)
MCV: 81.5 fL (ref 78.0–100.0)
Platelets: 176 10*3/uL (ref 150–400)
RBC: 3.36 MIL/uL — ABNORMAL LOW (ref 3.87–5.11)
RDW: 17 % — AB (ref 11.5–15.5)
WBC: 15.7 10*3/uL — ABNORMAL HIGH (ref 4.0–10.5)

## 2017-07-15 MED ORDER — FERROUS SULFATE 325 (65 FE) MG PO TABS
325.0000 mg | ORAL_TABLET | Freq: Two times a day (BID) | ORAL | Status: DC
  Administered 2017-07-15 – 2017-07-18 (×6): 325 mg via ORAL
  Filled 2017-07-15 (×6): qty 1

## 2017-07-15 NOTE — Progress Notes (Signed)
Wasted PCA Hydromorphone 25mg /5825ml syringe. 17.405mls wasted with Dolly RiasKim Isley, RN

## 2017-07-15 NOTE — Progress Notes (Signed)
17.5 ml of PCA Hydromorphone 25mg /10625ml syring was wasted in the sink with Wendall PapaEmily Ray, RN.

## 2017-07-15 NOTE — Progress Notes (Signed)
POSTPARTUM PROGRESS NOTE  Post Partum Day 1 Subjective:  Krystal Chan is a 32 y.o. [id number] 5874w2d s/p pLTCS/BTL for PPROM and breech/vertex di/di twins.  No acute events overnight.  Pt denies problems with ambulating, voiding or po intake. She has not had a BM or flatus post-op. Denies n/v. Pain is well controlled on Ibuprofen and Tylenol. Lochia Moderate.   Objective: Blood pressure 130/71, pulse 77, temperature 98.4 F (36.9 C), temperature source Oral, resp. rate (!) 21, last menstrual period 11/09/2016, SpO2 97 %, unknown if currently breastfeeding.  Physical Exam:  General: alert, cooperative and no distress Lochia:normal flow Chest: no respiratory distress Heart:regular rate, distal pulses intact Abdomen: soft, nontender, surgical dressings with minimal blood  Uterine Fundus: firm, appropriately tender DVT Evaluation: No calf swelling or tenderness Extremities: no edema   Recent Labs    07/14/17 0254 07/15/17 0602  HGB 10.3* 8.9*  HCT 32.0* 27.4*    Assessment/Plan:  ASSESSMENT: Krystal Chan is a 32 y.o. [id number] 7374w2d s/p pLTCS/BTL for PPROM and breech/vertex di/di twins.  MOF: Breast/Bottle MOC: BTL Plan for discharge tomorrow   LOS: 1 day   Theodis SatoStephanie R Corder3/11/2017, 7:36 AM

## 2017-07-16 ENCOUNTER — Encounter (HOSPITAL_COMMUNITY): Payer: Self-pay | Admitting: Family Medicine

## 2017-07-16 DIAGNOSIS — O139 Gestational [pregnancy-induced] hypertension without significant proteinuria, unspecified trimester: Secondary | ICD-10-CM

## 2017-07-16 DIAGNOSIS — D62 Acute posthemorrhagic anemia: Secondary | ICD-10-CM | POA: Insufficient documentation

## 2017-07-16 LAB — CBC
HCT: 20.4 % — ABNORMAL LOW (ref 36.0–46.0)
HEMATOCRIT: 23 % — AB (ref 36.0–46.0)
Hemoglobin: 6.5 g/dL — CL (ref 12.0–15.0)
Hemoglobin: 8 g/dL — ABNORMAL LOW (ref 12.0–15.0)
MCH: 25.7 pg — ABNORMAL LOW (ref 26.0–34.0)
MCH: 28.5 pg (ref 26.0–34.0)
MCHC: 31.9 g/dL (ref 30.0–36.0)
MCHC: 34.8 g/dL (ref 30.0–36.0)
MCV: 80.6 fL (ref 78.0–100.0)
MCV: 81.9 fL (ref 78.0–100.0)
PLATELETS: 201 10*3/uL (ref 150–400)
PLATELETS: 165 10*3/uL (ref 150–400)
RBC: 2.53 MIL/uL — ABNORMAL LOW (ref 3.87–5.11)
RBC: 2.81 MIL/uL — ABNORMAL LOW (ref 3.87–5.11)
RDW: 17.4 % — ABNORMAL HIGH (ref 11.5–15.5)
RDW: 15.8 % — AB (ref 11.5–15.5)
WBC: 12.6 10*3/uL — AB (ref 4.0–10.5)
WBC: 13.9 10*3/uL — AB (ref 4.0–10.5)

## 2017-07-16 LAB — PREPARE RBC (CROSSMATCH)

## 2017-07-16 MED ORDER — ENOXAPARIN SODIUM 60 MG/0.6ML ~~LOC~~ SOLN
60.0000 mg | SUBCUTANEOUS | Status: DC
  Administered 2017-07-16: 18:00:00 60 mg via SUBCUTANEOUS
  Filled 2017-07-16 (×2): qty 0.6

## 2017-07-16 MED ORDER — LACTATED RINGERS IV BOLUS (SEPSIS)
1000.0000 mL | Freq: Once | INTRAVENOUS | Status: AC
  Administered 2017-07-16: 1000 mL via INTRAVENOUS

## 2017-07-16 MED ORDER — LACTATED RINGERS IV SOLN
INTRAVENOUS | Status: DC
  Administered 2017-07-17: via INTRAVENOUS

## 2017-07-16 MED ORDER — SODIUM CHLORIDE 0.9 % IV SOLN
Freq: Once | INTRAVENOUS | Status: AC
  Administered 2017-07-16: 14:00:00 via INTRAVENOUS

## 2017-07-16 NOTE — Discharge Instructions (Signed)

## 2017-07-16 NOTE — Progress Notes (Signed)
Called to evaluate pt after syncopal episode while getting up to void. Had been walking earlier all day w/o problems.  Pt's BP was 88/50 P. 96.  See nurse's notes for fall details. Pt did not hit the floor.  Pt responded to amonia, c/o feeling dizzy and hot.  BP quickly up to baseline, 114/70, P 95.  Hgb 10>8.9 POD #1.  Bleeding minimal, output normal. WIll check CBC in am

## 2017-07-16 NOTE — Lactation Note (Signed)
This note was copied from a baby's chart. Lactation Consultation Note Mom of twins at 6944 hrs old, has decided to just exclusively formula feed.  Discussed pumping and bottle feeding BM and milk storage.  Discussed engorgement and management.  Patient Name: Krystal Chan Krystal Chan [id number]'s Date: 07/16/2017 Reason for consult: Follow-up assessment   Maternal Data    Feeding    LATCH Score                   Interventions    Lactation Tools Discussed/Used     Consult Status Consult Status: Complete Date: 07/16/17    Charyl DancerCARVER, Verlin Uher G 07/16/2017, 1:01 AM

## 2017-07-16 NOTE — Progress Notes (Signed)
   07/16/17 0100  What Happened  Was fall witnessed? Yes  Who witnessed fall? Kae HellerAraceli Rodriguez, RN   Patients activity before fall ambulating-assisted  Point of contact other (comment) (knees)  Was patient injured? No  Follow Up  MD notified Marylee FlorasFran Cres-Dishmon, CNM   Time MD notified [phone]  Family notified Yes-comment (mother )  Time family notified 0058  Progress note created (see row info) Yes  Adult Fall Risk Assessment  Risk Factor Category (scoring not indicated) Fall has occurred during this admission (document High fall risk)  Age 32  Fall History: Fall within 6 months prior to admission 0  Elimination; Bowel and/or Urine Incontinence 0  Elimination; Bowel and/or Urine Urgency/Frequency 0  Medications: includes PCA/Opiates, Anti-convulsants, Anti-hypertensives, Diuretics, Hypnotics, Laxatives, Sedatives, and Psychotropics 3  Patient Care Equipment 0  Mobility-Assistance 2  Mobility-Gait 2  Mobility-Sensory Deficit 0  Altered awareness of immediate physical environment 0  Impulsiveness 0  Lack of understanding of one's physical/cognitive limitations 0  Total Score 7  Patient's Fall Risk High Fall Risk (>13 points)  Adult Fall Risk Interventions  Required Bundle Interventions *See Row Information* High fall risk - low, moderate, and high requirements implemented  Screening for Fall Injury Risk  Risk For Fall Injury- See Row Information  None identified  Neurological  Neuro (WDL) WDL  Musculoskeletal  Musculoskeletal (WDL) WDL  Integumentary  Integumentary (WDL) WDL  Skin Color Appropriate for ethnicity  Skin Condition Dry  Skin Integrity Surgical Incision (see LDA)  Skin Turgor Non-tenting

## 2017-07-16 NOTE — Progress Notes (Signed)
Pt requested to use bathroom, tried to sit up on the bed and was still lightheaded and dizzy. Upon return to bed, BP normalized to 120/70. Placed on bedpan but pt was not able to void. Called Cathie BeamsFran Cresenzo-Dishmon, CNM to inform of episode, no new orders at this time.

## 2017-07-16 NOTE — Progress Notes (Signed)
Subjective: Postpartum Day 2: Cesarean Delivery Patient reports tolerating PO and no problems voiding.  Patient had syncopal epsidoe last night. Continues to feel dizzy.  Objective: Vital signs in last 24 hours: Temp:  [98.7 F (37.1 C)-99.7 F (37.6 C)] 98.7 F (37.1 C) (03/08 0322) Pulse Rate:  [91-99] 99 (03/08 0400) Resp:  [20] 20 (03/08 0400) BP: (84-140)/(33-91) 84/33 (03/08 0640) SpO2:  [100 %] 100 % (03/08 0322)  Physical Exam:  General: alert, cooperative and diaphoretic Lochia: appropriate Uterine Fundus: firm Incision: no significant drainage DVT Evaluation: No evidence of DVT seen on physical exam. No cords or calf tenderness.  Recent Labs    07/15/17 0602 07/16/17 0529  HGB 8.9* 6.5*  HCT 27.4* 20.4*    Assessment/Plan: Status post Cesarean section. Postoperative course complicated by hypotension and syncope  Continue current care. Reinsert IV and give fluid bolus CBC showing Hbg <7; patient gave verbal consent to get blood transfusion. Order placed Recheck on patient this PM Fall precautions; ambulate with assistance    Caryl AdaJazma Phelps, DO 07/16/2017, 7:33 AM

## 2017-07-16 NOTE — Progress Notes (Signed)
Pt reports feeling lightheaded, BP 84/33. Marylee FlorasFran Cres-dishmon, CNM called, 1,02400ml bolus ordered.

## 2017-07-16 NOTE — Progress Notes (Addendum)
Pt requested assist with getting to bathroom and started feeling dizziness and nausea. Pt started to slowly go down until she reached the floor on her knees. RN and mother of Pt witnessed the fall. Emergency call light activated and help was present immediately. BP 88/38, midwife called to bedside.

## 2017-07-17 ENCOUNTER — Inpatient Hospital Stay (HOSPITAL_COMMUNITY): Payer: Medicaid Other

## 2017-07-17 ENCOUNTER — Inpatient Hospital Stay (HOSPITAL_COMMUNITY): Payer: Medicaid Other | Admitting: Certified Registered Nurse Anesthetist

## 2017-07-17 ENCOUNTER — Encounter (HOSPITAL_COMMUNITY): Payer: Self-pay | Admitting: Radiology

## 2017-07-17 ENCOUNTER — Encounter (HOSPITAL_COMMUNITY)
Admission: AD | Disposition: A | Discharge status: Home / Self Care | Payer: Self-pay | Source: Ambulatory Visit | Attending: Family Medicine

## 2017-07-17 DIAGNOSIS — O902 Hematoma of obstetric wound: Secondary | ICD-10-CM | POA: Diagnosis not present

## 2017-07-17 HISTORY — PX: LAPAROTOMY: SHX154

## 2017-07-17 LAB — CBC
HEMATOCRIT: 18.9 % — AB (ref 36.0–46.0)
HEMATOCRIT: 24.2 % — AB (ref 36.0–46.0)
HEMOGLOBIN: 6.6 g/dL — AB (ref 12.0–15.0)
HEMOGLOBIN: 8.6 g/dL — AB (ref 12.0–15.0)
MCH: 28.9 pg (ref 26.0–34.0)
MCH: 29.5 pg (ref 26.0–34.0)
MCHC: 35.4 g/dL (ref 30.0–36.0)
MCHC: 35.5 g/dL (ref 30.0–36.0)
MCV: 81.5 fL (ref 78.0–100.0)
MCV: 82.9 fL (ref 78.0–100.0)
Platelets: 148 10*3/uL — ABNORMAL LOW (ref 150–400)
Platelets: 156 10*3/uL (ref 150–400)
RBC: 2.32 MIL/uL — AB (ref 3.87–5.11)
RBC: 2.92 MIL/uL — AB (ref 3.87–5.11)
RDW: 16.4 % — ABNORMAL HIGH (ref 11.5–15.5)
RDW: 16 % — ABNORMAL HIGH (ref 11.5–15.5)
WBC: 11.8 10*3/uL — AB (ref 4.0–10.5)
WBC: 12 10*3/uL — ABNORMAL HIGH (ref 4.0–10.5)

## 2017-07-17 LAB — PREPARE RBC (CROSSMATCH)

## 2017-07-17 SURGERY — LAPAROTOMY, EXPLORATORY
Anesthesia: General | Site: Abdomen

## 2017-07-17 MED ORDER — ACETAMINOPHEN 10 MG/ML IV SOLN
INTRAVENOUS | Status: DC | PRN
  Administered 2017-07-17: 1000 mg via INTRAVENOUS

## 2017-07-17 MED ORDER — PROPOFOL 10 MG/ML IV BOLUS
INTRAVENOUS | Status: AC
  Filled 2017-07-17: qty 20

## 2017-07-17 MED ORDER — SUGAMMADEX SODIUM 200 MG/2ML IV SOLN
INTRAVENOUS | Status: DC | PRN
  Administered 2017-07-17: 200 mg via INTRAVENOUS

## 2017-07-17 MED ORDER — PROPOFOL 10 MG/ML IV BOLUS
INTRAVENOUS | Status: DC | PRN
  Administered 2017-07-17: 180 mg via INTRAVENOUS

## 2017-07-17 MED ORDER — SUGAMMADEX SODIUM 200 MG/2ML IV SOLN
INTRAVENOUS | Status: AC
  Filled 2017-07-17: qty 2

## 2017-07-17 MED ORDER — IOPAMIDOL (ISOVUE-300) INJECTION 61%
100.0000 mL | Freq: Once | INTRAVENOUS | Status: AC | PRN
  Administered 2017-07-17: 100 mL via INTRAVENOUS

## 2017-07-17 MED ORDER — SCOPOLAMINE 1 MG/3DAYS TD PT72
MEDICATED_PATCH | TRANSDERMAL | Status: DC | PRN
  Administered 2017-07-17: 1 via TRANSDERMAL

## 2017-07-17 MED ORDER — SODIUM CHLORIDE 0.9 % IV SOLN
Freq: Once | INTRAVENOUS | Status: AC
  Administered 2017-07-17: 08:00:00 via INTRAVENOUS

## 2017-07-17 MED ORDER — CEFAZOLIN SODIUM-DEXTROSE 2-4 GM/100ML-% IV SOLN
INTRAVENOUS | Status: AC
  Filled 2017-07-17: qty 100

## 2017-07-17 MED ORDER — CEFAZOLIN SODIUM-DEXTROSE 2-3 GM-%(50ML) IV SOLR
INTRAVENOUS | Status: DC | PRN
  Administered 2017-07-17: 2 g via INTRAVENOUS

## 2017-07-17 MED ORDER — SCOPOLAMINE 1 MG/3DAYS TD PT72
MEDICATED_PATCH | TRANSDERMAL | Status: AC
  Filled 2017-07-17: qty 1

## 2017-07-17 MED ORDER — ACETAMINOPHEN 10 MG/ML IV SOLN
INTRAVENOUS | Status: AC
  Filled 2017-07-17: qty 100

## 2017-07-17 MED ORDER — LIDOCAINE HCL (CARDIAC) 20 MG/ML IV SOLN
INTRAVENOUS | Status: DC | PRN
  Administered 2017-07-17: 70 mg via INTRAVENOUS

## 2017-07-17 MED ORDER — ONDANSETRON HCL 4 MG/2ML IJ SOLN
INTRAMUSCULAR | Status: AC
  Filled 2017-07-17: qty 2

## 2017-07-17 MED ORDER — FENTANYL CITRATE (PF) 100 MCG/2ML IJ SOLN
INTRAMUSCULAR | Status: DC | PRN
  Administered 2017-07-17: 100 ug via INTRAVENOUS
  Administered 2017-07-17 (×2): 50 ug via INTRAVENOUS
  Administered 2017-07-17: 100 ug via INTRAVENOUS
  Administered 2017-07-17: 50 ug via INTRAVENOUS

## 2017-07-17 MED ORDER — HYDROMORPHONE HCL 1 MG/ML IJ SOLN
INTRAMUSCULAR | Status: DC | PRN
  Administered 2017-07-17: 1 mg via INTRAVENOUS

## 2017-07-17 MED ORDER — ROCURONIUM BROMIDE 100 MG/10ML IV SOLN
INTRAVENOUS | Status: AC
  Filled 2017-07-17: qty 1

## 2017-07-17 MED ORDER — BUPIVACAINE HCL (PF) 0.25 % IJ SOLN
INTRAMUSCULAR | Status: DC | PRN
Start: 2017-07-17 — End: 2017-07-17
  Administered 2017-07-17: 30 mL

## 2017-07-17 MED ORDER — HYDROMORPHONE HCL 1 MG/ML IJ SOLN
INTRAMUSCULAR | Status: AC
  Filled 2017-07-17: qty 1

## 2017-07-17 MED ORDER — ROCURONIUM BROMIDE 100 MG/10ML IV SOLN
INTRAVENOUS | Status: DC | PRN
  Administered 2017-07-17: 10 mg via INTRAVENOUS
  Administered 2017-07-17: 40 mg via INTRAVENOUS

## 2017-07-17 MED ORDER — MIDAZOLAM HCL 2 MG/2ML IJ SOLN
INTRAMUSCULAR | Status: DC | PRN
  Administered 2017-07-17: 2 mg via INTRAVENOUS

## 2017-07-17 MED ORDER — FENTANYL CITRATE (PF) 250 MCG/5ML IJ SOLN
INTRAMUSCULAR | Status: AC
  Filled 2017-07-17: qty 5

## 2017-07-17 MED ORDER — DEXAMETHASONE SODIUM PHOSPHATE 4 MG/ML IJ SOLN
INTRAMUSCULAR | Status: DC | PRN
Start: 2017-07-17 — End: 2017-07-17
  Administered 2017-07-17: 4 mg via INTRAVENOUS

## 2017-07-17 MED ORDER — MIDAZOLAM HCL 2 MG/2ML IJ SOLN
INTRAMUSCULAR | Status: AC
  Filled 2017-07-17: qty 2

## 2017-07-17 MED ORDER — DEXAMETHASONE SODIUM PHOSPHATE 4 MG/ML IJ SOLN
INTRAMUSCULAR | Status: AC
  Filled 2017-07-17: qty 1

## 2017-07-17 MED ORDER — MORPHINE SULFATE (PF) 4 MG/ML IV SOLN: 1.0000 mg | INTRAVENOUS | Status: DC | PRN

## 2017-07-17 MED ORDER — LIDOCAINE HCL (CARDIAC) 20 MG/ML IV SOLN
INTRAVENOUS | Status: AC
  Filled 2017-07-17: qty 5

## 2017-07-17 MED ORDER — MIDAZOLAM HCL 2 MG/2ML IJ SOLN: 0.5000 mg | Freq: Once | INTRAMUSCULAR | Status: DC | PRN

## 2017-07-17 MED ORDER — LACTATED RINGERS IV SOLN
INTRAVENOUS | Status: DC | PRN
  Administered 2017-07-17 (×3): via INTRAVENOUS

## 2017-07-17 MED ORDER — MEPERIDINE HCL 25 MG/ML IJ SOLN: 6.2500 mg | INTRAMUSCULAR | Status: DC | PRN

## 2017-07-17 MED ORDER — PROMETHAZINE HCL 25 MG/ML IJ SOLN: 6.2500 mg | INTRAMUSCULAR | Status: DC | PRN

## 2017-07-17 MED ORDER — ONDANSETRON HCL 4 MG/2ML IJ SOLN
INTRAMUSCULAR | Status: DC | PRN
  Administered 2017-07-17: 4 mg via INTRAVENOUS

## 2017-07-17 SURGICAL SUPPLY — 50 items
APL SKNCLS STERI-STRIP NONHPOA (GAUZE/BANDAGES/DRESSINGS) ×1
BARRIER ADHS 3X4 INTERCEED (GAUZE/BANDAGES/DRESSINGS) IMPLANT
BENZOIN TINCTURE PRP APPL 2/3 (GAUZE/BANDAGES/DRESSINGS) ×2 IMPLANT
BLADE SURG 10 STRL SS (BLADE) ×2 IMPLANT
BRR ADH 4X3 ABS CNTRL BYND (GAUZE/BANDAGES/DRESSINGS)
CANISTER SUCT 3000ML PPV (MISCELLANEOUS) ×3 IMPLANT
CELLS DAT CNTRL 66122 CELL SVR (MISCELLANEOUS) IMPLANT
CLOSURE WOUND 1/2 X4 (GAUZE/BANDAGES/DRESSINGS) ×2
CONT PATH 16OZ SNAP LID 3702 (MISCELLANEOUS) ×1 IMPLANT
DECANTER SPIKE VIAL GLASS SM (MISCELLANEOUS) IMPLANT
DRAPE WARM FLUID 44X44 (DRAPE) IMPLANT
DRSG OPSITE POSTOP 4X10 (GAUZE/BANDAGES/DRESSINGS) ×2 IMPLANT
DRSG TEGADERM 8X12 (GAUZE/BANDAGES/DRESSINGS) ×2 IMPLANT
DURAPREP 26ML APPLICATOR (WOUND CARE) ×6 IMPLANT
GAUZE SPONGE 4X4 16PLY XRAY LF (GAUZE/BANDAGES/DRESSINGS) ×2 IMPLANT
GLOVE BIOGEL PI IND STRL 7.0 (GLOVE) ×2 IMPLANT
GLOVE BIOGEL PI INDICATOR 7.0 (GLOVE) ×4
GLOVE ECLIPSE 7.0 STRL STRAW (GLOVE) ×6 IMPLANT
GOWN STRL REUS W/TWL LRG LVL3 (GOWN DISPOSABLE) ×9 IMPLANT
HEMOSTAT ARISTA ABSORB 3G PWDR (MISCELLANEOUS) ×2 IMPLANT
NEEDLE HYPO 22GX1.5 SAFETY (NEEDLE) ×3 IMPLANT
NS IRRIG 1000ML POUR BTL (IV SOLUTION) ×3 IMPLANT
PACK ABDOMINAL GYN (CUSTOM PROCEDURE TRAY) ×3 IMPLANT
PAD ABD 7.5X8 STRL (GAUZE/BANDAGES/DRESSINGS) ×8 IMPLANT
PAD ABD 8X7 1/2 STERILE (GAUZE/BANDAGES/DRESSINGS) ×4 IMPLANT
PAD OB MATERNITY 4.3X12.25 (PERSONAL CARE ITEMS) ×3 IMPLANT
PENCIL SMOKE EVAC W/HOLSTER (ELECTROSURGICAL) ×3 IMPLANT
PROTECTOR NERVE ULNAR (MISCELLANEOUS) ×3 IMPLANT
RETRACTOR WND ALEXIS 18 MED (MISCELLANEOUS) IMPLANT
RETRACTOR WND ALEXIS 25 LRG (MISCELLANEOUS) IMPLANT
RTRCTR WOUND ALEXIS 18CM MED (MISCELLANEOUS)
RTRCTR WOUND ALEXIS 25CM LRG (MISCELLANEOUS)
SPONGE LAP 18X18 X RAY DECT (DISPOSABLE) ×4 IMPLANT
STAPLER VISISTAT 35W (STAPLE) ×1 IMPLANT
STRIP CLOSURE SKIN 1/2X4 (GAUZE/BANDAGES/DRESSINGS) ×2 IMPLANT
SUT PLAIN 2 0 XLH (SUTURE) ×2 IMPLANT
SUT VIC AB 0 CT1 27 (SUTURE)
SUT VIC AB 0 CT1 27XBRD ANBCTR (SUTURE) IMPLANT
SUT VIC AB 1 CTX 36 (SUTURE) ×12
SUT VIC AB 1 CTX36XBRD ANBCTRL (SUTURE) ×2 IMPLANT
SUT VIC AB 2-0 SH 27 (SUTURE) ×6
SUT VIC AB 2-0 SH 27XBRD (SUTURE) IMPLANT
SUT VIC AB 3-0 SH 27 (SUTURE)
SUT VIC AB 3-0 SH 27X BRD (SUTURE) IMPLANT
SUT VIC AB 4-0 KS 27 (SUTURE) ×2 IMPLANT
SUT VICRYL 0 TIES 12 18 (SUTURE) IMPLANT
SYR CONTROL 10ML LL (SYRINGE) ×3 IMPLANT
TAPE CLOTH SURG 4X10 WHT LF (GAUZE/BANDAGES/DRESSINGS) ×2 IMPLANT
TOWEL OR 17X24 6PK STRL BLUE (TOWEL DISPOSABLE) ×6 IMPLANT
TRAY FOLEY CATH SILVER 14FR (SET/KITS/TRAYS/PACK) ×3 IMPLANT

## 2017-07-17 NOTE — Progress Notes (Signed)
Patient told RN she remembered after her fall on POD 1 she noticed a sharp pulling pain in her side when she got back to bed.  The pain subsided quickely and she had not thought about it until today.  She thinks she may have hit her abdomen when she fell but she is not sure.  She had 2 units of PRBC today and is not symptomatic of a low Hgb.  She is ambulating with no problems also.

## 2017-07-17 NOTE — Progress Notes (Signed)
Patient ID: Krystal Chan, female   DOB: 05/12/1985, 32 y.o.   MRN: [id number] Asked to see patient following results of CT. Patient is s/p CS 3 days ago and was initially stable on POD #1 but then had syncopal episode and significant drop in Hgb on POD #2. S/p 3 units PRBC's on yesterday, then Hgb dropped again to 6.6. S/p 2 more units PRBC's today. CT shows 24 x 14 x 8 anterior abdominal wall hematoma. Given continued dropping Hgb, size will take back to OR to remove and ensure hemostasis.

## 2017-07-17 NOTE — Progress Notes (Signed)
POSTPARTUM PROGRESS NOTE  POD#3  Subjective:  Krystal Chan is a 32 y.o. [id number] s/p rLTCS/BTL at 6236w2d.  Patient reports she is doing better this morning. Ambulated yesterday evening s/p 3 units of blood transfusion w/o lightheadedness of dizziness. Reports she is eating and drinking okay. Has been plassing flatus and had a BM. Pain is controlled with pain meds. Vaginal bleeding has decreased significantly  Objective: Patient Vitals for the past 24 hrs:  BP Temp Temp src Pulse Resp SpO2  07/17/17 0443 137/62 (!) 97.5 F (36.4 C) Oral 99 18 -  07/16/17 1815 115/80 99 F (37.2 C) Oral (!) 112 18 99 %  07/16/17 1620 137/84 99 F (37.2 C) Oral (!) 107 17 100 %  07/16/17 1422 124/85 98.4 F (36.9 C) Oral (!) 150 19 100 %  07/16/17 1407 128/82 98.3 F (36.8 C) Oral (!) 106 19 100 %  07/16/17 1315 130/85 98.8 F (37.1 C) Oral (!) 108 19 100 %  07/16/17 1130 131/64 98.1 F (36.7 C) Oral (!) 106 18 100 %  07/16/17 1046 135/66 98.3 F (36.8 C) Oral (!) 105 18 100 %  07/16/17 0855 131/74 97.9 F (36.6 C) Oral (!) 104 17 99 %  07/16/17 0815 124/73 98.9 F (37.2 C) Oral (!) 102 18 98 %  07/16/17 0740 124/74 - - (!) 108 - -     Physical Exam:  General: alert, cooperative and no distress Chest: no respiratory distress Heart:regular rate, distal pulses intact Abdomen: soft, morbidly obese, mild-moderate abdominal tenderness, most on left-mid abdomen Uterine Fundus: difficult to palpate d/t abdominal obesity DVT Evaluation: No calf swelling or tenderness Extremities: trace edema Skin: warm, dry; incision clean/dry/intact with honeycomb dressing in place  CBC Latest Ref Rng & Units 07/17/2017 07/16/2017 07/16/2017  WBC 4.0 - 10.5 K/uL 11.8(H) 13.9(H) 12.6(H)  Hemoglobin 12.0 - 15.0 g/dL 6.6(LL) 8.0(L) 6.5(LL)  Hematocrit 36.0 - 46.0 % 18.9(L) 23.0(L) 20.4(L)  Platelets 150 - 400 K/uL 148(L) 165 201     Assessment/Plan: Krystal Chan is a 32 y.o. [id number] s/p RLTCS/BTL at 6436w2d.  Post-op  course complicated by syncopal episode yesterday morning. Hgb down to 6.5, s/p 3-units PRBCs transfused. Pt is clinically improved, VSS, post-transfusion Hgb 8.0, but this morning, Hgb down to 6.6 again. Hgb value verified with lab.   --T&C 4 units PRBCs, and transfuse 3 units --Abdomen/Pelvic CT with contractions ordered STAT --Monitor VS closely   LOS: 3 days   Kandra NicolasJulie P DegeleMD 07/17/2017, 7:21 AM

## 2017-07-17 NOTE — Progress Notes (Signed)
CRITICAL VALUE ALERT  Critical Value:  6.6  Date & Time Notied:  07/17/2017  Provider Notified: Degele md  Orders Received/Actions taken: no further orders given

## 2017-07-17 NOTE — Anesthesia Preprocedure Evaluation (Addendum)
Anesthesia Evaluation  Patient identified by MRN, date of birth, ID band Patient awake    Reviewed: Allergy & Precautions, NPO status , Patient's Chart, lab work & pertinent test results  History of Anesthesia Complications Negative for: history of anesthetic complications  Airway Mallampati: III  TM Distance: >3 FB Neck ROM: Full    Dental  (+) Dental Advisory Given   Pulmonary neg pulmonary ROS,    breath sounds clear to auscultation       Cardiovascular negative cardio ROS   Rhythm:Regular Rate:Normal     Neuro/Psych negative neurological ROS     GI/Hepatic negative GI ROS, Neg liver ROS,   Endo/Other  Morbid obesity  Renal/GU negative Renal ROS     Musculoskeletal   Abdominal (+) + obese,   Peds  Hematology  (+) Blood dyscrasia (Hb 8.6, plt 156k), anemia ,   Anesthesia Other Findings   Reproductive/Obstetrics S/p C-section 3d ago: abd wall hematoma (s/p 5u pRBCs)                            Anesthesia Physical Anesthesia Plan  ASA: II  Anesthesia Plan: General   Post-op Pain Management:    Induction: Intravenous  PONV Risk Score and Plan: 4 or greater and Scopolamine patch - Pre-op, Dexamethasone and Ondansetron  Airway Management Planned: Oral ETT and Video Laryngoscope Planned  Additional Equipment:   Intra-op Plan:   Post-operative Plan: Extubation in OR  Informed Consent: I have reviewed the patients History and Physical, chart, labs and discussed the procedure including the risks, benefits and alternatives for the proposed anesthesia with the patient or authorized representative who has indicated his/her understanding and acceptance.   Dental advisory given  Plan Discussed with: CRNA and Surgeon  Anesthesia Plan Comments: (Plan routine monitors, GETA)        Anesthesia Quick Evaluation

## 2017-07-17 NOTE — Anesthesia Procedure Notes (Signed)
Procedure Name: Intubation Date/Time: 07/17/2017 8:50 PM Performed by: Elbert Ewingshymer, Adiah Guereca S, CRNA Pre-anesthesia Checklist: Patient identified, Emergency Drugs available, Suction available, Patient being monitored and Timeout performed Patient Re-evaluated:Patient Re-evaluated prior to induction Oxygen Delivery Method: Circle system utilized Preoxygenation: Pre-oxygenation with 100% oxygen Induction Type: Cricoid Pressure applied and IV induction Ventilation: Mask ventilation without difficulty Laryngoscope Size: 3 and Glidescope (Lo pro 3 ) Grade View: Grade I Tube type: Oral Tube size: 7.0 mm Number of attempts: 1 Airway Equipment and Method: Stylet and Video-laryngoscopy (Positioned on blankets for optimal sniffing position.) Placement Confirmation: ETT inserted through vocal cords under direct vision,  positive ETCO2,  CO2 detector and breath sounds checked- equal and bilateral Secured at: 22 cm Tube secured with: Tape Dental Injury: Teeth and Oropharynx as per pre-operative assessment

## 2017-07-17 NOTE — Op Note (Signed)
Preoperative diagnosis: Large hematoma, continued blood loss  Pain postoperative diagnosis: Same  Procedure: Exploratory laparotomy, evacuation of hematoma  Surgeon: Tinnie GensPRATT,Trinidad Ingle S  Assistant: Rolm BookbinderAmber Moss, DO  Anesthesia:  Gen. endotracheal Mathis Faretube-Jackson, Carswell, MD  Findings: Large amount of clot on rectus. Several areas of oozing noted. Hysterotomy and tubal ligation sites appeared hemostatic  Estimated blood loss: 100 cc  Specimens: none  Reason for Procedure: Joice Loftsikita Eslick is a 32 y.o., [id number] who presents 3 days post PLTCS and BTL with large hematoma 24 x 14 x 11. She has received 5 u PRBCs and still has dropping Hgb.  Procedure: Patient was taken to the OR. Timeout was performed. The patient received 2 g of Ancef and SCDs were in place. The patient was prepped and draped in the usual sterile fashion. A Foley catheter was placed inside her bladder. A Pfannenstiel incision was opened with a knife. Fascia opened by cutting suture in the midline. The fascial incision was extented sharply laterally. Large amount of clot removed. Rectus inspected and areas of active oozing were cauterized.The peritoneal cavity was entered by cutting previous suture and hysterotomy and tubal sites inspected and were noted to be hemostatic.Multiple areas of peritoneum, rectus bilaterally were noted to be oozing electrocautery used in most areas. The rectus on the left had some deep oozing near corner of fascial incision and multiple figure of eights were used to try to achieve hemostasis. Clot and debris removed. Arista used on all surfaces. The fascia is closed with 0 Vicryl suture in a running fashion. The subcutaneous tissue closed with 0 Plain. Subcutaneous tissues injected with 30 cc of quarter percent Marcaine. The skin was closed with 3-0 vicryl on a keith needle. Benzoin, steri-stirps, Honeycomb, Tegaderm followed by a pressure dressing used to cover the incision. All instrument, needle and lap counts correct  x 2. The patient was awakened and taken to recovery room in stable condition.  Shelbie Proctoranya S PrattMD 07/17/2017 10:33 PM

## 2017-07-17 NOTE — Transfer of Care (Signed)
Immediate Anesthesia Transfer of Care Note  Patient: Krystal Chan  Procedure(s) Performed: Exploratory Laparottomy, Evacuation of Abdominal Wall Hematoma (N/A Abdomen)  Patient Location: PACU  Anesthesia Type:General  Level of Consciousness: awake, alert  and oriented  Airway & Oxygen Therapy: Patient Spontanous Breathing and Patient connected to nasal cannula oxygen  Post-op Assessment: Report given to RN and Post -op Vital signs reviewed and stable  Post vital signs: Reviewed and stable BP 147/93, HR 102, RR 20, SaO2 98%  Last Vitals:  Vitals:   07/17/17 1815 07/17/17 1929  BP: 128/64 133/72  Pulse: 96 91  Resp: 18 18  Temp: 37.1 C 36.8 C  SpO2: 100%     Last Pain:  Vitals:   07/17/17 1929  TempSrc: Oral  PainSc: 2       Patients Stated Pain Goal: 0 (07/14/17 0215)  Complications: No apparent anesthesia complications

## 2017-07-18 ENCOUNTER — Encounter (HOSPITAL_COMMUNITY): Payer: Self-pay | Admitting: Family Medicine

## 2017-07-18 LAB — BPAM RBC
BLOOD PRODUCT EXPIRATION DATE: 201903162359
BLOOD PRODUCT EXPIRATION DATE: 201903212359
Blood Product Expiration Date: 201903162359
Blood Product Expiration Date: 201903192359
Blood Product Expiration Date: 201903212359
Blood Product Expiration Date: 201903232359
Blood Product Expiration Date: 201903272359
ISSUE DATE / TIME: 201903080815
ISSUE DATE / TIME: 201903081057
ISSUE DATE / TIME: 201903081350
ISSUE DATE / TIME: 201903090803
ISSUE DATE / TIME: 201903091232
UNIT TYPE AND RH: 600
UNIT TYPE AND RH: 9500
UNIT TYPE AND RH: 9500
Unit Type and Rh: 600
Unit Type and Rh: 6200
Unit Type and Rh: 6200
Unit Type and Rh: 9500

## 2017-07-18 LAB — TYPE AND SCREEN
ABO/RH(D): A POS
Antibody Screen: NEGATIVE
UNIT DIVISION: 0
UNIT DIVISION: 0
Unit division: 0
Unit division: 0
Unit division: 0
Unit division: 0
Unit division: 0

## 2017-07-18 LAB — CBC
HEMATOCRIT: 23 % — AB (ref 36.0–46.0)
HEMOGLOBIN: 7.8 g/dL — AB (ref 12.0–15.0)
MCH: 28.9 pg (ref 26.0–34.0)
MCHC: 33.9 g/dL (ref 30.0–36.0)
MCV: 85.2 fL (ref 78.0–100.0)
Platelets: 156 10*3/uL (ref 150–400)
RBC: 2.7 MIL/uL — AB (ref 3.87–5.11)
RDW: 16.7 % — ABNORMAL HIGH (ref 11.5–15.5)
WBC: 10.3 10*3/uL (ref 4.0–10.5)

## 2017-07-18 NOTE — Anesthesia Postprocedure Evaluation (Signed)
Anesthesia Post Note  Patient: Krystal Chan  Procedure(s) Performed: Exploratory Laparottomy, Evacuation of Abdominal Wall Hematoma (N/A Abdomen)     Patient location during evaluation: PACU Anesthesia Type: General Level of consciousness: awake and alert, patient cooperative and oriented Pain management: pain level controlled Vital Signs Assessment: post-procedure vital signs reviewed and stable Respiratory status: spontaneous breathing, nonlabored ventilation and respiratory function stable Cardiovascular status: blood pressure returned to baseline and stable Postop Assessment: no apparent nausea or vomiting Anesthetic complications: no    Last Vitals:  Vitals:   07/18/17 0110 07/18/17 0311  BP: 134/75   Pulse: 95   Resp: 18   Temp: 36.7 C   SpO2: 95% 96%    Last Pain:  Vitals:   07/18/17 0311  TempSrc:   PainSc: 3    Pain Goal: Patients Stated Pain Goal: 0 (07/14/17 0215)               Erling CruzJACKSON,E. Xavia Kniskern

## 2017-07-18 NOTE — Progress Notes (Signed)
POSTPARTUM PROGRESS NOTE  Post Op Day 4 pLTCS/BTL and POD#1 laparotomy and evacuation of abdominal wall hematoma Subjective:  Krystal Chan is a 32 y.o. [id number] 3649w2d s/p pLTCS/BTL and laparatomy and evacuation of abdominal wall hematoma.  Patient is feeling well, no complaints.   Objective: Blood pressure 122/75, pulse 86, temperature 97.9 F (36.6 C), temperature source Oral, resp. rate 18, last menstrual period 11/09/2016, SpO2 96 %, unknown if currently breastfeeding.  Physical Exam:  General: alert, cooperative and no distress Lochia:normal flow Chest: no respiratory distress Heart:regular rate, distal pulses intact Abdomen: soft, nontender,  Uterine Fundus: firm, appropriately tender DVT Evaluation: No calf swelling or tenderness Extremities: no edema Incision: pressure dressing applied  Recent Labs    07/17/17 1639 07/18/17 0547  HGB 8.6* 7.8*  HCT 24.2* 23.0*    Assessment/Plan:  ASSESSMENT: Krystal Chan is a 32 y.o. [id number] 2749w2d s/p pLTCS/BTL and laparatomy and evacuation of abdominal wall   CBC this am shows Hgb 7.8. Patient is feeling well but has not been out of bed yet. Monitor today for dizziness or other symptoms. Continue oral iron. Discharge likely tomorrow but would consider this evening if asymptomatic.   LOS: 4 days   Rosalia Mcavoy MossDO 07/18/2017, 7:17 AM

## 2017-07-19 ENCOUNTER — Encounter (HOSPITAL_COMMUNITY): Payer: Self-pay

## 2017-07-19 LAB — CBC
HCT: 24.9 % — ABNORMAL LOW (ref 36.0–46.0)
HEMOGLOBIN: 8.1 g/dL — AB (ref 12.0–15.0)
MCH: 28 pg (ref 26.0–34.0)
MCHC: 32.5 g/dL (ref 30.0–36.0)
MCV: 86.2 fL (ref 78.0–100.0)
PLATELETS: 195 10*3/uL (ref 150–400)
RBC: 2.89 MIL/uL — AB (ref 3.87–5.11)
RDW: 17.1 % — ABNORMAL HIGH (ref 11.5–15.5)
WBC: 11.9 10*3/uL — AB (ref 4.0–10.5)

## 2017-07-19 MED ORDER — SENNOSIDES-DOCUSATE SODIUM 8.6-50 MG PO TABS: 2.0000 | ORAL_TABLET | Freq: Every day | ORAL | 0 refills | Status: DC

## 2017-07-19 MED ORDER — IBUPROFEN 600 MG PO TABS: 600.0000 mg | ORAL_TABLET | Freq: Four times a day (QID) | ORAL | 0 refills | Status: DC

## 2017-07-19 MED ORDER — OXYCODONE-ACETAMINOPHEN 5-325 MG PO TABS: 1.0000 | ORAL_TABLET | Freq: Four times a day (QID) | ORAL | 0 refills | Status: DC | PRN

## 2017-07-19 NOTE — Discharge Summary (Addendum)
OB Discharge Summary     Patient Name: Krystal Chan DOB: 11/02/1985 MRN: [id number]  Date of admission: 07/14/2017 Delivering MD:    Tyler AasCook, GirlA Cameka [[id number]]  Montez HagemanSTINSON, JACOB J   Schmall, GirlB ZuehlNikita [[id number]][[id number]]  Candelaria CelesteSTINSON, JACOB J   Date of discharge: 07/19/2017  Admitting diagnosis: 35WKS WATER BROKE Intrauterine pregnancy: 2365w2d     Secondary diagnosis:  Principal Problem:   S/P C-section Active Problems:   Supervision of high risk pregnancy, antepartum   History of preterm delivery, currently pregnant   Dichorionic diamniotic twin pregnancy, antepartum   Unwanted fertility   Acute blood loss anemia   Gestational hypertension  Additional problems: Post-op hematoma     Discharge diagnosis: Preterm Twin Pregnancy Delivered, Gestational Hypertension and Anemia                                                                                                Post partum procedures:blood transfusion S/p 5U pRBC, Exploratory laparotomy, postpartum tubal ligation  Augmentation: None  Complications: Syncopal episode with fall that possibly led to abdominal hematoma  Hospital course:  Sceduled C/S   32 y.o. yo G6P2225 at 4865w2d was admitted to the hospital 07/14/2017 for scheduled cesarean section with the following indication:Malpresentation of twin A.  Membrane Rupture Time/Date:    Tyler AasCook, GirlA Shanikwa [[id number]]  1:30 AM   Leticia ClasCook, GirlB DoraNikita [[id number]][[id number]]  1:30 AM ,   Tyler AasCook, GirlA Devina [[id number]][[id number]]  07/14/2017   Eligha BridegroomCook, GirlB Markelle [[id number]][[id number]]  07/14/2017   Patient delivered a Viable infant.   Tyler AasCook, GirlA Aleigha [[id number]][[id number]]  07/14/2017   Eligha BridegroomCook, GirlB Mollye [[id number]][[id number]]  07/14/2017  Details of operation can be found in separate operative note.    Pateint had a complicated postpartum course. Less than 24hrs postoperatively patient had a syncopal episode for which she fell. Blood work showed patient to have symptomatic anemia. She was given 3U pRBC. Started having abdominal  pain and tenderness. CT abdomen performed which showed large hematoma. Patient went for exploratory laparotomy with evacuation of hematoma. She then received an additional 2U of pRBCs. S/p procedure patient is much improved. She is ambulating, tolerating a regular diet, passing flatus, and urinating well. Patient is discharged home in stable condition on  07/19/17        Physical exam  Vitals:   07/18/17 1819 07/18/17 2100 07/19/17 0100 07/19/17 0500  BP: 131/76 127/67 137/69 134/69  Pulse:  90 91 80  Resp: 18 18 16 16   Temp: 99.3 F (37.4 C) 99.3 F (37.4 C) 99.7 F (37.6 C) 98.9 F (37.2 C)  TempSrc: Oral Oral Oral Oral  SpO2:  98% 99%    General: alert, cooperative and no distress Lochia: appropriate Uterine Fundus: firm Incision: Dressing is clean, dry, and intact DVT Evaluation: No evidence of DVT seen on physical exam. No significant calf/ankle edema. Labs: Lab Results  Component Value Date   WBC 11.9 (H) 07/19/2017   HGB 8.1 (L) 07/19/2017   HCT 24.9 (L) 07/19/2017   MCV 86.2 07/19/2017   PLT 195 07/19/2017   CMP Latest Ref Rng & Units 07/14/2017  Glucose 65 - 99 mg/dL 95  BUN 6 - 20 mg/dL 5(L)  Creatinine 1.61 - 1.00 mg/dL 0.96(E)  Sodium 454 - 098 mmol/L 134(L)  Potassium 3.5 - 5.1 mmol/L 3.5  Chloride 101 - 111 mmol/L 104  CO2 22 - 32 mmol/L 20(L)  Calcium 8.9 - 10.3 mg/dL 1.1(B)  Total Protein 6.5 - 8.1 g/dL 6.8  Total Bilirubin 0.3 - 1.2 mg/dL 0.6  Alkaline Phos 38 - 126 U/L 117  AST 15 - 41 U/L 20  ALT 14 - 54 U/L 13(L)    Discharge instruction: per After Visit Summary and "Baby and Me Booklet".  After visit meds:  Allergies as of 07/19/2017   No Known Allergies     Medication List    STOP taking these medications   FUSION PLUS Caps     TAKE these medications   CONCEPT OB 130-92.4-1 MG Caps Take 1 tablet by mouth daily.   ferrous sulfate 325 (65 FE) MG tablet Take 325 mg by mouth daily with breakfast.   ibuprofen 600 MG tablet Commonly  known as:  ADVIL,MOTRIN Take 1 tablet (600 mg total) by mouth every 6 (six) hours.   oxyCODONE-acetaminophen 5-325 MG tablet Commonly known as:  PERCOCET/ROXICET Take 1 tablet by mouth every 6 (six) hours as needed for severe pain.   senna-docusate 8.6-50 MG tablet Commonly known as:  Senokot-S Take 2 tablets by mouth at bedtime.       Diet: routine diet  Activity: Advance as tolerated. Pelvic rest for 6 weeks.   Outpatient follow up:4 weeks Follow up Appt: Future Appointments  Date Time Provider Department Center  08/27/2017 10:55 AM Rolm Bookbinder, DO [facility]   Follow up Visit:No Follow-up on file.  Postpartum contraception: PP BTL  Newborn Data:   Batya, Citron [[id number]]  Live born female  Birth Weight: 5 lb 14.5 oz (2680 g) APGAR: 7, 9  Newborn Delivery   Birth date/time:  07/14/2017 05:01:00 Delivery type:  C-Section, Low Transverse C-section categorization:  Primary      Kyrah, Schiro [[id number]]  Live born female  Birth Weight: 5 lb 15.8 oz (2715 g) APGAR: 8, 8  Newborn Delivery   Birth date/time:  07/14/2017 05:04:00 Delivery type:  C-Section, Low Transverse C-section categorization:  Primary     Baby Feeding: Breast Disposition:home with mother   07/19/2017 Caryl Ada, DO

## 2017-07-20 ENCOUNTER — Ambulatory Visit (HOSPITAL_COMMUNITY): Payer: Medicaid Other

## 2017-07-21 ENCOUNTER — Ambulatory Visit: Payer: Medicaid Other

## 2017-07-22 ENCOUNTER — Encounter: Payer: Medicaid Other | Admitting: Obstetrics and Gynecology

## 2017-07-27 ENCOUNTER — Ambulatory Visit (HOSPITAL_COMMUNITY): Payer: Medicaid Other

## 2017-07-28 ENCOUNTER — Encounter: Payer: Medicaid Other | Admitting: Obstetrics & Gynecology

## 2017-07-28 ENCOUNTER — Ambulatory Visit: Payer: Medicaid Other

## 2017-07-29 ENCOUNTER — Ambulatory Visit: Payer: Medicaid Other

## 2017-08-16 ENCOUNTER — Encounter: Payer: Self-pay | Admitting: Family Medicine

## 2017-08-25 ENCOUNTER — Ambulatory Visit: Payer: Medicaid Other | Admitting: Family Medicine

## 2017-08-27 ENCOUNTER — Ambulatory Visit: Payer: Medicaid Other | Admitting: Family Medicine

## 2017-09-29 ENCOUNTER — Encounter: Payer: Self-pay | Admitting: Student

## 2017-09-29 ENCOUNTER — Ambulatory Visit (INDEPENDENT_AMBULATORY_CARE_PROVIDER_SITE_OTHER): Payer: Medicaid Other | Admitting: Student

## 2017-09-29 DIAGNOSIS — M6208 Separation of muscle (nontraumatic), other site: Secondary | ICD-10-CM

## 2017-09-29 DIAGNOSIS — Z1389 Encounter for screening for other disorder: Secondary | ICD-10-CM | POA: Diagnosis not present

## 2017-09-29 DIAGNOSIS — O165 Unspecified maternal hypertension, complicating the puerperium: Secondary | ICD-10-CM

## 2017-09-29 NOTE — Progress Notes (Signed)
Subjective:     Krystal Chan is a 32 y.o. female who presents for a postpartum visit. She is 10 weeks postpartum following a low cervical transverse Cesarean section. I have fully reviewed the prenatal and intrapartum course. The delivery was at [redacted]w[redacted]d. Outcome: primary cesarean section, low transverse incision. Anesthesia: general. Postpartum course has been complicated by PP hematoma & that had to be surgically evacuated . Baby's course has been normal. Baby is feeding by bottle - Similac Neosure. Bleeding none. Bowel function is normal. Bladder function is normal. Patient is sexually active. Contraception method is tubal ligation. Postpartum depression screening: negative.  The following portions of the patient's history were reviewed and updated as appropriate: allergies, current medications, past family history, past medical history, past social history, past surgical history and problem list.  Review of Systems Pertinent items are noted in HPI.   Objective:    BP (!) 142/84   Pulse 67   Ht  (1.575 m)   Wt 209 lb 12.8 oz (95.2 kg)   Breastfeeding? No   BMI 38.37 kg/m   General:  alert and cooperative  Lungs: clear to auscultation bilaterally  Heart:  regular rate and rhythm, S1, S2 normal, no murmur, click, rub or gallop  Abdomen: normal findings: bowel sounds normal, soft, non-tender, umbilicus normal and c/section wound well healed and abnormal findings:  diastasis recti   Assessment:  Pregnancy complicated by gestational hypertension. Denies HTN outside of pregnancy. Now 10 wks PP BPs are elevated. Will refer to PCP. No meds started today per [facility].   Had normal pap smear last year, next due in 2021 Plan:   1. Encounter for routine postpartum follow-up -doing well-- discussed next pap smear due in 2021  2. Postpartum hypertension -referral to PCP to follow for consideration of CHTN  3. Diastasis recti -Educational materials & exercises provided  Judeth Horn, NP

## 2017-09-29 NOTE — Progress Notes (Signed)
Referral to [facility] made. They will call patient with appt. Instructed patient to call us if she does not hear from them.

## 2017-09-29 NOTE — Addendum Note (Signed)
Addended by: Gerome Apley on: 09/29/2017 03:25 PM   Modules accepted: Orders

## 2017-09-29 NOTE — Patient Instructions (Addendum)
Diastasis Recti Diastasis recti is when the muscles of the abdomen (rectus abdominis muscles) become thin and separate. The result is a wider space between the right and left abdomen (abdominal) muscles. This wider space between the muscles may cause a bulge in the middle of your abdomen. You may notice this bulge when you are straining or when you sit up from a lying down position. Diastasis recti can affect men and women. It is most common among pregnant women, infants, people who are obese, and people who have had abdominal surgery. Exercise or surgical treatment may help correct it. What are the causes? Common causes of this condition include:  Pregnancy. The growing uterus puts pressure on the abdominal muscles, which causes the muscles to separate.  Obesity. Excess fat puts pressure on abdominal muscles.  Weightlifting.  Some abdomen exercises.  Advanced age.  Genetics.  Prior abdominal surgery.  What increases the risk? This condition is more likely to develop in:  Women.  Newborns, especially newborns who are born early (prematurely).  What are the signs or symptoms? Common symptoms of this condition include:  A bulge in the middle of the abdomen. You will notice it most when you sit up or strain.  Pain in the low back, pelvis, or hips.  Constipation.  Inability to control when you urinate (urinary incontinence).  Bloating.  Poor posture.  How is this diagnosed? This condition is diagnosed with a physical exam. Your health care provider will ask you to lie flat on your back and do a crunch or half sit-up. If you have diastasis recti, a vertical bulge will appear between your abdominal muscles in the center of your abdomen. Your health care provider will measure the gap between your muscles with one of the following:  A medical device used to measure the space between two objects (caliper).  A tape measure.  CT scan.  Ultrasound.  Finger spaces. Your health  care provider will measure the space using their fingers.  How is this treated? If your muscle separation is not too large, you may not need treatment. However, if you are a woman who plans to become pregnant again, you should treat this condition before your next pregnancy. Treatment may include:  Physical therapy to strengthen and tighten your abdominal muscles.  Lifestyle changes such as weight loss and exercise.  Over-the-counter pain medicines as needed.  Surgery to correct the separation.  Follow these instructions at home: Activity  Return to your normal activities as told by your health care provider. Ask your health care provider what activities are safe for you.  When lifting weights or doing exercises using your abdominal muscles or the muscles in the center of your body that give stability (core muscles), make sure you are doing your exercises and movements correctly. Proper form can help to prevent the condition from happening again. General instructions  If you are overweight, ask your health care provider for help with weight loss. Losing even a small amount of weight can help to improve your diastasis recti.  Take over-the-counter or prescription medicines only as told by your health care provider.  Do not strain. Straining can make the separation worse. Examples of straining include: ? Pushing hard to have a bowel movement, such as due to constipation. ? Lifting heavy objects, including children. ? Standing up and sitting down.  Take steps to prevent constipation: ? Drink enough fluid to keep your urine clear or pale yellow. ? Take over-the-counter or prescription medicines only as  directed. ? Eat foods that are high in fiber, such as fresh fruits and vegetables, whole grains, and beans. ? Limit foods that are high in fat and processed sugars, such as fried and sweet foods. Contact a health care provider if:  You notice a new bulge in your abdomen. Get help  right away if:  You experience severe discomfort in your abdomen.  You develop severe abdominal pain along with nausea, vomiting, or fever. Summary  Diastasis recti is when the abdomen (abdominal) muscles become thin and separate. Your abdomen will stick out because the space between your right and left abdomen muscles has widened.  The most common symptom is a bulge in your abdomen. You will notice it most when you sit up or are straining.  This condition is diagnosed during a physical exam.  If the abdomen separation is not too big, you may choose not to have treatment. Otherwise, you may need to undergo physical therapy or surgery. This information is not intended to replace advice given to you by your health care provider. Make sure you discuss any questions you have with your health care provider. Document Released: 06/22/2016 Document Revised: 06/22/2016 Document Reviewed: 06/22/2016 Elsevier Interactive Patient Education  [employer].      1. Abdominal Drawing In When getting in and out of bed, lifting or carrying your baby or getting up from a chair, pull in your stomach as if you were trying to fit into a tight pair of jeans. You should be able to hold this abdominal muscle contraction and breathe normally for 10 seconds. This exercise will safely start to engage and strengthen your abdominal muscles without compromising them, Mackey Birchwood says. 2. Belly Breathing Lie flat on your back on a mat. Take a deep inhale, allowing your belly and then your chest to fully inflate. Then, slowly and forcefully exhale, drawing your abdominal wall in as if you had a corset on as you do so, says certified strength and conditioning specialist Randye Lobo, co-founder of [employer] in [place]. 3. Heel Slides With Alternating Arms Lie flat on your back on a mat, with your knees bent and feet flat on the floor. Straighten your arms and raise them directly over your shoulders. Exhale,  and slowly extend one leg out in front of you, letting it hover a few inches above the floor, and simultaneously extend the opposite arm back above the head, just off of the floor. Inhale and slowly return to start. Repeat on the opposite side. Work to keep your hips and core stable through the entire movement, Mundell says. 4. Quadruped Abdominal Get on all fours (hands under your shoulders and knees under hips) and pull your shoulders wide and away from your ears to form a flat back. From here, take a slow, deep inhale, allowing your abdominal wall to relax and expand toward the floor. Then exhale, drawing the muscles up and in while maintaining a flat back, Fransico Michael says.

## 2017-10-05 ENCOUNTER — Ambulatory Visit: Payer: Medicaid Other | Admitting: Family Medicine

## 2017-10-08 ENCOUNTER — Ambulatory Visit: Payer: Medicaid Other | Admitting: Family Medicine

## 2017-10-27 ENCOUNTER — Ambulatory Visit: Payer: Medicaid Other | Admitting: Family Medicine

## 2017-12-30 ENCOUNTER — Other Ambulatory Visit: Payer: Self-pay

## 2017-12-30 ENCOUNTER — Encounter: Payer: Self-pay | Admitting: Family Medicine

## 2017-12-30 ENCOUNTER — Ambulatory Visit: Payer: Medicaid Other | Attending: Family Medicine | Admitting: Family Medicine

## 2017-12-30 VITALS — BP 159/96 | HR 72 | Temp 100.1°F | Resp 18 | Ht 61.0 in | Wt 214.0 lb

## 2017-12-30 DIAGNOSIS — D649 Anemia, unspecified: Secondary | ICD-10-CM | POA: Insufficient documentation

## 2017-12-30 DIAGNOSIS — I10 Essential (primary) hypertension: Secondary | ICD-10-CM | POA: Diagnosis not present

## 2017-12-30 DIAGNOSIS — Z803 Family history of malignant neoplasm of breast: Secondary | ICD-10-CM | POA: Diagnosis not present

## 2017-12-30 DIAGNOSIS — Z823 Family history of stroke: Secondary | ICD-10-CM | POA: Insufficient documentation

## 2017-12-30 MED ORDER — AMLODIPINE BESYLATE 5 MG PO TABS: 5.0000 mg | ORAL_TABLET | Freq: Every day | ORAL | 6 refills | Status: DC

## 2017-12-30 NOTE — Progress Notes (Signed)
Subjective:    Patient ID: Krystal Chan, female    DOB: 12/09/1985, 32 y.o.   MRN: [id number]  HPI 32 yo female new to the practice to establish care. Patient reports elevated blood pressure during her recent pregnancy. Patient with 615 month old twin girls in addition to a 34 year old daughter, 34 year old son and a 557 year old daughter who is entering her senior year of high school. Patient is currently on no medications except for prenatal MVI. Patient with a family history of MGM and mother with hypertension and her mother has had a stroke. Patient's maternal grandmother has also had breast cancer. Patient states that she feels well and was not aware that her blood pressure was elevated. Patient denies any headache or dizziness, no chest pain or palpitations, no shortness of breath and no peripheral edema. Past medical history of anemia and patient required transfusions during recent hospitalization when her twins were born. Patient has otherwise been healthy. She does not smoke, does have occasional alcohol on social occasions. Patient works at [employer].  Past Medical History:  Diagnosis Date  . History of preterm delivery, currently pregnant 02/28/2015   [ ] 17-P  . Umbilical hernia    repaired ~2004   Past Surgical History:  Procedure Laterality Date  . CESAREAN SECTION MULTI-GESTATIONAL N/A 07/14/2017   Procedure: CESAREAN SECTION MULTI-GESTATIONAL;  Surgeon: Levie HeritageStinson, Jacob J, DO;  Location: [facility];  Service: Obstetrics;  Laterality: N/A;  . HERNIA REPAIR     gastic hernia 2005 and umbilical hernia 2004  . LAPAROTOMY N/A 07/17/2017   Procedure: Exploratory Laparottomy, Evacuation of Abdominal Wall Hematoma;  Surgeon: Reva BoresPratt, Tanya S, MD;  Location: [facility];  Service: Gynecology;  Laterality: N/A;   Family History  Problem Relation Age of Onset  . Breast cancer Maternal Grandmother   . Hypertension Maternal Grandmother   . Cancer Maternal Grandmother        breast  . Hypertension  Mother   . Heart disease Mother   . Hypertension Paternal Grandmother   . Diabetes Paternal Grandmother   . Diabetes Father   . Hearing loss Neg Hx   No Known Allergies  Review of Systems  Constitutional: Positive for fatigue. Negative for chills and fever.  HENT: Negative for hearing loss and trouble swallowing.   Respiratory: Negative for cough and shortness of breath.   Cardiovascular: Negative for chest pain, palpitations and leg swelling.  Gastrointestinal: Negative for abdominal pain and nausea.  Endocrine: Negative for polydipsia, polyphagia and polyuria.  Genitourinary: Positive for frequency. Negative for difficulty urinating and dysuria.  Neurological: Negative for dizziness and headaches.       Objective:   Physical Exam  Constitutional: She is oriented to person, place, and time. She appears well-developed and well-nourished. No distress.  HENT:  Right Ear: Tympanic membrane, external ear and ear canal normal.  Left Ear: Tympanic membrane, external ear and ear canal normal.  Mouth/Throat: Oropharynx is clear and moist.  Eyes: Conjunctivae and EOM are normal.  Neck: Normal range of motion. Neck supple. No thyromegaly present.  Cardiovascular: Normal rate and regular rhythm.  PMI is displaced  Pulmonary/Chest: Effort normal and breath sounds normal.  Abdominal: Soft. There is no tenderness.  Musculoskeletal: Normal range of motion. She exhibits no edema.  Neurological: She is alert and oriented to person, place, and time. No cranial nerve deficit.  Vitals reviewed.  BP (!) 159/96 (BP Location: Right Arm, Cuff Size: Normal)   Pulse 72  Temp 100.1 F (37.8 C) (Oral)   Resp 18   Ht 5\' 1"  (1.549 m)   Wt 214 lb (97.1 kg)   SpO2 99%   BMI 40.43 kg/m      Assessment & Plan:  1. Essential hypertension Patient's blood pressure is elevated at today's visit and has been elevated during her pregnancy as well as at her postpartum follow-up.  Patient will be started on  amlodipine 5 mg once daily for treatment of blood pressure.  On examination, patient does have a displaced PMI and patient will be referred to cardiology for further evaluation to see if she may also have some post pregnancy cardiomyopathy.  Patient will have urinalysis, BMP and lipid panel in follow-up of her hypertension.  Patient will be notified of the results and if any further treatment/evaluation or medications are needed based on these results.  Patient will return to clinic for blood pressure recheck with clinical pharmacist in approximately 2 weeks and will follow-up in the office in 3 months if blood pressure is normal. - Basic Metabolic Panel - POCT URINALYSIS DIP (CLINITEK) - Lipid panel - Ambulatory referral to Cardiology - amLODipine (NORVASC) 5 MG tablet; Take 1 tablet (5 mg total) by mouth daily.  Dispense: 30 tablet; Refill: 6  2. Anemia, unspecified type Patient with anemia during her hospitalization requiring transfusion and will recheck CBC. Patient will be notified if she will need to resume use of iron therapy. - CBC with Differential    An After Visit Summary was printed and given to the patient.  Return in about 3 months (around 04/01/2018) for HTN-2 weeks with Luke/CPP; 3 mos with PCP.

## 2017-12-30 NOTE — Patient Instructions (Signed)
Hypertension Hypertension is another name for high blood pressure. High blood pressure forces your heart to work harder to pump blood. This can cause problems over time. There are two numbers in a blood pressure reading. There is a top number (systolic) over a bottom number (diastolic). It is best to have a blood pressure below 120/80. Healthy choices can help lower your blood pressure. You may need medicine to help lower your blood pressure if:  Your blood pressure cannot be lowered with healthy choices.  Your blood pressure is higher than 130/80.  Follow these instructions at home: Eating and drinking  If directed, follow the DASH eating plan. This diet includes: ? Filling half of your plate at each meal with fruits and vegetables. ? Filling one quarter of your plate at each meal with whole grains. Whole grains include whole wheat pasta, brown rice, and whole grain bread. ? Eating or drinking low-fat dairy products, such as skim milk or low-fat yogurt. ? Filling one quarter of your plate at each meal with low-fat (lean) proteins. Low-fat proteins include fish, skinless chicken, eggs, beans, and tofu. ? Avoiding fatty meat, cured and processed meat, or chicken with skin. ? Avoiding premade or processed food.  Eat less than 1,500 mg of salt (sodium) a day.  Limit alcohol use to no more than 1 drink a day for nonpregnant women and 2 drinks a day for men. One drink equals 12 oz of beer, 5 oz of wine, or 1 oz of hard liquor. Lifestyle  Work with your doctor to stay at a healthy weight or to lose weight. Ask your doctor what the best weight is for you.  Get at least 30 minutes of exercise that causes your heart to beat faster (aerobic exercise) most days of the week. This may include walking, swimming, or biking.  Get at least 30 minutes of exercise that strengthens your muscles (resistance exercise) at least 3 days a week. This may include lifting weights or pilates.  Do not use any  products that contain nicotine or tobacco. This includes cigarettes and e-cigarettes. If you need help quitting, ask your doctor.  Check your blood pressure at home as told by your doctor.  Keep all follow-up visits as told by your doctor. This is important. Medicines  Take over-the-counter and prescription medicines only as told by your doctor. Follow directions carefully.  Do not skip doses of blood pressure medicine. The medicine does not work as well if you skip doses. Skipping doses also puts you at risk for problems.  Ask your doctor about side effects or reactions to medicines that you should watch for. Contact a doctor if:  You think you are having a reaction to the medicine you are taking.  You have headaches that keep coming back (recurring).  You feel dizzy.  You have swelling in your ankles.  You have trouble with your vision. Get help right away if:  You get a very bad headache.  You start to feel confused.  You feel weak or numb.  You feel faint.  You get very bad pain in your: ? Chest. ? Belly (abdomen).  You throw up (vomit) more than once.  You have trouble breathing. Summary  Hypertension is another name for high blood pressure.  Making healthy choices can help lower blood pressure. If your blood pressure cannot be controlled with healthy choices, you may need to take medicine. This information is not intended to replace advice given to you by your health care   provider. Make sure you discuss any questions you have with your health care provider. Document Released: 10/14/2007 Document Revised: 03/25/2016 Document Reviewed: 03/25/2016 Elsevier Interactive Patient Education  2018 Else[employer]  DASH Eating Plan DASH stands for "Dietary Approaches to Stop Hypertension." The DASH eating plan is a healthy eating plan that has been shown to reduce high blood pressure (hypertension). It may also reduce your risk for type 2 diabetes, heart disease, and  stroke. The DASH eating plan may also help with weight loss. What are tips for following this plan? General guidelines  Avoid eating more than 2,300 mg (milligrams) of salt (sodium) a day. If you have hypertension, you may need to reduce your sodium intake to 1,500 mg a day.  Limit alcohol intake to no more than 1 drink a day for nonpregnant women and 2 drinks a day for men. One drink equals 12 oz of beer, 5 oz of wine, or 1 oz of hard liquor.  Work with your health care provider to maintain a healthy body weight or to lose weight. Ask what an ideal weight is for you.  Get at least 30 minutes of exercise that causes your heart to beat faster (aerobic exercise) most days of the week. Activities may include walking, swimming, or biking.  Work with your health care provider or diet and nutrition specialist (dietitian) to adjust your eating plan to your individual calorie needs. Reading food labels  Check food labels for the amount of sodium per serving. Choose foods with less than 5 percent of the Daily Value of sodium. Generally, foods with less than 300 mg of sodium per serving fit into this eating plan.  To find whole grains, look for the word "whole" as the first word in the ingredient list. Shopping  Buy products labeled as "low-sodium" or "no salt added."  Buy fresh foods. Avoid canned foods and premade or frozen meals. Cooking  Avoid adding salt when cooking. Use salt-free seasonings or herbs instead of table salt or sea salt. Check with your health care provider or pharmacist before using salt substitutes.  Do not fry foods. Heal foods using healthy methods such as baking, boiling, grilling, and broiling instead.  Sandles with heart-healthy oils, such as olive, canola, soybean, or sunflower oil. Meal planning   Eat a balanced diet that includes: ? 5 or more servings of fruits and vegetables each day. At each meal, try to fill half of your plate with fruits and vegetables. ? Up  to 6-8 servings of whole grains each day. ? Less than 6 oz of lean meat, poultry, or fish each day. A 3-oz serving of meat is about the same size as a deck of cards. One egg equals 1 oz. ? 2 servings of low-fat dairy each day. ? A serving of nuts, seeds, or beans 5 times each week. ? Heart-healthy fats. Healthy fats called Omega-3 fatty acids are found in foods such as flaxseeds and coldwater fish, like sardines, salmon, and mackerel.  Limit how much you eat of the following: ? Canned or prepackaged foods. ? Food that is high in trans fat, such as fried foods. ? Food that is high in saturated fat, such as fatty meat. ? Sweets, desserts, sugary drinks, and other foods with added sugar. ? Full-fat dairy products.  Do not salt foods before eating.  Try to eat at least 2 vegetarian meals each week.  Eat more home-cooked food and less restaurant, buffet, and fast food.  When eating at a restaurant,   ask that your food be prepared with less salt or no salt, if possible. What foods are recommended? The items listed may not be a complete list. Talk with your dietitian about what dietary choices are best for you. Grains Whole-grain or whole-wheat bread. Whole-grain or whole-wheat pasta. Brown rice. Oatmeal. Quinoa. Bulgur. Whole-grain and low-sodium cereals. Pita bread. Low-fat, low-sodium crackers. Whole-wheat flour tortillas. Vegetables Fresh or frozen vegetables (raw, steamed, roasted, or grilled). Low-sodium or reduced-sodium tomato and vegetable juice. Low-sodium or reduced-sodium tomato sauce and tomato paste. Low-sodium or reduced-sodium canned vegetables. Fruits All fresh, dried, or frozen fruit. Canned fruit in natural juice (without added sugar). Meat and other protein foods Skinless chicken or turkey. Ground chicken or turkey. Pork with fat trimmed off. Fish and seafood. Egg whites. Dried beans, peas, or lentils. Unsalted nuts, nut butters, and seeds. Unsalted canned beans. Lean cuts of  beef with fat trimmed off. Low-sodium, lean deli meat. Dairy Low-fat (1%) or fat-free (skim) milk. Fat-free, low-fat, or reduced-fat cheeses. Nonfat, low-sodium ricotta or cottage cheese. Low-fat or nonfat yogurt. Low-fat, low-sodium cheese. Fats and oils Soft margarine without trans fats. Vegetable oil. Low-fat, reduced-fat, or light mayonnaise and salad dressings (reduced-sodium). Canola, safflower, olive, soybean, and sunflower oils. Avocado. Seasoning and other foods Herbs. Spices. Seasoning mixes without salt. Unsalted popcorn and pretzels. Fat-free sweets. What foods are not recommended? The items listed may not be a complete list. Talk with your dietitian about what dietary choices are best for you. Grains Baked goods made with fat, such as croissants, muffins, or some breads. Dry pasta or rice meal packs. Vegetables Creamed or fried vegetables. Vegetables in a cheese sauce. Regular canned vegetables (not low-sodium or reduced-sodium). Regular canned tomato sauce and paste (not low-sodium or reduced-sodium). Regular tomato and vegetable juice (not low-sodium or reduced-sodium). Pickles. Olives. Fruits Canned fruit in a light or heavy syrup. Fried fruit. Fruit in cream or butter sauce. Meat and other protein foods Fatty cuts of meat. Ribs. Fried meat. Bacon. Sausage. Bologna and other processed lunch meats. Salami. Fatback. Hotdogs. Bratwurst. Salted nuts and seeds. Canned beans with added salt. Canned or smoked fish. Whole eggs or egg yolks. Chicken or turkey with skin. Dairy Whole or 2% milk, cream, and half-and-half. Whole or full-fat cream cheese. Whole-fat or sweetened yogurt. Full-fat cheese. Nondairy creamers. Whipped toppings. Processed cheese and cheese spreads. Fats and oils Butter. Stick margarine. Lard. Shortening. Ghee. Bacon fat. Tropical oils, such as coconut, palm kernel, or palm oil. Seasoning and other foods Salted popcorn and pretzels. Onion salt, garlic salt, seasoned  salt, table salt, and sea salt. Worcestershire sauce. Tartar sauce. Barbecue sauce. Teriyaki sauce. Soy sauce, including reduced-sodium. Steak sauce. Canned and packaged gravies. Fish sauce. Oyster sauce. Cocktail sauce. Horseradish that you find on the shelf. Ketchup. Mustard. Meat flavorings and tenderizers. Bouillon cubes. Hot sauce and Tabasco sauce. Premade or packaged marinades. Premade or packaged taco seasonings. Relishes. Regular salad dressings. Where to find more information:  National Heart, Lung, and Blood Institute: www.nhlbi.nih.gov  American Heart Association: www.heart.org Summary  The DASH eating plan is a healthy eating plan that has been shown to reduce high blood pressure (hypertension). It may also reduce your risk for type 2 diabetes, heart disease, and stroke.  With the DASH eating plan, you should limit salt (sodium) intake to 2,300 mg a day. If you have hypertension, you may need to reduce your sodium intake to 1,500 mg a day.  When on the DASH eating plan, aim to eat more fresh   fruits and vegetables, whole grains, lean proteins, low-fat dairy, and heart-healthy fats.  Work with your health care provider or diet and nutrition specialist (dietitian) to adjust your eating plan to your individual calorie needs. This information is not intended to replace advice given to you by your health care provider. Make sure you discuss any questions you have with your health care provider. Document Released: 04/16/2011 Document Revised: 04/20/2016 Document Reviewed: 04/20/2016 Elsevier Interactive Patient Education  2018 Elsevier Inc.  

## 2017-12-31 LAB — CBC WITH DIFFERENTIAL/PLATELET
Basophils Absolute: 0 x10E3/uL (ref 0.0–0.2)
Basos: 0 %
EOS (ABSOLUTE): 0.1 x10E3/uL (ref 0.0–0.4)
Eos: 2 %
Hematocrit: 41.3 % (ref 34.0–46.6)
Hemoglobin: 13.6 g/dL (ref 11.1–15.9)
Immature Grans (Abs): 0 x10E3/uL (ref 0.0–0.1)
Immature Granulocytes: 0 %
Lymphocytes Absolute: 2.9 x10E3/uL (ref 0.7–3.1)
Lymphs: 41 %
MCH: 29.4 pg (ref 26.6–33.0)
MCHC: 32.9 g/dL (ref 31.5–35.7)
MCV: 89 fL (ref 79–97)
Monocytes Absolute: 0.3 x10E3/uL (ref 0.1–0.9)
Monocytes: 5 %
Neutrophils Absolute: 3.6 x10E3/uL (ref 1.4–7.0)
Neutrophils: 52 %
Platelets: 259 x10E3/uL (ref 150–450)
RBC: 4.62 x10E6/uL (ref 3.77–5.28)
RDW: 14.7 % (ref 12.3–15.4)
WBC: 6.9 x10E3/uL (ref 3.4–10.8)

## 2017-12-31 LAB — BASIC METABOLIC PANEL WITH GFR
BUN/Creatinine Ratio: 14 (ref 9–23)
BUN: 11 mg/dL (ref 6–20)
CO2: 24 mmol/L (ref 20–29)
Calcium: 9.2 mg/dL (ref 8.7–10.2)
Chloride: 104 mmol/L (ref 96–106)
Creatinine, Ser: 0.81 mg/dL (ref 0.57–1.00)
GFR calc Af Amer: 110 mL/min/1.73
GFR calc non Af Amer: 95 mL/min/1.73
Glucose: 89 mg/dL (ref 65–99)
Potassium: 3.8 mmol/L (ref 3.5–5.2)
Sodium: 142 mmol/L (ref 134–144)

## 2017-12-31 LAB — LIPID PANEL
Chol/HDL Ratio: 2.7 ratio (ref 0.0–4.4)
Cholesterol, Total: 167 mg/dL (ref 100–199)
HDL: 61 mg/dL
LDL Calculated: 95 mg/dL (ref 0–99)
Triglycerides: 53 mg/dL (ref 0–149)
VLDL Cholesterol Cal: 11 mg/dL (ref 5–40)

## 2017-12-31 NOTE — Addendum Note (Signed)
Addended by: Candi LeashFULP, Bristyn Kulesza J on: 12/31/2017 08:43 AM   Modules accepted: Orders

## 2018-01-05 ENCOUNTER — Telehealth: Payer: Self-pay

## 2018-01-05 NOTE — Telephone Encounter (Signed)
CMA attempted twice to call patient, cma would call and it would ring 3-4 times and then a message would come on and say "im sorry but your call may not be completed at this time please hang up and try your call again". If patient returns call Please inform the patient that her labs were all within normal range.

## 2018-01-05 NOTE — Telephone Encounter (Signed)
-----   Message from Cain Saupeammie Fulp, MD sent at 12/31/2017  1:50 PM EDT ----- Notify patient that her lipid panel, CMP and CBC were normal

## 2018-01-13 ENCOUNTER — Encounter: Payer: Medicaid Other | Admitting: Pharmacist

## 2018-01-18 ENCOUNTER — Encounter: Payer: Medicaid Other | Admitting: Pharmacist

## 2018-02-09 ENCOUNTER — Encounter: Payer: Self-pay | Admitting: Pharmacist

## 2018-02-09 ENCOUNTER — Ambulatory Visit: Payer: Medicaid Other | Attending: Family Medicine | Admitting: Pharmacist

## 2018-02-09 VITALS — BP 141/87 | HR 73

## 2018-02-09 DIAGNOSIS — Z9114 Patient's other noncompliance with medication regimen: Secondary | ICD-10-CM | POA: Insufficient documentation

## 2018-02-09 DIAGNOSIS — Z823 Family history of stroke: Secondary | ICD-10-CM | POA: Insufficient documentation

## 2018-02-09 DIAGNOSIS — I10 Essential (primary) hypertension: Secondary | ICD-10-CM | POA: Insufficient documentation

## 2018-02-09 DIAGNOSIS — Z8249 Family history of ischemic heart disease and other diseases of the circulatory system: Secondary | ICD-10-CM | POA: Insufficient documentation

## 2018-02-09 NOTE — Progress Notes (Signed)
   S:    PCP: Dr. Jillyn Hidden  Patient arrives in good spirits. Presents to the clinic for hypertension evaluation, counseling, and management. Patient was referred by Dr. Jillyn Hidden on 12/30/17.  BP at that visit 159/96. No anti-hypertensive medications prior to that visit. Pt was started on amlodipine 5 mg.  Patient denies CP, SOB, or blurred vision. Does endorse "small headache". This is baseline. Reports dizziness at home.  Patient denies adherence with medications. Hasn't refilled since 12/30/17 original fill (#30 days).  Current BP Medications include:   - amlodipine 5 mg   Dietary habits include:  - Limits salt - Soda occasionally Exercise habits include: - Does not exercise Family / Social history:  - FH: HTN (MFM, mother), CVA (mother) - Tobacco: never smoker - Alcohol: occasional/social  Home BP readings:  - Does not take BP at home   O:  L arm after 5 minutes rest: 141/87, HR 73 Last 3 Office BP readings: BP Readings from Last 3 Encounters:  12/30/17 (!) 159/96  09/29/17 (!) 142/84  07/19/17 134/74    BMET    Component Value Date/Time   NA 142 12/30/2017 1004   K 3.8 12/30/2017 1004   CL 104 12/30/2017 1004   CO2 24 12/30/2017 1004   GLUCOSE 89 12/30/2017 1004   GLUCOSE 95 07/14/2017 0254   BUN 11 12/30/2017 1004   CREATININE 0.81 12/30/2017 1004   CALCIUM 9.2 12/30/2017 1004   GFRNONAA 95 12/30/2017 1004   GFRAA 110 12/30/2017 1004    Renal function: CrCl cannot be calculated (Patient's most recent lab result is older than the maximum 21 days allowed.).  A/P: Hypertension longstanding currently uncontrolled on amlodipine. BP Goal <130/80 mmHg. Patient is not adherent with amlodipine. She has been without it for the past week because she did not understand the process behind calling in refills. I have reinforced adherence and provided education concerning refills. Will have her continue current dose for now with reassessment in 2 weeks.   -Continued amlodipine 5  mg.  -Counseled on lifestyle modifications for blood pressure control including reduced dietary sodium, increased exercise, adequate sleep  Results reviewed and written information provided. Total time in face-to-face counseling 15 minutes.   F/U Clinic Visit 02/23/18.    Patient seen with:  Leanne Chang, PharmD Candidate [employer] Class of 2021  Butch Penny, PharmD, CPP Clinical Pharmacist [facility] [phone]

## 2018-02-09 NOTE — Patient Instructions (Signed)
Thank you for coming to see [name] today.   Blood pressure today is improved.  Continue taking amlodipine 5 mg but start taking before bed.   Limiting salt and caffeine, as well as exercising as able for at least 30 minutes for 5 days out of the week, can also help you lower your blood pressure.  Take your blood pressure at home if you are able. Please write down these numbers and bring them to your visits.  If you have any questions about medications, please call me [phone].  Franky Macho

## 2018-02-23 ENCOUNTER — Ambulatory Visit: Payer: Medicaid Other | Attending: Family Medicine | Admitting: Pharmacist

## 2018-02-23 ENCOUNTER — Encounter: Payer: Medicaid Other | Admitting: Pharmacist

## 2018-02-23 ENCOUNTER — Encounter: Payer: Self-pay | Admitting: Pharmacist

## 2018-02-23 VITALS — BP 147/84 | HR 74

## 2018-02-23 DIAGNOSIS — I10 Essential (primary) hypertension: Secondary | ICD-10-CM

## 2018-02-23 DIAGNOSIS — Z8249 Family history of ischemic heart disease and other diseases of the circulatory system: Secondary | ICD-10-CM | POA: Insufficient documentation

## 2018-02-23 MED ORDER — AMLODIPINE BESYLATE 10 MG PO TABS: 10.0000 mg | ORAL_TABLET | Freq: Every day | ORAL | 0 refills | Status: DC

## 2018-02-23 NOTE — Progress Notes (Addendum)
   S:    PCP: Dr. Jillyn Hidden  Patient arrives in good spirits. Presents to the clinic for hypertension evaluation, counseling, and management. Patient was referred by Dr. Jillyn Hidden on 12/30/17. I last saw her 02/09/18. BP at that visit 141/87 but pt reported med non-adherence. Amlodipine 5 mg daily was continued.   Patient denies CP, SOB, HA, or blurred vision. Denies LE edema.    Patient reports adherence with medications. Denies dizziness since taking at night.   Current BP Medications include:   - amlodipine 5 mg   Dietary habits include:  - Substitutes salt (uses Mrs. Sharilyn Sites) Neita Carp occasionally Exercise habits include: - 20 mins/treadmill daily - some weight resistance training Family / Social history:  - FH: HTN (MFM, mother), CVA (mother) - Tobacco: never smoker - Alcohol: occasional/social  Home BP readings:  - Occasionally takes at [employer] - Gives SBPs 130s  O:  L arm after 5 minutes rest: 147/84, HR 74  Last 3 Office BP readings: BP Readings from Last 3 Encounters:  02/09/18 (!) 141/87  12/30/17 (!) 159/96  09/29/17 (!) 142/84   BMET    Component Value Date/Time   NA 142 12/30/2017 1004   K 3.8 12/30/2017 1004   CL 104 12/30/2017 1004   CO2 24 12/30/2017 1004   GLUCOSE 89 12/30/2017 1004   GLUCOSE 95 07/14/2017 0254   BUN 11 12/30/2017 1004   CREATININE 0.81 12/30/2017 1004   CALCIUM 9.2 12/30/2017 1004   GFRNONAA 95 12/30/2017 1004   GFRAA 110 12/30/2017 1004    Renal function: CrCl cannot be calculated (Patient's most recent lab result is older than the maximum 21 days allowed.).  A/P: Hypertension longstanding currently uncontrolled on amlodipine 5 daily. BP Goal <130/80 mmHg. Patient is adherent with medications. Will increase to 10 mg daily.   -Increase amlodipine to 10 mg.  -Counseled on lifestyle modifications for blood pressure control including reduced dietary sodium, increased exercise, adequate sleep  Results reviewed and written information  provided. Total time in face-to-face counseling 15 minutes.   F/U Clinic Visit 02/23/18.    Patient seen with:  Leanne Chang, PharmD Candidate [employer] Class of 2021  Butch Penny, PharmD, CPP Clinical Pharmacist [facility] [phone]

## 2018-02-23 NOTE — Patient Instructions (Signed)
Thank you for coming to see [name] today.   Blood pressure today is still elevated so we need to increase your amlodipine.   Limiting salt and caffeine, as well as exercising as able for at least 30 minutes for 5 days out of the week, can also help you lower your blood pressure.  Take your blood pressure at home if you are able. Please write down these numbers and bring them to your visits.  If you have any questions about medications, please call me [phone].  Krystal Chan

## 2018-03-03 ENCOUNTER — Emergency Department (HOSPITAL_COMMUNITY)
Admission: EM | Admit: 2018-03-03 | Discharge: 2018-03-03 | Disposition: A | Discharge status: Home / Self Care | Payer: Medicaid Other | Attending: Emergency Medicine | Admitting: Emergency Medicine

## 2018-03-03 ENCOUNTER — Other Ambulatory Visit: Payer: Self-pay

## 2018-03-03 ENCOUNTER — Encounter (HOSPITAL_COMMUNITY): Payer: Self-pay | Admitting: Emergency Medicine

## 2018-03-03 DIAGNOSIS — I10 Essential (primary) hypertension: Secondary | ICD-10-CM | POA: Insufficient documentation

## 2018-03-03 DIAGNOSIS — J069 Acute upper respiratory infection, unspecified: Secondary | ICD-10-CM | POA: Diagnosis not present

## 2018-03-03 DIAGNOSIS — Z79899 Other long term (current) drug therapy: Secondary | ICD-10-CM | POA: Insufficient documentation

## 2018-03-03 DIAGNOSIS — R05 Cough: Secondary | ICD-10-CM | POA: Diagnosis present

## 2018-03-03 HISTORY — DX: Essential (primary) hypertension: I10

## 2018-03-03 MED ORDER — BENZONATATE 100 MG PO CAPS: 100.0000 mg | ORAL_CAPSULE | Freq: Three times a day (TID) | ORAL | 0 refills | Status: DC

## 2018-03-03 MED ORDER — FLUTICASONE PROPIONATE 50 MCG/ACT NA SUSP: 1.0000 | Freq: Every day | NASAL | 0 refills | Status: DC

## 2018-03-03 MED ORDER — CETIRIZINE HCL 10 MG PO TABS: 10.0000 mg | ORAL_TABLET | Freq: Every day | ORAL | 0 refills | Status: DC

## 2018-03-03 NOTE — ED Provider Notes (Signed)
[facility] EMERGENCY DEPARTMENT Provider Note   CSN: [id number] Arrival date & time: 03/03/18  4098     History   Chief Complaint Chief Complaint  Patient presents with  . flu sx    HPI Krystal Chan is a 32 y.o. female.  32 year old female presents with complaint of coughing, sneezing, congestion, body aches x3 days.  Patient took Alka-Seltzer cold once yesterday which helped with her symptoms however she was concerned that she was still sick today so she came to be seen.  Patient has a history of high blood pressure, notes that her blood pressure is elevated today in triage, unsure if related to her cold or cold medicine she is taking.  Patient works at [employer], no specific sick contacts.  No history of chronic lung disease.  Patient has small children at home and is concerned they could catch her cold.  No other complaints or concerns.     Past Medical History:  Diagnosis Date  . History of preterm delivery, currently pregnant 02/28/2015   [ ] 17-P  . Hypertension   . Umbilical hernia    repaired ~2004    Patient Active Problem List   Diagnosis Date Noted  . Anemia 12/30/2017    Past Surgical History:  Procedure Laterality Date  . CESAREAN SECTION MULTI-GESTATIONAL N/A 07/14/2017   Procedure: CESAREAN SECTION MULTI-GESTATIONAL;  Surgeon: Levie Heritage, DO;  [facility];  Service: Obstetrics;  Laterality: N/A;  . HERNIA REPAIR     gastic hernia 2005 and umbilical hernia 2004  . LAPAROTOMY N/A 07/17/2017   Procedure: Exploratory Laparottomy, Evacuation of Abdominal Wall Hematoma;  Surgeon: Reva Bores, MD;  Location: [facility];  Service: Gynecology;  Laterality: N/A;     OB History    Gravida  6   Para  4   Term  2   Preterm  2   AB  2   Living  5     SAB      TAB  2   Ectopic      Multiple  1   Live Births  5            Home Medications    Prior to Admission medications   Medication Sig Start Date End  Date Taking? Authorizing Provider  amLODipine (NORVASC) 10 MG tablet Take 1 tablet (10 mg total) by mouth daily. 02/23/18   [name], MD  benzonatate (TESSALON) 100 MG capsule Take 1 capsule (100 mg total) by mouth every 8 (eight) hours. 03/03/18   Jeannie Fend, PA-C  cetirizine (ZYRTEC ALLERGY) 10 MG tablet Take 1 tablet (10 mg total) by mouth daily. 03/03/18 04/02/18  Jeannie Fend, PA-C  fluticasone (FLONASE) 50 MCG/ACT nasal spray Place 1 spray into both nostrils daily. 03/03/18   Jeannie Fend, PA-C    Family History Family History  Problem Relation Age of Onset  . Breast cancer Maternal Grandmother   . Hypertension Maternal Grandmother   . Cancer Maternal Grandmother        breast  . Hypertension Mother   . Heart disease Mother   . Hypertension Paternal Grandmother   . Diabetes Paternal Grandmother   . Diabetes Father   . Hearing loss Neg Hx     Social History Social History   Tobacco Use  . Smoking status: Never Smoker  . Smokeless tobacco: Never Used  Substance Use Topics  . Alcohol use: No    Comment: ocassionally  . Drug  use: No     Allergies   Patient has no known allergies.   Review of Systems Review of Systems  Constitutional: Negative for chills, diaphoresis and fever.  HENT: Positive for congestion, rhinorrhea, sinus pressure, sneezing and sore throat. Negative for ear pain and sinus pain.   Respiratory: Positive for cough. Negative for shortness of breath and wheezing.   Gastrointestinal: Negative for diarrhea, nausea and vomiting.  Musculoskeletal: Positive for arthralgias and myalgias.  Skin: Negative for rash and wound.  Allergic/Immunologic: Negative for immunocompromised state.  Neurological: Negative for headaches.  Psychiatric/Behavioral: Negative for confusion.  All other systems reviewed and are negative.    Physical Exam Updated Vital Signs BP (!) 140/100 (BP Location: Right Arm)   Pulse 72   Temp 99.5 F (37.5 C)  (Oral)   Resp 18   Ht 5\' 1"  (1.549 m)   Wt 91.2 kg   LMP 03/03/2018   SpO2 97%   BMI 37.98 kg/m   Physical Exam  Constitutional: She is oriented to person, place, and time. She appears well-developed and well-nourished. No distress.  HENT:  Head: Normocephalic and atraumatic.  Right Ear: External ear normal.  Left Ear: External ear normal.  Nose: Mucosal edema present. Right sinus exhibits no maxillary sinus tenderness and no frontal sinus tenderness. Left sinus exhibits no maxillary sinus tenderness and no frontal sinus tenderness.  Mouth/Throat: Mucous membranes are normal. Posterior oropharyngeal erythema present. No posterior oropharyngeal edema or tonsillar abscesses. Tonsils are 1+ on the right. Tonsils are 1+ on the left. No tonsillar exudate.  Eyes: Conjunctivae are normal. Right eye exhibits no discharge. Left eye exhibits no discharge.  Neck: Neck supple.  Cardiovascular: Normal rate, regular rhythm, normal heart sounds and intact distal pulses.  No murmur heard. Pulmonary/Chest: Effort normal and breath sounds normal. No respiratory distress.  Lymphadenopathy:    She has no cervical adenopathy.  Neurological: She is alert and oriented to person, place, and time.  Skin: Skin is warm and dry. No rash noted. She is not diaphoretic.  Psychiatric: She has a normal mood and affect. Her behavior is normal.  Nursing note and vitals reviewed.    ED Treatments / Results  Labs (all labs ordered are listed, but only abnormal results are displayed) Labs Reviewed - No data to display  EKG None  Radiology No results found.  Procedures Procedures (including critical care time)  Medications Ordered in ED Medications - No data to display   Initial Impression / Assessment and Plan / ED Course  I have reviewed the triage vital signs and the nursing notes.  Pertinent labs & imaging results that were available during my care of the patient were reviewed by me and considered  in my medical decision making (see chart for details).  Clinical Course as of Mar 04 1007  Thu Mar 03, 2018  2085 32 year old female with complaint of URI symptoms x3 days.  Patient is otherwise healthy, no history of chronic lung disease, does not vape.  Patient's blood pressure is elevated, recommend Coricidin HBP and symptom medic treatment.  Follow-up with PCP.  Advised to wash hands and wear mask if needed around her children.  Recommend flu shot for all.  Return for worsening or concerning symptoms.   [LM]    Clinical Course User Index [LM] Jeannie Fend, PA-C   Final Clinical Impressions(s) / ED Diagnoses   Final diagnoses:  Viral upper respiratory tract infection    ED Discharge Orders  Ordered    fluticasone (FLONASE) 50 MCG/ACT nasal spray  Daily     03/03/18 1007    benzonatate (TESSALON) 100 MG capsule  Every 8 hours     03/03/18 1007    cetirizine (ZYRTEC ALLERGY) 10 MG tablet  Daily     03/03/18 1007           Alden Hipp 03/03/18 1008    Alvira Monday, MD 03/04/18 2235

## 2018-03-03 NOTE — Discharge Instructions (Addendum)
Saline sinus rinse twice daily.  Use Flonase twice daily for the first 5 days and continue with daily use. Zyrtec daily as directed. Tessalon as needed as prescribed for cough. Follow-up with your primary care doctor, return to ER for worsening or concerning symptoms.

## 2018-03-03 NOTE — ED Triage Notes (Signed)
C/o body aches, chills, sore throat, cough since Monday-- has used OTC meds without relief.

## 2018-03-28 ENCOUNTER — Ambulatory Visit: Payer: Medicaid Other | Attending: Family Medicine | Admitting: Pharmacist

## 2018-03-28 VITALS — BP 132/84 | HR 68

## 2018-03-28 DIAGNOSIS — I10 Essential (primary) hypertension: Secondary | ICD-10-CM | POA: Diagnosis not present

## 2018-03-28 DIAGNOSIS — Z8249 Family history of ischemic heart disease and other diseases of the circulatory system: Secondary | ICD-10-CM | POA: Diagnosis not present

## 2018-03-28 DIAGNOSIS — Z79899 Other long term (current) drug therapy: Secondary | ICD-10-CM | POA: Diagnosis not present

## 2018-03-28 NOTE — Patient Instructions (Signed)
Thank you for coming to see us today.   Blood pressure today is improving.   Continue taking amlodipine 10 mg before bedtime.   Limiting salt and caffeine, as well as exercising as able for at least 30 minutes for 5 days out of the week, can also help you lower your blood pressure.  Take your blood pressure at home if you are able. Please write down these numbers and bring them to your visits.  If you have any questions about medications, please call me [phone].  Franky MachoLuke

## 2018-03-28 NOTE — Progress Notes (Signed)
   S:    PCP: Dr. Jillyn HiddenFulp  Patient arrives in good spirits. Presents to the clinic for hypertension management. Patient was referred by Dr. Jillyn HiddenFulp on 12/30/17. I last saw her 02/23/18. BP at that visit 147/84. I increased her amlodipine to 10 mg daily.  Patient denies CP, SOB, HA, or blurred vision. Denies LE edema.    Patient reports adherence with medications.    Current BP Medications include:   - amlodipine 10 mg   Dietary habits include:  - Substitutes salt (uses Mrs. Sharilyn Sitesash) Neita Carp- Soda occasionally Exercise habits include: - 20 mins/treadmill daily - some weight resistance training Family / Social history:  - FH: HTN (MFM, mother), CVA (mother) - Tobacco: never smoker - Alcohol: occasional/social  Home BP readings:  - Occasionally takes at [employer] - Gives SBPs 130s/80s  O:  L arm after 5 minutes rest: 132/84, HR 68  Last 3 Office BP readings: BP Readings from Last 3 Encounters:  03/03/18 (!) 140/100  02/23/18 (!) 147/84  02/09/18 (!) 141/87   BMET    Component Value Date/Time   NA 142 12/30/2017 1004   K 3.8 12/30/2017 1004   CL 104 12/30/2017 1004   CO2 24 12/30/2017 1004   GLUCOSE 89 12/30/2017 1004   GLUCOSE 95 07/14/2017 0254   BUN 11 12/30/2017 1004   CREATININE 0.81 12/30/2017 1004   CALCIUM 9.2 12/30/2017 1004   GFRNONAA 95 12/30/2017 1004   GFRAA 110 12/30/2017 1004    Renal function: CrCl cannot be calculated (Patient's most recent lab result is older than the maximum 21 days allowed.).  A/P: Hypertension longstanding currently uncontrolled but improving on amlodipine 10 mg daily. BP Goal <130/80 mmHg. Patient is adherent with medications. She wishes to continue with amlodipine and not add additional agents at this time.   -Continue amlodipine to 10 mg.  -Counseled on lifestyle modifications for blood pressure control including reduced dietary sodium, increased exercise, adequate sleep  Results reviewed and written information provided. Total time in  face-to-face counseling 15 minutes.   F/U Clinic Visit 02/23/18.    Patient seen with:  Leanne ChangJane Chu, PharmD Candidate [facility] [employer]  Butch PennyLuke Van Ausdall, PharmD, CPP Clinical Pharmacist [facility] [phone]

## 2018-03-29 ENCOUNTER — Encounter: Payer: Self-pay | Admitting: Pharmacist

## 2018-04-04 ENCOUNTER — Ambulatory Visit: Payer: Medicaid Other | Admitting: Family Medicine

## 2018-04-15 ENCOUNTER — Ambulatory Visit: Payer: Medicaid Other | Admitting: Family Medicine

## 2018-05-24 ENCOUNTER — Ambulatory Visit: Payer: Medicaid Other | Admitting: Family Medicine

## 2018-06-22 ENCOUNTER — Ambulatory Visit: Payer: Medicaid Other | Attending: Family Medicine | Admitting: Family Medicine

## 2018-06-22 ENCOUNTER — Encounter: Payer: Self-pay | Admitting: Family Medicine

## 2018-06-22 VITALS — BP 126/84 | HR 73 | Temp 98.4°F | Ht 62.0 in | Wt 226.0 lb

## 2018-06-22 DIAGNOSIS — Z8249 Family history of ischemic heart disease and other diseases of the circulatory system: Secondary | ICD-10-CM | POA: Insufficient documentation

## 2018-06-22 DIAGNOSIS — R059 Cough, unspecified: Secondary | ICD-10-CM

## 2018-06-22 DIAGNOSIS — R05 Cough: Secondary | ICD-10-CM | POA: Insufficient documentation

## 2018-06-22 DIAGNOSIS — I10 Essential (primary) hypertension: Secondary | ICD-10-CM | POA: Diagnosis not present

## 2018-06-22 DIAGNOSIS — Z79899 Other long term (current) drug therapy: Secondary | ICD-10-CM | POA: Diagnosis not present

## 2018-06-22 MED ORDER — CETIRIZINE HCL 10 MG PO TABS: 10.0000 mg | ORAL_TABLET | Freq: Every day | ORAL | 0 refills | Status: DC

## 2018-06-22 MED ORDER — BENZONATATE 100 MG PO CAPS: 100.0000 mg | ORAL_CAPSULE | Freq: Three times a day (TID) | ORAL | 0 refills | Status: DC

## 2018-06-22 MED ORDER — AMLODIPINE BESYLATE 10 MG PO TABS: 10.0000 mg | ORAL_TABLET | Freq: Every day | ORAL | 1 refills | Status: DC

## 2018-06-22 NOTE — Progress Notes (Signed)
Subjective:  Patient ID: Krystal Chan, female    DOB: [birth year]  Age: 33 y.o. MRN: [id number]  CC: Hypertension   HPI Krystal Chan is a 33 year old female who presents today for follow-up of hypertension and endorses compliance with amlodipine, denies adverse effects from amlodipine. She endorses the presence of a cough for the last 4 days and has used OTC Robitussin with no relief in symptoms.  Denies otalgia, rhinorrhea, sinus pressure or pain, fever. She does not exercise regularly but tries to adhere to a low-sodium diet. Denies chest pain, dyspnea, wheezing.  Past Medical History:  Diagnosis Date  . History of preterm delivery, currently pregnant 02/28/2015   [ ] 17-P  . Hypertension   . Umbilical hernia    repaired ~2004    Past Surgical History:  Procedure Laterality Date  . CESAREAN SECTION MULTI-GESTATIONAL N/A 07/14/2017   Procedure: CESAREAN SECTION MULTI-GESTATIONAL;  Surgeon: Levie Heritage, DO;  [facility];  Service: Obstetrics;  Laterality: N/A;  . HERNIA REPAIR     gastic hernia 2005 and umbilical hernia 2004  . LAPAROTOMY N/A 07/17/2017   Procedure: Exploratory Laparottomy, Evacuation of Abdominal Wall Hematoma;  Surgeon: Reva Bores, MD;  Location: [facility];  Service: Gynecology;  Laterality: N/A;    Family History  Problem Relation Age of Onset  . Breast cancer Maternal Grandmother   . Hypertension Maternal Grandmother   . Cancer Maternal Grandmother        breast  . Hypertension Mother   . Heart disease Mother   . Hypertension Paternal Grandmother   . Diabetes Paternal Grandmother   . Diabetes Father   . Hearing loss Neg Hx     No Known Allergies  Outpatient Medications Prior to Visit  Medication Sig Dispense Refill  . amLODipine (NORVASC) 10 MG tablet Take 1 tablet (10 mg total) by mouth daily. 90 tablet 0  . fluticasone (FLONASE) 50 MCG/ACT nasal spray Place 1 spray into both nostrils daily. (Patient not taking: Reported on  06/22/2018) 16 g 0  . benzonatate (TESSALON) 100 MG capsule Take 1 capsule (100 mg total) by mouth every 8 (eight) hours. (Patient not taking: Reported on 06/22/2018) 21 capsule 0  . cetirizine (ZYRTEC ALLERGY) 10 MG tablet Take 1 tablet (10 mg total) by mouth daily. 30 tablet 0   No facility-administered medications prior to visit.      ROS Review of Systems General: negative for fever, weight loss, appetite change Eyes: no visual symptoms. ENT: no ear symptoms, no sinus tenderness, no nasal congestion or sore throat. Neck: no pain  Respiratory: no wheezing, shortness of breath, +cough Cardiovascular: no chest pain, no dyspnea on exertion, no pedal edema, no orthopnea. Gastrointestinal: no abdominal pain, no diarrhea, no constipation Genito-Urinary: no urinary frequency, no dysuria, no polyuria. Hematologic: no bruising Endocrine: no cold or heat intolerance Neurological: no headaches, no seizures, no tremors Musculoskeletal: no joint pains, no joint swelling Skin: no pruritus, no rash. Psychological: no depression, no anxiety,    Objective:  BP 126/84   Pulse 73   Temp 98.4 F (36.9 C) (Oral)   Ht 5\' 2"  (1.575 m)   Wt 226 lb (102.5 kg)   SpO2 100%   BMI 41.34 kg/m   BP/Weight 06/22/2018 03/28/2018 03/03/2018  Systolic BP 126 132 140  Diastolic BP 84 84 100  Wt. (Lbs) 226 - 201  BMI 41.34 - 37.98      Physical Exam Constitutional: normal appearing,  Eyes: PERRLA HEENT: Head is atraumatic,  normal sinuses, normal oropharynx, normal appearing tonsils and palate, tympanic membrane is normal bilaterally. Neck: normal range of motion, no thyromegaly, no JVD Cardiovascular: normal rate and rhythm, normal heart sounds, no murmurs, rub or gallop, no pedal edema Respiratory: Normal breath sounds, clear to auscultation bilaterally, no wheezes, no rales, no rhonchi Abdomen: soft, not tender to palpation, normal bowel sounds, no enlarged organs Musculoskeletal: Full ROM, no  tenderness in joints Skin: warm and dry, no lesions. Neurological: alert, oriented x3, cranial nerves I-XII grossly intact , normal motor strength, normal sensation. Psychological: normal mood.   CMP Latest Ref Rng & Units 12/30/2017 07/14/2017 04/22/2015  Glucose 65 - 99 mg/dL 89 95 87  BUN 6 - 20 mg/dL 11 5(L) 6  Creatinine 5.780.57 - 1.00 mg/dL 4.690.81 6.29(B0.37(L) 2.840.59  Sodium 134 - 144 mmol/L 142 134(L) 137  Potassium 3.5 - 5.2 mmol/L 3.8 3.5 3.8  Chloride 96 - 106 mmol/L 104 104 104  CO2 20 - 29 mmol/L 24 20(L) 24  Calcium 8.7 - 10.2 mg/dL 9.2 1.3(K8.6(L) 9.0  Total Protein 6.5 - 8.1 g/dL - 6.8 -  Total Bilirubin 0.3 - 1.2 mg/dL - 0.6 -  Alkaline Phos 38 - 126 U/L - 117 -  AST 15 - 41 U/L - 20 -  ALT 14 - 54 U/L - 13(L) -    Lipid Panel     Component Value Date/Time   CHOL 167 12/30/2017 1004   TRIG 53 12/30/2017 1004   HDL 61 12/30/2017 1004   CHOLHDL 2.7 12/30/2017 1004   LDLCALC 95 12/30/2017 1004    CBC    Component Value Date/Time   WBC 6.9 12/30/2017 1004   WBC 11.9 (H) 07/19/2017 0553   RBC 4.62 12/30/2017 1004   RBC 2.89 (L) 07/19/2017 0553   HGB 13.6 12/30/2017 1004   HCT 41.3 12/30/2017 1004   PLT 259 12/30/2017 1004   MCV 89 12/30/2017 1004   MCH 29.4 12/30/2017 1004   MCH 28.0 07/19/2017 0553   MCHC 32.9 12/30/2017 1004   MCHC 32.5 07/19/2017 0553   RDW 14.7 12/30/2017 1004   LYMPHSABS 2.9 12/30/2017 1004   EOSABS 0.1 12/30/2017 1004   BASOSABS 0.0 12/30/2017 1004      Assessment & Plan:   1. Essential hypertension Controlled Counseled on blood pressure goal of less than 130/80, low-sodium, DASH diet, medication compliance, 150 minutes of moderate intensity exercise per week. Discussed medication compliance, adverse effects. - amLODipine (NORVASC) 10 MG tablet; Take 1 tablet (10 mg total) by mouth daily.  Dispense: 90 tablet; Refill: 1  2. Cough Likely sinus related - cetirizine (ZYRTEC ALLERGY) 10 MG tablet; Take 1 tablet (10 mg total) by mouth daily  for 30 days.  Dispense: 30 tablet; Refill: 0 - benzonatate (TESSALON) 100 MG capsule; Take 1 capsule (100 mg total) by mouth every 8 (eight) hours.  Dispense: 21 capsule; Refill: 0   Meds ordered this encounter  Medications  . amLODipine (NORVASC) 10 MG tablet    Sig: Take 1 tablet (10 mg total) by mouth daily.    Dispense:  90 tablet    Refill:  1  . cetirizine (ZYRTEC ALLERGY) 10 MG tablet    Sig: Take 1 tablet (10 mg total) by mouth daily for 30 days.    Dispense:  30 tablet    Refill:  0  . benzonatate (TESSALON) 100 MG capsule    Sig: Take 1 capsule (100 mg total) by mouth every 8 (eight) hours.  Dispense:  21 capsule    Refill:  0    Follow-up: Return in about 6 months (around 12/21/2018) for Follow-up of chronic medical conditions.       Hoy RegisterEnobong Hedwig Mcfall, MD, FAAFP. [facility] [place], [place] [phone]   06/22/2018, 4:56 PM

## 2018-12-20 ENCOUNTER — Other Ambulatory Visit: Payer: Self-pay | Admitting: Family Medicine

## 2018-12-20 DIAGNOSIS — I10 Essential (primary) hypertension: Secondary | ICD-10-CM

## 2019-02-15 ENCOUNTER — Ambulatory Visit: Payer: Medicaid Other | Attending: Family Medicine | Admitting: Family Medicine

## 2019-02-15 ENCOUNTER — Encounter: Payer: Self-pay | Admitting: Family Medicine

## 2019-02-15 ENCOUNTER — Other Ambulatory Visit: Payer: Self-pay

## 2019-02-15 DIAGNOSIS — Z8249 Family history of ischemic heart disease and other diseases of the circulatory system: Secondary | ICD-10-CM | POA: Insufficient documentation

## 2019-02-15 DIAGNOSIS — J018 Other acute sinusitis: Secondary | ICD-10-CM

## 2019-02-15 DIAGNOSIS — Z833 Family history of diabetes mellitus: Secondary | ICD-10-CM | POA: Insufficient documentation

## 2019-02-15 DIAGNOSIS — Z1159 Encounter for screening for other viral diseases: Secondary | ICD-10-CM | POA: Diagnosis not present

## 2019-02-15 DIAGNOSIS — Z6841 Body Mass Index (BMI) 40.0 and over, adult: Secondary | ICD-10-CM | POA: Insufficient documentation

## 2019-02-15 DIAGNOSIS — I10 Essential (primary) hypertension: Secondary | ICD-10-CM | POA: Insufficient documentation

## 2019-02-15 DIAGNOSIS — Z803 Family history of malignant neoplasm of breast: Secondary | ICD-10-CM | POA: Insufficient documentation

## 2019-02-15 DIAGNOSIS — Z20822 Contact with and (suspected) exposure to covid-19: Secondary | ICD-10-CM

## 2019-02-15 DIAGNOSIS — Z79899 Other long term (current) drug therapy: Secondary | ICD-10-CM | POA: Diagnosis not present

## 2019-02-15 DIAGNOSIS — J029 Acute pharyngitis, unspecified: Secondary | ICD-10-CM | POA: Insufficient documentation

## 2019-02-15 MED ORDER — FLUTICASONE PROPIONATE 50 MCG/ACT NA SUSP: 1.0000 | Freq: Every day | NASAL | 1 refills | Status: DC

## 2019-02-15 MED ORDER — CETIRIZINE HCL 10 MG PO TABS: 10.0000 mg | ORAL_TABLET | Freq: Every day | ORAL | 1 refills | Status: DC

## 2019-02-15 MED ORDER — AMLODIPINE BESYLATE 10 MG PO TABS: 10.0000 mg | ORAL_TABLET | Freq: Every day | ORAL | 1 refills | Status: DC

## 2019-02-15 NOTE — Patient Instructions (Signed)

## 2019-02-15 NOTE — Progress Notes (Signed)
Subjective:  Patient ID: Krystal Chan, female    DOB: January 19, 1986  Age: 33 y.o. MRN: [id number]  CC: Sore Throat   HPI Krystal Chan is a 33 year old female with a history of Hypertension, Obesity who presents with a 2 day history of sore throat, nasal congestion but denies presence of facial pressure, otalgia, myalgia, fever, abnormal smell but has had a slightly abnormal taste. She denies presence of headacahes or sick contacts. Used alka seltzer, Nyquil with no much relief.  Tolerating her antihypertensive with no adverse effects. She does not get much exercise.  Past Medical History:  Diagnosis Date  . History of preterm delivery, currently pregnant 02/28/2015   [ ] 17-P  . Hypertension   . Umbilical hernia    repaired ~2004    Past Surgical History:  Procedure Laterality Date  . CESAREAN SECTION MULTI-GESTATIONAL N/A 07/14/2017   Procedure: CESAREAN SECTION MULTI-GESTATIONAL;  Surgeon: 09/13/2017, DO;  [facility];  Service: Obstetrics;  Laterality: N/A;  . HERNIA REPAIR     gastic hernia 2005 and umbilical hernia 2004  . LAPAROTOMY N/A 07/17/2017   Procedure: Exploratory Laparottomy, Evacuation of Abdominal Wall Hematoma;  Surgeon: 09/16/2017, MD;  Location: [facility];  Service: Gynecology;  Laterality: N/A;    Family History  Problem Relation Age of Onset  . Breast cancer Maternal Grandmother   . Hypertension Maternal Grandmother   . Cancer Maternal Grandmother        breast  . Hypertension Mother   . Heart disease Mother   . Hypertension Paternal Grandmother   . Diabetes Paternal Grandmother   . Diabetes Father   . Hearing loss Neg Hx     No Known Allergies  Outpatient Medications Prior to Visit  Medication Sig Dispense Refill  . amLODipine (NORVASC) 10 MG tablet Take 1 tablet (10 mg total) by mouth daily. 90 tablet 1  . benzonatate (TESSALON) 100 MG capsule Take 1 capsule (100 mg total) by mouth every 8 (eight) hours. (Patient not taking:  Reported on 02/15/2019) 21 capsule 0  . cetirizine (ZYRTEC ALLERGY) 10 MG tablet Take 1 tablet (10 mg total) by mouth daily for 30 days. 30 tablet 0  . fluticasone (FLONASE) 50 MCG/ACT nasal spray Place 1 spray into both nostrils daily. (Patient not taking: Reported on 06/22/2018) 16 g 0   No facility-administered medications prior to visit.      ROS Review of Systems  Constitutional: Negative for activity change, appetite change, fatigue and fever.  HENT: Positive for congestion and sore throat. Negative for sinus pressure.   Eyes: Negative for visual disturbance.  Respiratory: Negative for cough, chest tightness, shortness of breath and wheezing.   Cardiovascular: Negative for chest pain and palpitations.  Gastrointestinal: Negative for abdominal distention, abdominal pain and constipation.  Endocrine: Negative for polydipsia.  Genitourinary: Negative for dysuria and frequency.  Musculoskeletal: Negative for arthralgias and back pain.  Skin: Negative for rash.  Neurological: Negative for tremors, light-headedness and numbness.  Hematological: Does not bruise/bleed easily.  Psychiatric/Behavioral: Negative for agitation and behavioral problems.    Objective:  BP 122/74   Pulse 66   Temp 98.5 F (36.9 C) (Oral)   Ht 5\' 2"  (1.575 m)   Wt 223 lb (101.2 kg)   SpO2 96%   BMI 40.79 kg/m   BP/Weight 02/15/2019 06/22/2018 03/28/2018  Systolic BP 122 126 132  Diastolic BP 74 84 84  Wt. (Lbs) 223 226 -  BMI 40.79 41.34 -  Physical Exam Constitutional:      Appearance: She is well-developed.  HENT:     Right Ear: Tympanic membrane normal.     Left Ear: Tympanic membrane normal.  Neck:     Vascular: No JVD.  Cardiovascular:     Rate and Rhythm: Normal rate.     Heart sounds: Normal heart sounds. No murmur.  Pulmonary:     Effort: Pulmonary effort is normal.     Breath sounds: Normal breath sounds. No wheezing or rales.  Chest:     Chest wall: No tenderness.   Abdominal:     General: Bowel sounds are normal. There is no distension.     Palpations: Abdomen is soft. There is no mass.     Tenderness: There is no abdominal tenderness.  Musculoskeletal: Normal range of motion.     Right lower leg: No edema.     Left lower leg: No edema.  Lymphadenopathy:     Cervical: No cervical adenopathy.  Neurological:     Mental Status: She is alert and oriented to person, place, and time.  Psychiatric:        Mood and Affect: Mood normal.     CMP Latest Ref Rng & Units 12/30/2017 07/14/2017 04/22/2015  Glucose 65 - 99 mg/dL 89 95 87  BUN 6 - 20 mg/dL 11 5(L) 6  Creatinine 0.57 - 1.00 mg/dL 0.81 0.37(L) 0.59  Sodium 134 - 144 mmol/L 142 134(L) 137  Potassium 3.5 - 5.2 mmol/L 3.8 3.5 3.8  Chloride 96 - 106 mmol/L 104 104 104  CO2 20 - 29 mmol/L 24 20(L) 24  Calcium 8.7 - 10.2 mg/dL 9.2 8.6(L) 9.0  Total Protein 6.5 - 8.1 g/dL - 6.8 -  Total Bilirubin 0.3 - 1.2 mg/dL - 0.6 -  Alkaline Phos 38 - 126 U/L - 117 -  AST 15 - 41 U/L - 20 -  ALT 14 - 54 U/L - 13(L) -    Lipid Panel     Component Value Date/Time   CHOL 167 12/30/2017 1004   TRIG 53 12/30/2017 1004   HDL 61 12/30/2017 1004   CHOLHDL 2.7 12/30/2017 1004   LDLCALC 95 12/30/2017 1004    CBC    Component Value Date/Time   WBC 6.9 12/30/2017 1004   WBC 11.9 (H) 07/19/2017 0553   RBC 4.62 12/30/2017 1004   RBC 2.89 (L) 07/19/2017 0553   HGB 13.6 12/30/2017 1004   HCT 41.3 12/30/2017 1004   PLT 259 12/30/2017 1004   MCV 89 12/30/2017 1004   MCH 29.4 12/30/2017 1004   MCH 28.0 07/19/2017 0553   MCHC 32.9 12/30/2017 1004   MCHC 32.5 07/19/2017 0553   RDW 14.7 12/30/2017 1004   LYMPHSABS 2.9 12/30/2017 1004   EOSABS 0.1 12/30/2017 1004   BASOSABS 0.0 12/30/2017 1004    No results found for: HGBA1C  Assessment & Plan:   1. Morbid obesity ([place] Counseled on reducing portion sizes and increasing physical activity  2. Acute non-recurrent sinusitis of other sinus Placed on  Flonase, antihistamine Nasal irrigation will be beenficial  3. Essential hypertension Controlled Counseled on blood pressure goal of less than 130/80, low-sodium, DASH diet, medication compliance, 150 minutes of moderate intensity exercise per week. Discussed medication compliance, adverse effects. - amLODipine (NORVASC) 10 MG tablet; Take 1 tablet (10 mg total) by mouth daily.  Dispense: 90 tablet; Refill: 1  4. Screening for viral disease In the light of the ongoing Pandemic I will need to exclude  COVID-19 - cetirizine (ZYRTEC ALLERGY) 10 MG tablet; Take 1 tablet (10 mg total) by mouth daily.  Dispense: 30 tablet; Refill: 1 - Novel Coronavirus, NAA ([facility]); Future    Meds ordered this encounter  Medications  . amLODipine (NORVASC) 10 MG tablet    Sig: Take 1 tablet (10 mg total) by mouth daily.    Dispense:  90 tablet    Refill:  1  . cetirizine (ZYRTEC ALLERGY) 10 MG tablet    Sig: Take 1 tablet (10 mg total) by mouth daily.    Dispense:  30 tablet    Refill:  1  . fluticasone (FLONASE) 50 MCG/ACT nasal spray    Sig: Place 1 spray into both nostrils daily.    Dispense:  16 g    Refill:  1    Follow-up: Return for Medical conditions, keep previously scheduled appointment.       Hoy RegisterEnobong Draycen Leichter, MD, FAAFP. [facility] [place], [place] [phone]   02/15/2019, 10:47 AM

## 2019-02-16 ENCOUNTER — Encounter: Payer: Self-pay | Admitting: Family Medicine

## 2019-02-17 LAB — NOVEL CORONAVIRUS, NAA: SARS-CoV-2, NAA: NOT DETECTED

## 2019-03-21 ENCOUNTER — Other Ambulatory Visit (HOSPITAL_COMMUNITY)
Admission: RE | Admit: 2019-03-21 | Discharge: 2019-03-21 | Disposition: A | Discharge status: Home / Self Care | Payer: Medicaid Other | Source: Ambulatory Visit | Attending: Family Medicine | Admitting: Family Medicine

## 2019-03-21 ENCOUNTER — Encounter: Payer: Self-pay | Admitting: Family Medicine

## 2019-03-21 ENCOUNTER — Ambulatory Visit: Payer: Medicaid Other | Attending: Family Medicine | Admitting: Family Medicine

## 2019-03-21 ENCOUNTER — Other Ambulatory Visit: Payer: Self-pay

## 2019-03-21 VITALS — BP 122/78 | HR 76 | Temp 98.3°F | Ht 62.0 in | Wt 225.0 lb

## 2019-03-21 DIAGNOSIS — Z79899 Other long term (current) drug therapy: Secondary | ICD-10-CM | POA: Diagnosis not present

## 2019-03-21 DIAGNOSIS — I10 Essential (primary) hypertension: Secondary | ICD-10-CM | POA: Diagnosis not present

## 2019-03-21 DIAGNOSIS — Z8249 Family history of ischemic heart disease and other diseases of the circulatory system: Secondary | ICD-10-CM | POA: Insufficient documentation

## 2019-03-21 DIAGNOSIS — N898 Other specified noninflammatory disorders of vagina: Secondary | ICD-10-CM | POA: Insufficient documentation

## 2019-03-21 NOTE — Patient Instructions (Signed)
Vaginitis Vaginitis is a condition in which the vaginal tissue swells and becomes red (inflamed). This condition is most often caused by a change in the normal balance of bacteria and yeast that live in the vagina. This change causes an overgrowth of certain bacteria or yeast, which causes the inflammation. There are different types of vaginitis, but the most common types are:  Bacterial vaginosis.  Yeast infection (candidiasis).  Trichomoniasis vaginitis. This is a sexually transmitted disease (STD).  Viral vaginitis.  Atrophic vaginitis.  Allergic vaginitis. What are the causes? The cause of this condition depends on the type of vaginitis. It can be caused by:  Bacteria (bacterial vaginosis).  Yeast, which is a fungus (yeast infection).  A parasite (trichomoniasis vaginitis).  A virus (viral vaginitis).  Low hormone levels (atrophic vaginitis). Low hormone levels can occur during pregnancy, breastfeeding, or after menopause.  Irritants, such as bubble baths, scented tampons, and feminine sprays (allergic vaginitis). Other factors can change the normal balance of the yeast and bacteria that live in the vagina. These include:  Antibiotic medicines.  Poor hygiene.  Diaphragms, vaginal sponges, spermicides, birth control pills, and intrauterine devices (IUD).  Sex.  Infection.  Uncontrolled diabetes.  A weakened defense (immune) system. What increases the risk? This condition is more likely to develop in women who:  Smoke.  Use vaginal douches, scented tampons, or scented sanitary pads.  Wear tight-fitting pants.  Wear thong underwear.  Use oral birth control pills or an IUD.  Have sex without a condom.  Have multiple sex partners.  Have an STD.  Frequently use the spermicide nonoxynol-9.  Eat lots of foods high in sugar.  Have uncontrolled diabetes.  Have low estrogen levels.  Have a weakened immune system from an immune disorder or medical  treatment.  Are pregnant or breastfeeding. What are the signs or symptoms? Symptoms vary depending on the cause of the vaginitis. Common symptoms include:  Abnormal vaginal discharge. ? The discharge is white, gray, or yellow with bacterial vaginosis. ? The discharge is thick, white, and cheesy with a yeast infection. ? The discharge is frothy and yellow or greenish with trichomoniasis.  A bad vaginal smell. The smell is fishy with bacterial vaginosis.  Vaginal itching, pain, or swelling.  Sex that is painful.  Pain or burning when urinating. Sometimes there are no symptoms. How is this diagnosed? This condition is diagnosed based on your symptoms and medical history. A physical exam, including a pelvic exam, will also be done. You may also have other tests, including:  Tests to determine the pH level (acidity or alkalinity) of your vagina.  A whiff test, to assess the odor that results when a sample of your vaginal discharge is mixed with a potassium hydroxide solution.  Tests of vaginal fluid. A sample will be examined under a microscope. How is this treated? Treatment varies depending on the type of vaginitis you have. Your treatment may include:  Antibiotic creams or pills to treat bacterial vaginosis and trichomoniasis.  Antifungal medicines, such as vaginal creams or suppositories, to treat a yeast infection.  Medicine to ease discomfort if you have viral vaginitis. Your sexual partner should also be treated.  Estrogen delivered in a cream, pill, suppository, or vaginal ring to treat atrophic vaginitis. If vaginal dryness occurs, lubricants and moisturizing creams may help. You may need to avoid scented soaps, sprays, or douches.  Stopping use of a product that is causing allergic vaginitis. Then using a vaginal cream to treat the symptoms. Follow   these instructions at home: Lifestyle  Keep your genital area clean and dry. Avoid soap, and only rinse the area with  water.  Do not douche or use tampons until your health care provider says it is okay to do so. Use sanitary pads, if needed.  Do not have sex until your health care provider approves. When you can return to sex, practice safe sex and use condoms.  Wipe from front to back. This avoids the spread of bacteria from the rectum to the vagina. General instructions  Take over-the-counter and prescription medicines only as told by your health care provider.  If you were prescribed an antibiotic medicine, take or use it as told by your health care provider. Do not stop taking or using the antibiotic even if you start to feel better.  Keep all follow-up visits as told by your health care provider. This is important. How is this prevented?  Use mild, non-scented products. Do not use things that can irritate the vagina, such as fabric softeners. Avoid the following products if they are scented: ? Feminine sprays. ? Detergents. ? Tampons. ? Feminine hygiene products. ? Soaps or bubble baths.  Let air reach your genital area. ? Wear cotton underwear to reduce moisture buildup. ? Avoid wearing underwear while you sleep. ? Avoid wearing tight pants and underwear or nylons without a cotton panel. ? Avoid wearing thong underwear.  Take off any wet clothing, such as bathing suits, as soon as possible.  Practice safe sex and use condoms. Contact a health care provider if:  You have abdominal pain.  You have a fever.  You have symptoms that last for more than 2-3 days. Get help right away if:  You have a fever and your symptoms suddenly get worse. Summary  Vaginitis is a condition in which the vaginal tissue becomes inflamed.This condition is most often caused by a change in the normal balance of bacteria and yeast that live in the vagina.  Treatment varies depending on the type of vaginitis you have.  Do not douche, use tampons , or have sex until your health care provider approves. When  you can return to sex, practice safe sex and use condoms. This information is not intended to replace advice given to you by your health care provider. Make sure you discuss any questions you have with your health care provider. Document Released: 02/22/2007 Document Revised: 04/09/2017 Document Reviewed: 06/02/2016 Elsevier Patient Education  2020 [employer]  

## 2019-03-21 NOTE — Progress Notes (Signed)
Subjective:  Patient ID: Krystal Chan, female    DOB: 1985-08-08  Age: 33 y.o. MRN: [id number]  CC: Hypertension   HPI Krystal Chan presents with vaginal discharge and vaginal itching which have been present for the last couple of days.  She has 1 sexual partner who is the father of her children and does not use protection during sexual intercourse. Denies dysuria, abdominal pain, fever. Pap smear is not due until 01/2020.  Past Medical History:  Diagnosis Date  . History of preterm delivery, currently pregnant 02/28/2015   [ ] 17-P  . Hypertension   . Umbilical hernia    repaired ~2004    Past Surgical History:  Procedure Laterality Date  . CESAREAN SECTION MULTI-GESTATIONAL N/A 07/14/2017   Procedure: CESAREAN SECTION MULTI-GESTATIONAL;  Surgeon: Truett Mainland, DO;  Location: [facility];  Service: Obstetrics;  Laterality: N/A;  . HERNIA REPAIR     gastic hernia 1062 and umbilical hernia 6948  . LAPAROTOMY N/A 07/17/2017   Procedure: Exploratory Laparottomy, Evacuation of Abdominal Wall Hematoma;  Surgeon: Donnamae Jude, MD;  Location: [facility];  Service: Gynecology;  Laterality: N/A;    Family History  Problem Relation Age of Onset  . Breast cancer Maternal Grandmother   . Hypertension Maternal Grandmother   . Cancer Maternal Grandmother        breast  . Hypertension Mother   . Heart disease Mother   . Hypertension Paternal Grandmother   . Diabetes Paternal Grandmother   . Diabetes Father   . Hearing loss Neg Hx     No Known Allergies  Outpatient Medications Prior to Visit  Medication Sig Dispense Refill  . amLODipine (NORVASC) 10 MG tablet Take 1 tablet (10 mg total) by mouth daily. 90 tablet 1  . benzonatate (TESSALON) 100 MG capsule Take 1 capsule (100 mg total) by mouth every 8 (eight) hours. (Patient not taking: Reported on 02/15/2019) 21 capsule 0  . cetirizine (ZYRTEC ALLERGY) 10 MG tablet Take 1 tablet (10 mg total) by mouth daily. 30 tablet 1  .  fluticasone (FLONASE) 50 MCG/ACT nasal spray Place 1 spray into both nostrils daily. (Patient not taking: Reported on 03/21/2019) 16 g 1   No facility-administered medications prior to visit.      ROS Review of Systems  Constitutional: Negative for activity change, appetite change and fatigue.  HENT: Negative for congestion, sinus pressure and sore throat.   Eyes: Negative for visual disturbance.  Respiratory: Negative for cough, chest tightness, shortness of breath and wheezing.   Cardiovascular: Negative for chest pain and palpitations.  Gastrointestinal: Negative for abdominal distention, abdominal pain and constipation.  Endocrine: Negative for polydipsia.  Genitourinary: Negative for dysuria and frequency.  Musculoskeletal: Negative for arthralgias and back pain.  Skin: Negative for rash.  Neurological: Negative for tremors, light-headedness and numbness.  Hematological: Does not bruise/bleed easily.  Psychiatric/Behavioral: Negative for agitation and behavioral problems.    Objective:  BP 122/78   Pulse 76   Temp 98.3 F (36.8 C) (Oral)   Ht 5\' 2"  (1.575 m)   Wt 225 lb (102.1 kg)   SpO2 100%   BMI 41.15 kg/m   BP/Weight 03/21/2019 02/15/2019 5/46/2703  Systolic BP 500 938 182  Diastolic BP 78 74 84  Wt. (Lbs) 225 223 226  BMI 41.15 40.79 41.34      Physical Exam Constitutional:      Appearance: She is well-developed.  Neck:     Vascular: No JVD.  Cardiovascular:  Rate and Rhythm: Normal rate.     Heart sounds: Normal heart sounds. No murmur.  Pulmonary:     Effort: Pulmonary effort is normal.     Breath sounds: Normal breath sounds. No wheezing or rales.  Chest:     Chest wall: No tenderness.  Abdominal:     General: Bowel sounds are normal. There is no distension.     Palpations: Abdomen is soft. There is no mass.     Tenderness: There is no abdominal tenderness.  Genitourinary:    General: Normal vulva.     Vagina: Vaginal discharge present.   Musculoskeletal: Normal range of motion.     Right lower leg: No edema.     Left lower leg: No edema.  Neurological:     Mental Status: She is alert and oriented to person, place, and time.  Psychiatric:        Mood and Affect: Mood normal.     CMP Latest Ref Rng & Units 12/30/2017 07/14/2017 04/22/2015  Glucose 65 - 99 mg/dL 89 95 87  BUN 6 - 20 mg/dL 11 5(L) 6  Creatinine 0.93 - 1.00 mg/dL 2.35 5.73(U) 2.02  Sodium 134 - 144 mmol/L 142 134(L) 137  Potassium 3.5 - 5.2 mmol/L 3.8 3.5 3.8  Chloride 96 - 106 mmol/L 104 104 104  CO2 20 - 29 mmol/L 24 20(L) 24  Calcium 8.7 - 10.2 mg/dL 9.2 5.4(Y) 9.0  Total Protein 6.5 - 8.1 g/dL - 6.8 -  Total Bilirubin 0.3 - 1.2 mg/dL - 0.6 -  Alkaline Phos 38 - 126 U/L - 117 -  AST 15 - 41 U/L - 20 -  ALT 14 - 54 U/L - 13(L) -    Lipid Panel     Component Value Date/Time   CHOL 167 12/30/2017 1004   TRIG 53 12/30/2017 1004   HDL 61 12/30/2017 1004   CHOLHDL 2.7 12/30/2017 1004   LDLCALC 95 12/30/2017 1004    CBC    Component Value Date/Time   WBC 6.9 12/30/2017 1004   WBC 11.9 (H) 07/19/2017 0553   RBC 4.62 12/30/2017 1004   RBC 2.89 (L) 07/19/2017 0553   HGB 13.6 12/30/2017 1004   HCT 41.3 12/30/2017 1004   PLT 259 12/30/2017 1004   MCV 89 12/30/2017 1004   MCH 29.4 12/30/2017 1004   MCH 28.0 07/19/2017 0553   MCHC 32.9 12/30/2017 1004   MCHC 32.5 07/19/2017 0553   RDW 14.7 12/30/2017 1004   LYMPHSABS 2.9 12/30/2017 1004   EOSABS 0.1 12/30/2017 1004   BASOSABS 0.0 12/30/2017 1004    No results found for: HGBA1C  Assessment & Plan:   1. Vaginal discharge Safe sexual precautions We will treat according to cervicovaginal ancillary results - Cervicovaginal ancillary only   Health Care Maintenance: Pap smear due in 01/2020 No orders of the defined types were placed in this encounter.   Follow-up: Return if symptoms worsen or fail to improve.       [name], MD, FAAFP. [facility] [place], [place] [phone]   03/21/2019, 3:48 PM

## 2019-03-21 NOTE — Progress Notes (Signed)
Patient is needing STD testing.  Patient is having vaginal discharge with itching.

## 2019-03-22 LAB — CERVICOVAGINAL ANCILLARY ONLY
Bacterial Vaginitis (gardnerella): POSITIVE — AB
Candida Glabrata: NEGATIVE
Candida Vaginitis: NEGATIVE
Chlamydia: NEGATIVE
Comment: NEGATIVE
Comment: NEGATIVE
Comment: NEGATIVE
Comment: NEGATIVE
Comment: NEGATIVE
Comment: NORMAL
Neisseria Gonorrhea: NEGATIVE
Trichomonas: POSITIVE — AB

## 2019-03-23 ENCOUNTER — Other Ambulatory Visit: Payer: Self-pay | Admitting: Family Medicine

## 2019-03-23 MED ORDER — METRONIDAZOLE 500 MG PO TABS: 2000.0000 mg | ORAL_TABLET | Freq: Once | ORAL | 0 refills | Status: AC

## 2019-04-03 ENCOUNTER — Encounter: Payer: Medicaid Other | Admitting: Family Medicine

## 2019-05-31 IMAGING — US US MFM OB FOLLOW-UP EACH ADDL GEST (MODIFY)
1 series · 12 of 28 positions shown · non-contrast
Comparison: none

[Series 1: us mfm ob follow-up each addl gest (modify) · 12 of 87 slices shown]
[im 4/87]
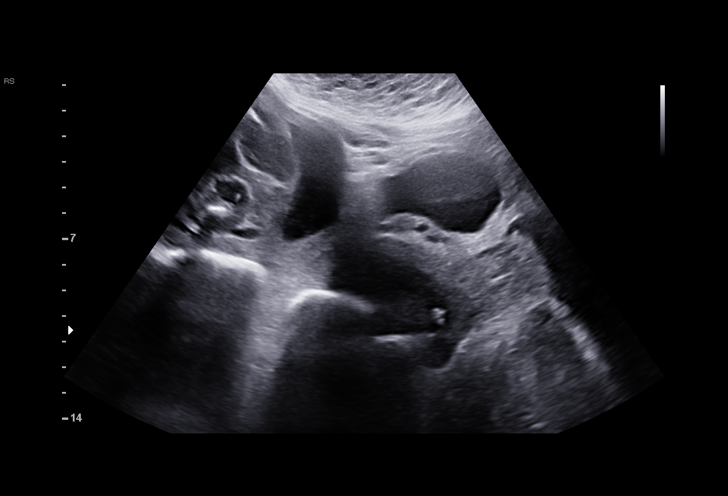
[im 10/87]
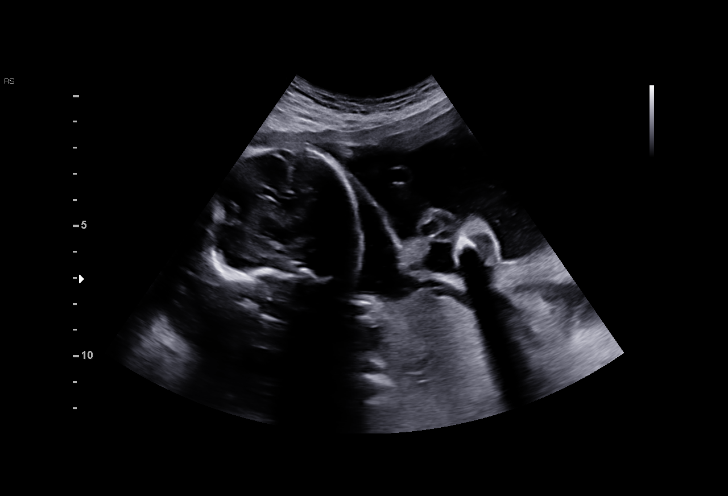
[im 16/87]
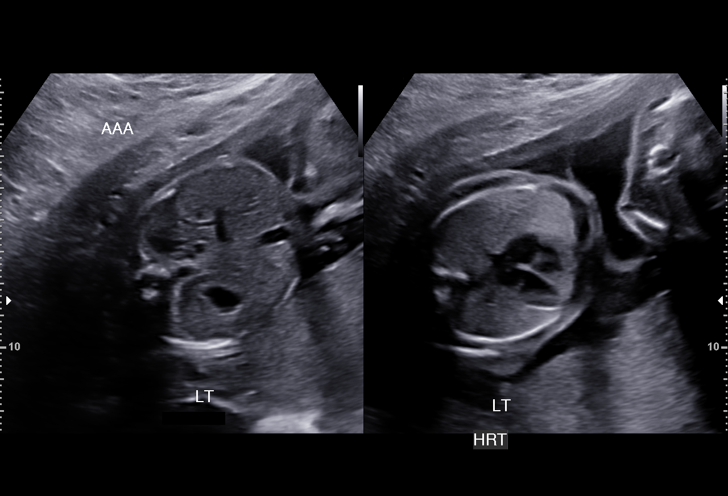
[im 26/87]
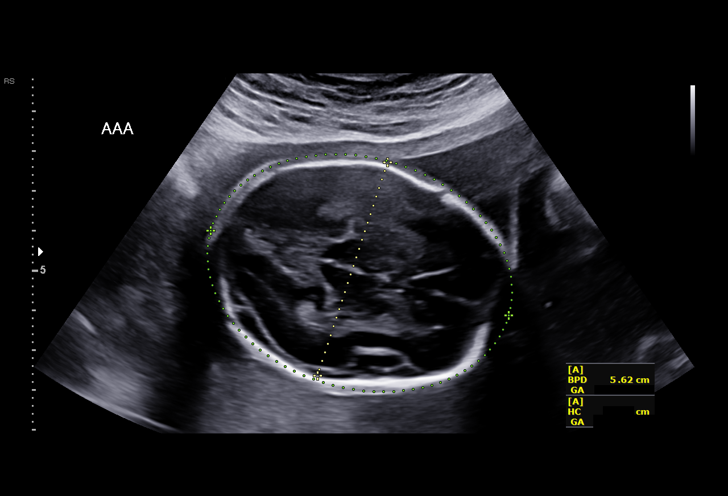
[im 32/87]
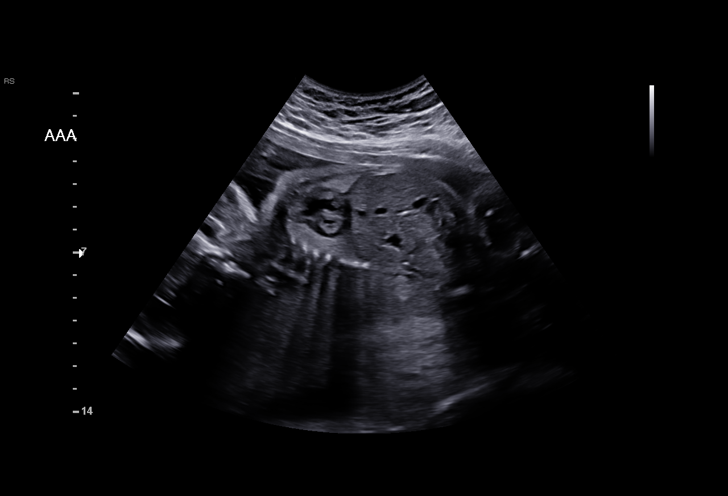
[im 39/87]
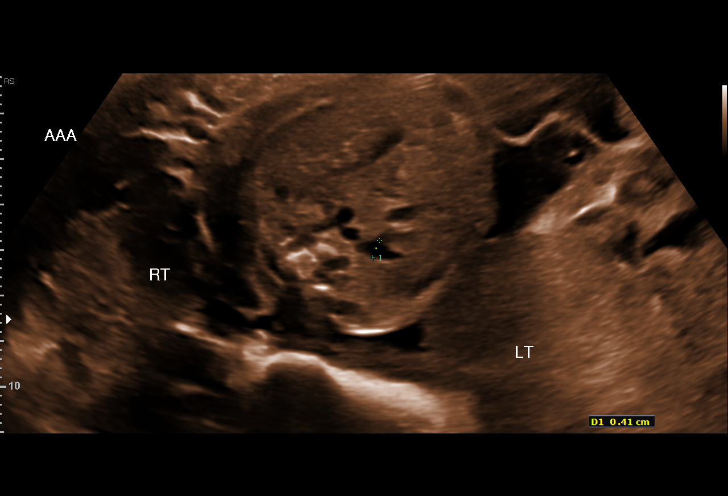
[im 48/87]
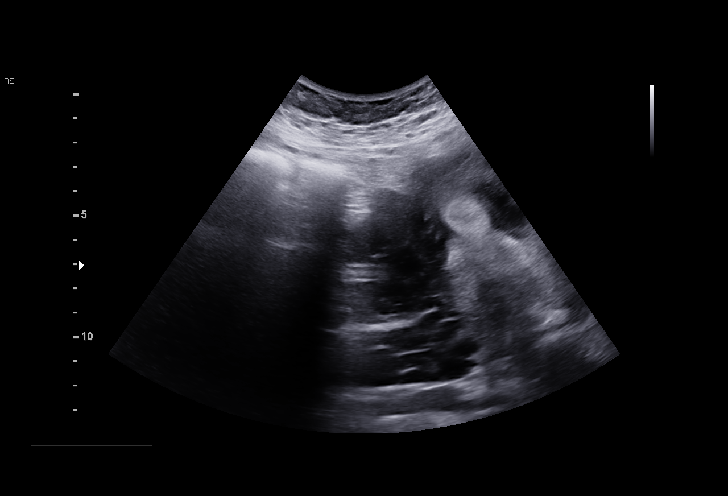
[im 55/87]
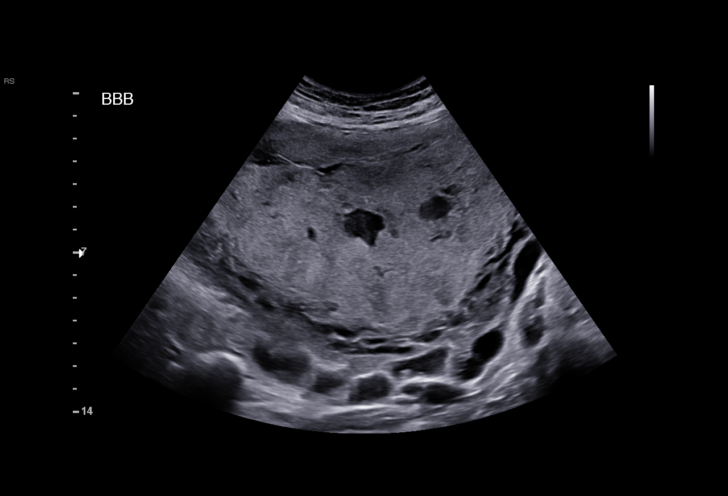
[im 61/87]
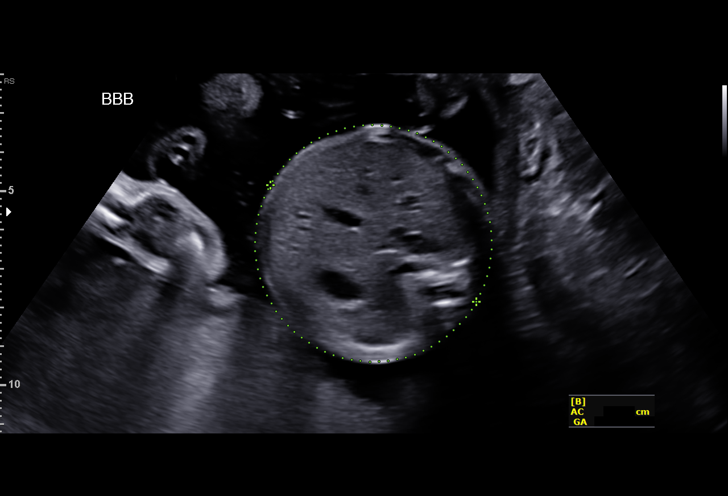
[im 71/87]
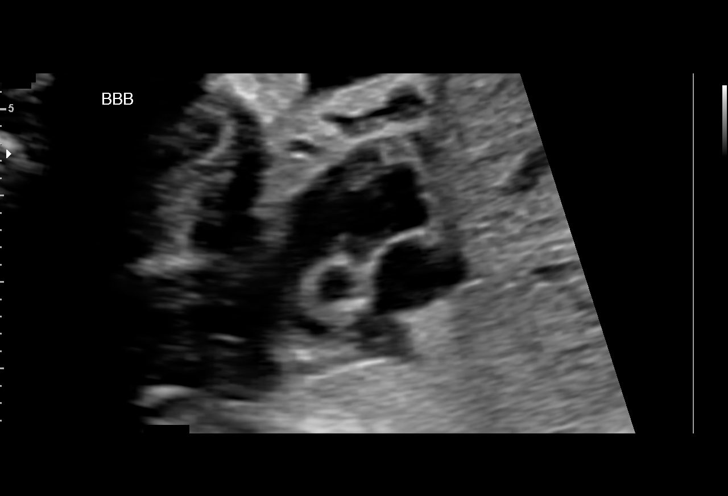
[im 77/87]
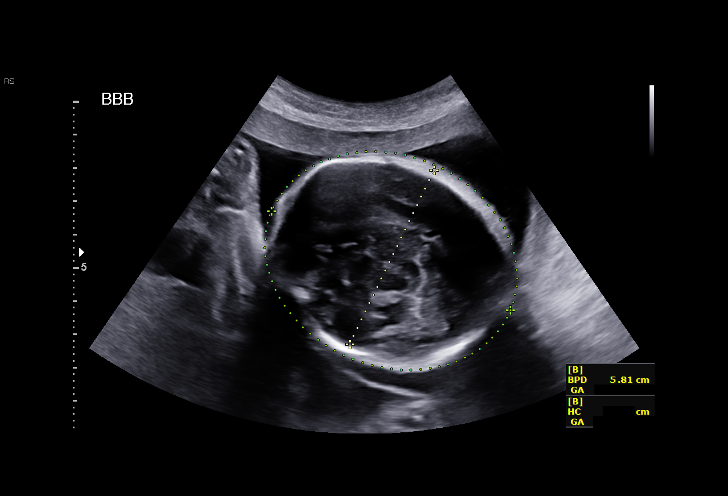
[im 83/87]
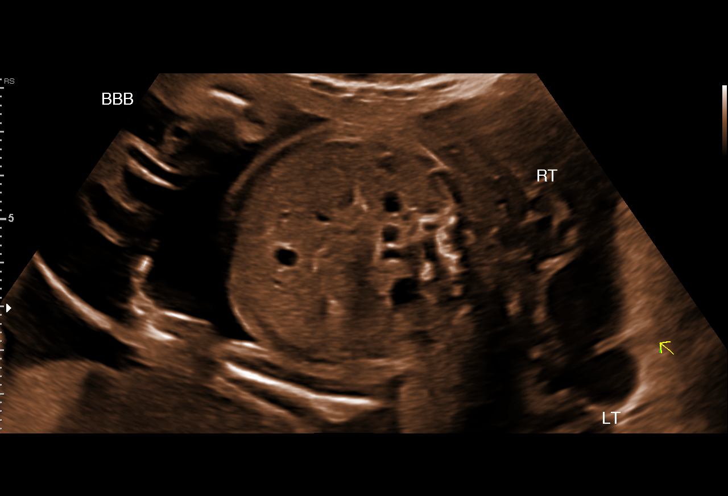

[12 of 28 positions shown; findings below may reference images not displayed]

OB/Gyn Clinic

1  YUL PHIL            [id number]      [id number]     [id number]
2  YUL PHIL            [id number]      [id number]     [id number]
Indications

23 weeks gestation of pregnancy
Twin pregnancy, di/di, second trimester
Obesity complicating pregnancy, second
trimester (pregravid BMI 35.9)
Poor obstetric history: Previous preterm
delivery, antepartum @ 85w8d, term birth
since, on [facility]
Encounter for other antenatal screening
follow-up
OB History

Blood Type:   A+       Height:  5'1"   Weight (lb):  190       BMI:
lb
Gravidity:    6         Term:   2        Prem:   1        SAB:   2
Living:       3
Fetal Evaluation (Fetus A)

Num Of Fetuses:     2
Fetal Heart         158
Rate(bpm):
Cardiac Activity:   Observed
Fetal Lie:          Maternal right side
Presentation:       Breech
Placenta:           Posterior, above cervical os
P. Cord Insertion:  Not well visualized
Membrane Desc:      Dividing Membrane seen - Dichorionic.

Amniotic Fluid
AFI FV:      Subjectively within normal limits

Largest Pocket(cm)
6.80
Biometry (Fetus A)

BPD:      56.4  mm     G. Age:  23w 2d         48  %    CI:        69.42   %    70 - 86
FL/HC:      18.5   %    19.2 -
HC:      216.1  mm     G. Age:  23w 4d         55  %    HC/AC:      1.12        1.05 -
AC:      192.3  mm     G. Age:  24w 0d         67  %    FL/BPD:     70.7   %    71 - 87
FL:       39.9  mm     G. Age:  22w 6d         30  %    FL/AC:      20.7   %    20 - 24
HUM:      38.2  mm     G. Age:  23w 4d         49  %
Est. FW:     599  gm      1 lb 5 oz     58  %     FW Discordancy         5  %
Gestational Age (Fetus A)

LMP:           23w 1d        Date:  11/09/16                 EDD:   08/16/17
U/S Today:     23w 3d                                        EDD:   08/14/17
Best:          23w 1d     Det. By:  LMP  (11/09/16)          EDD:   08/16/17
Anatomy (Fetus A)

Cranium:               Appears normal         Aortic Arch:            Appears normal
Cavum:                 Previously seen        Ductal Arch:            Previously seen
Ventricles:            Appears normal         Diaphragm:              Previously seen
Choroid Plexus:        Previously seen        Stomach:                Appears normal, left
sided
Cerebellum:            Previously seen        Abdomen:                Appears normal
Posterior Fossa:       Previously seen        Abdominal Wall:         Previously seen
Nuchal Fold:           Not applicable (>20    Cord Vessels:           Previously seen
wks GA)
Face:                  Orbits and profile     Kidneys:                Right sided
previously seen
pyelectasis, 4.9mm
Lips:                  Previously seen        Bladder:                Appears normal
Thoracic:              Appears normal         Spine:                  Previously seen
Heart:                 Appears normal         Upper Extremities:      Previously seen
(4CH, axis, and
situs)
RVOT:                  Previously seen        Lower Extremities:      Previously seen
LVOT:                  Appears normal

Other:  Fetus appears to be a female. Rt heel previously visualized. Nasal
bone previously visualized. Technically difficult due to maternal
habitus and fetal position.

Fetal Evaluation (Fetus B)

Num Of Fetuses:     2
Fetal Heart         149
Rate(bpm):
Cardiac Activity:   Observed
Fetal Lie:          Maternal left side
Presentation:       Transverse, head to maternal right
Placenta:           Posterior, above cervical os
P. Cord Insertion:  Previously Visualized
Membrane Desc:      Dividing Membrane seen - Dichorionic.
Amniotic Fluid
AFI FV:      Subjectively within normal limits

Largest Pocket(cm)
5.94
Biometry (Fetus B)

BPD:      57.6  mm     G. Age:  23w 4d         64  %    CI:        69.21   %    70 - 86
FL/HC:      18.8   %    19.2 -
HC:      221.1  mm     G. Age:  24w 1d         74  %    HC/AC:      1.15        1.05 -
AC:       193   mm     G. Age:  24w 0d         69  %    FL/BPD:     72.0   %    71 - 87
FL:       41.5  mm     G. Age:  23w 4d         50  %    FL/AC:      21.5   %    20 - 24
HUM:      38.4  mm     G. Age:  23w 4d         51  %

Est. FW:     631  gm      1 lb 6 oz     64  %     FW Discordancy      0 \ 5 %
Gestational Age (Fetus B)

LMP:           23w 1d        Date:  11/09/16                 EDD:   08/16/17
U/S Today:     23w 6d                                        EDD:   08/11/17
Best:          23w 1d     Det. By:  LMP  (11/09/16)          EDD:   08/16/17
Anatomy (Fetus B)

Cranium:               Appears normal         Aortic Arch:            Previously seen
Cavum:                 Previously seen        Ductal Arch:            Previously seen
Ventricles:            Appears normal         Diaphragm:              Previously seen
Choroid Plexus:        Previously seen        Stomach:                Appears normal, left
sided
Cerebellum:            Previously seen        Abdomen:                Appears normal
Posterior Fossa:       Previously seen        Abdominal Wall:         Previously seen
Nuchal Fold:           Not applicable (>20    Cord Vessels:           Previously seen
wks GA)
Face:                  Orbits and profile     Kidneys:                Appear normal
previously seen
Lips:                  Previously seen        Bladder:                Appears normal
Thoracic:              Appears normal         Spine:                  Previously seen
Heart:                 Appears normal         Upper Extremities:      Previously seen
(4CH, axis, and situs
RVOT:                  Appears normal         Lower Extremities:      Previously seen
LVOT:                  Appears normal

Other:  Female gender previously seen.. Nasal bone previously visualized.
Technically difficult due to maternal habitus and fetal position.
Cervix Uterus Adnexa

Cervix
Length:            3.8  cm.
Normal appearance by transabdominal scan.

Uterus
No abnormality visualized.

Left Ovary
Not visualized.

Right Ovary
Not visualized.

Adnexa:       No abnormality visualized. No adnexal mass
visualized.
Impression

Dichorionic/diamniotic twin pregnancy at 23+1 weeks
Normal detailed fetal anatomy in both twins. there is some
borderline pyelectasis (4.9mm on the right) seen today
All relevant fetal anatomy has been seen in both twins
Normal amniotic fluid volume in both twins
Measurements show growth in the 58th percentile for A and
64th percentile for B. Discordance is 5%
Recommendations

Repeat scan for growth and renal evaluation in 4 weeks

## 2019-06-20 ENCOUNTER — Encounter: Payer: Self-pay | Admitting: *Deleted

## 2019-07-17 ENCOUNTER — Ambulatory Visit: Payer: Medicaid Other | Attending: Internal Medicine

## 2019-07-17 DIAGNOSIS — Z20822 Contact with and (suspected) exposure to covid-19: Secondary | ICD-10-CM

## 2019-07-18 LAB — NOVEL CORONAVIRUS, NAA: SARS-CoV-2, NAA: NOT DETECTED

## 2019-08-27 IMAGING — CT CT ABD-PELV W/ CM
1 of 2 series · 15 of 32 positions shown, 19 images · IV contrast (OMNIPAQUE)
Comparison: None.

CLINICAL DATA: Abdominal distention, anemia requiring blood
transfusions. Status post C-section, postop day 3.

EXAM:
CT ABDOMEN AND PELVIS WITH CONTRAST
TECHNIQUE: Multidetector CT imaging of the abdomen and pelvis was performed
using the standard protocol following bolus administration of
intravenous contrast.
CONTRAST:  100mL WZRZQG-3MM IOPAMIDOL (WZRZQG-3MM) INJECTION 61%

[Series 2: routine abdomen/pelvis with · axial · 0.94mm/px · z∈[-448,-58]mm · 15 of 86 slices shown, 19 images]
[im 4/86  soft-tissue]
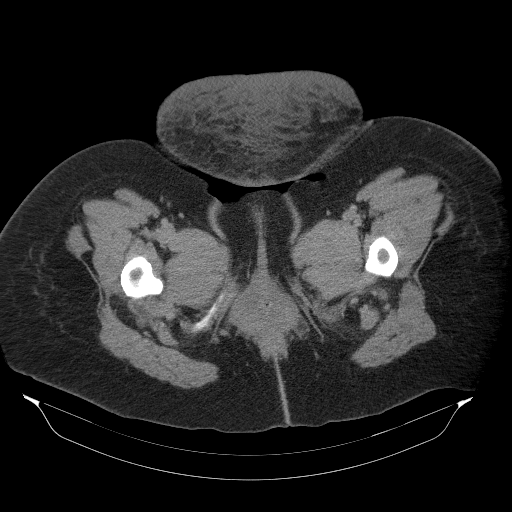
[im 4/86  bone]
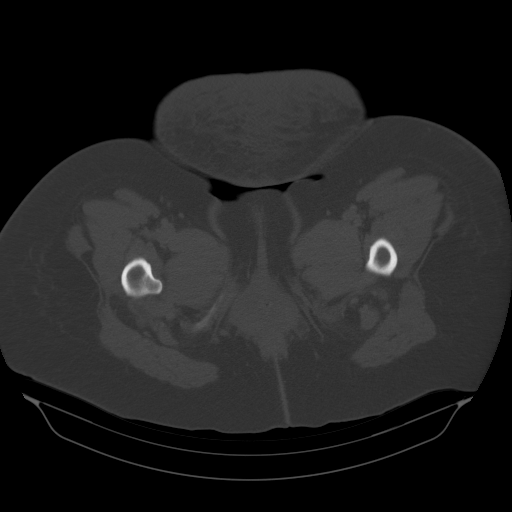
[im 10/86  soft-tissue]
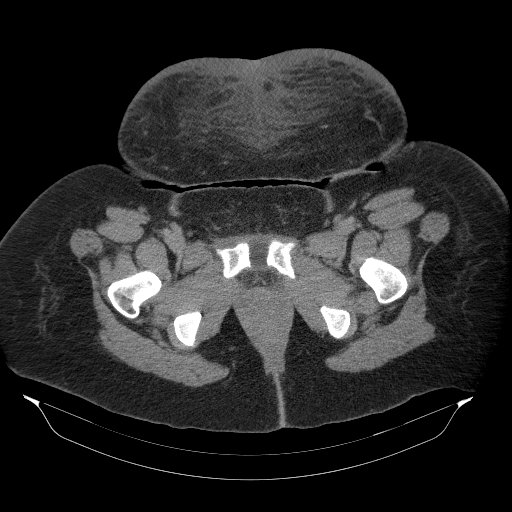
[im 17/86  soft-tissue]
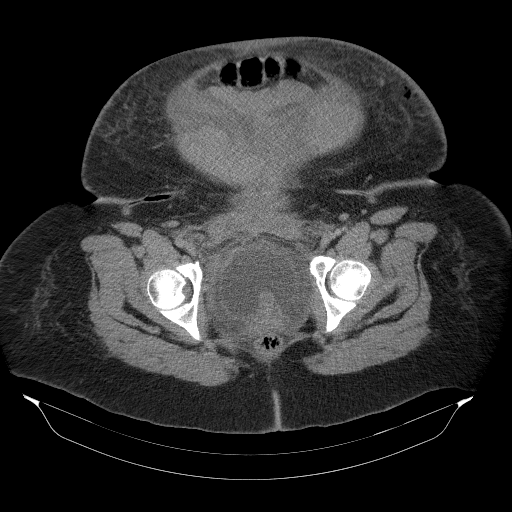
[im 23/86  soft-tissue]
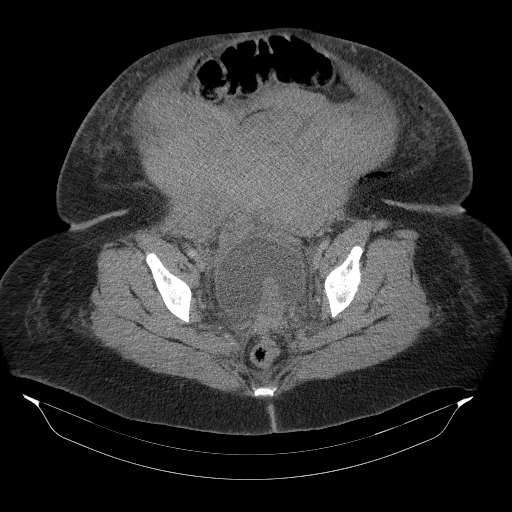
[im 30/86  soft-tissue]
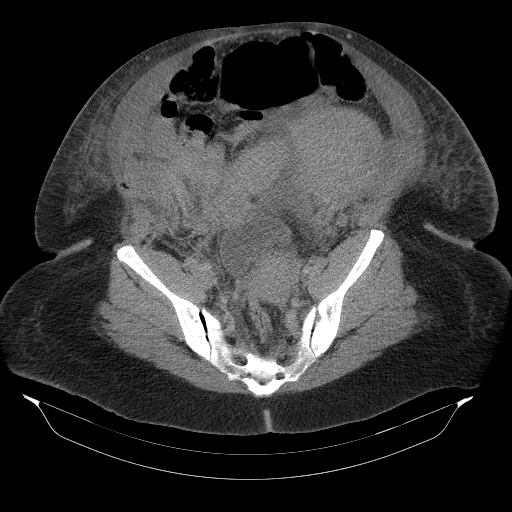
[im 36/86  soft-tissue]
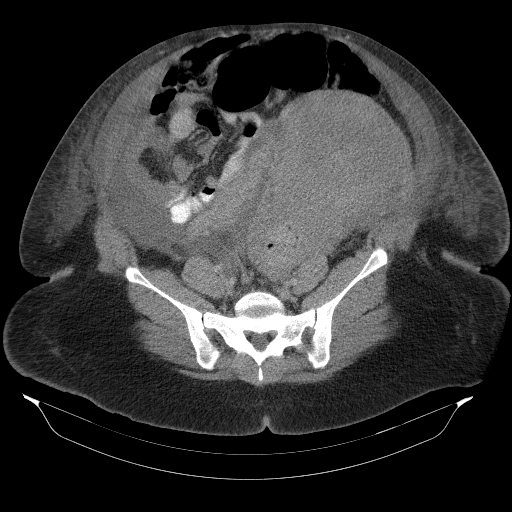
[im 43/86  soft-tissue]
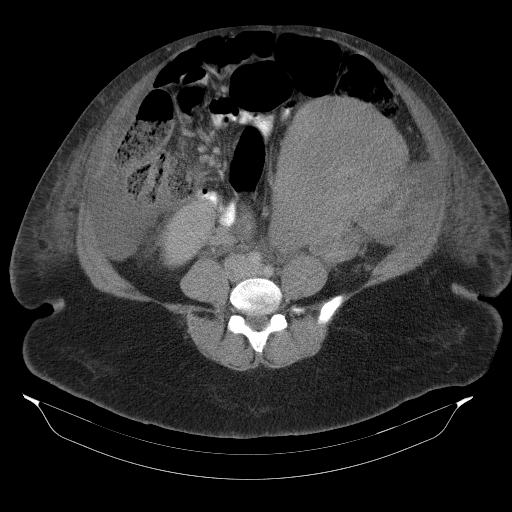
[im 50/86  soft-tissue]
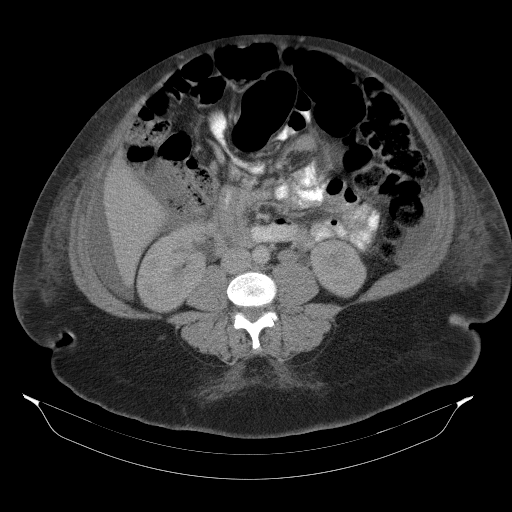
[im 56/86  soft-tissue]
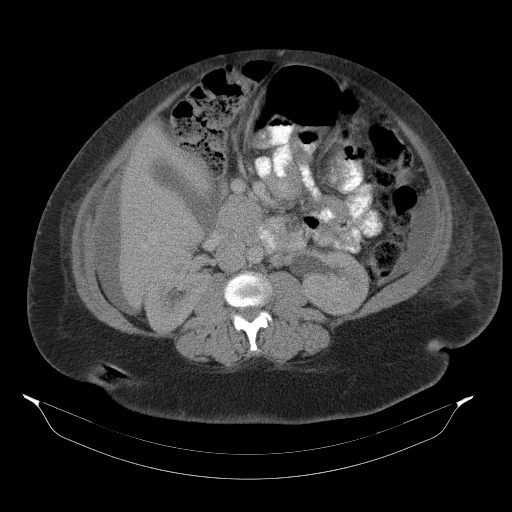
[im 56/86  bone]
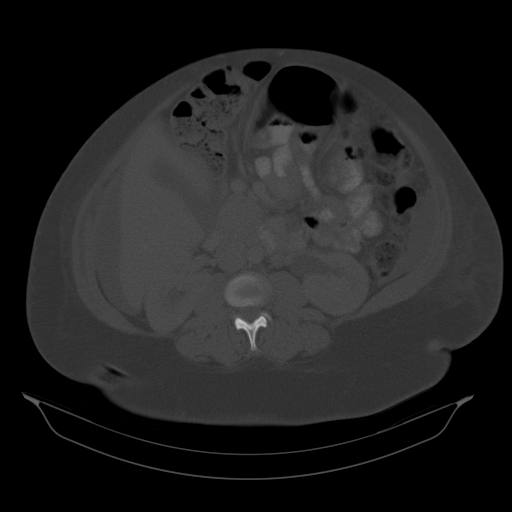
[im 63/86  soft-tissue]
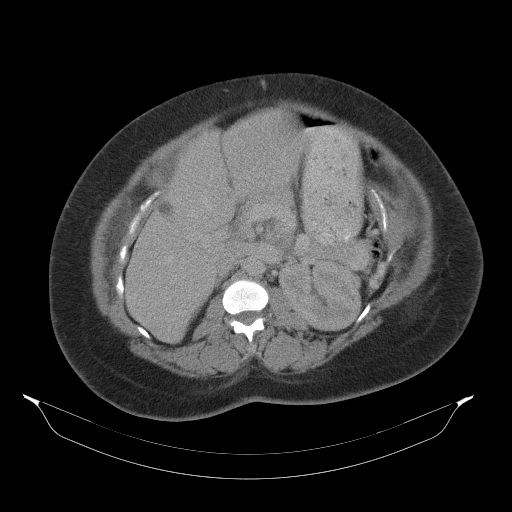
[im 69/86  soft-tissue]
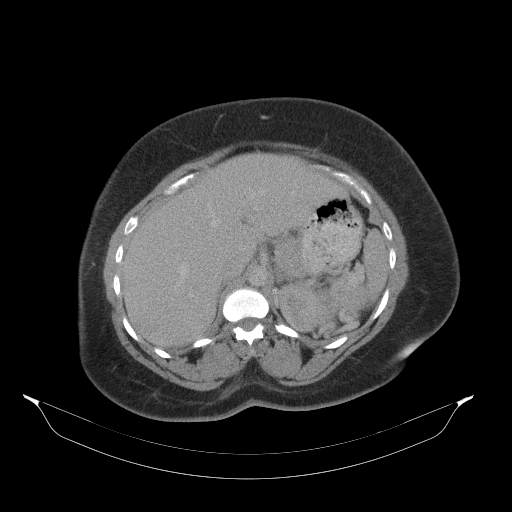
[im 72/86  lung]
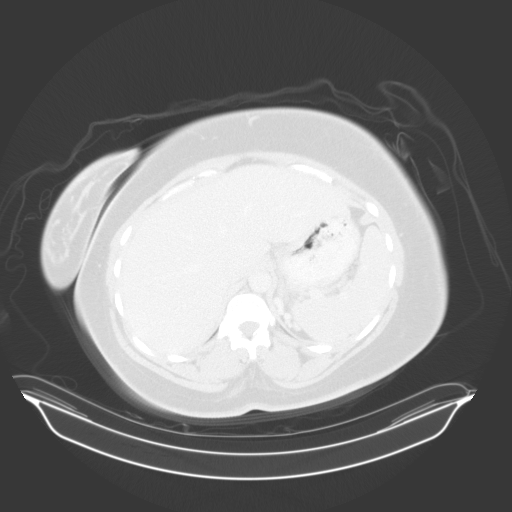
[im 76/86  soft-tissue]
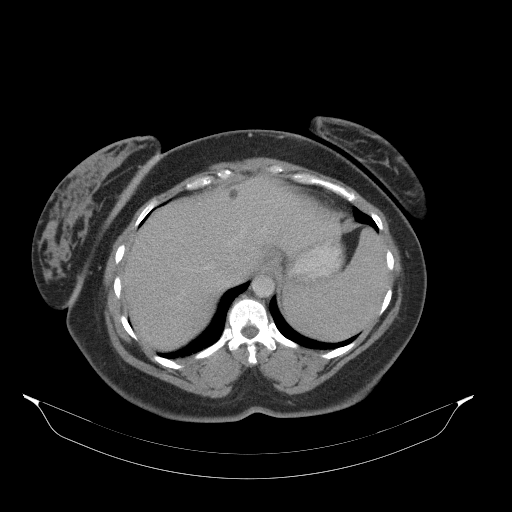
[im 76/86  lung]
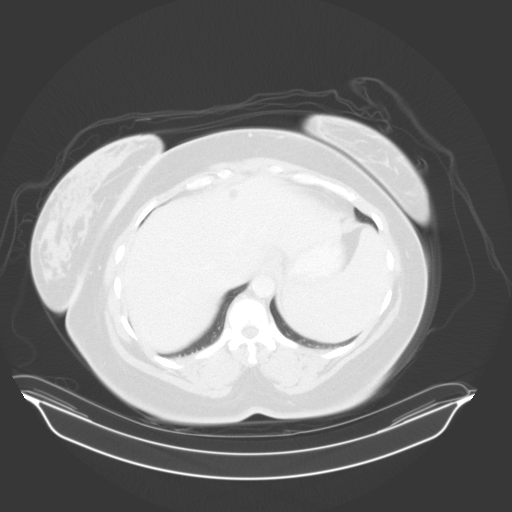
[im 79/86  lung]
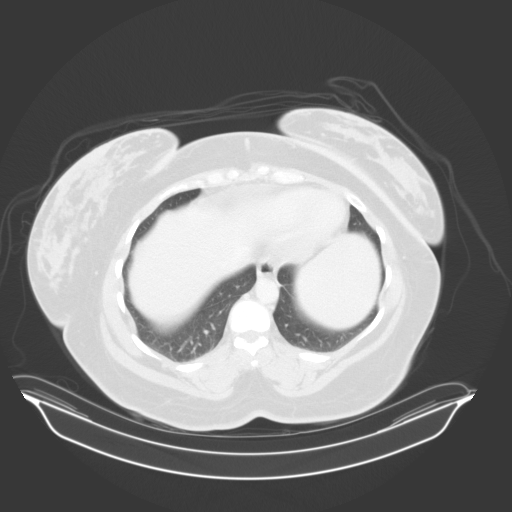
[im 82/86  soft-tissue]
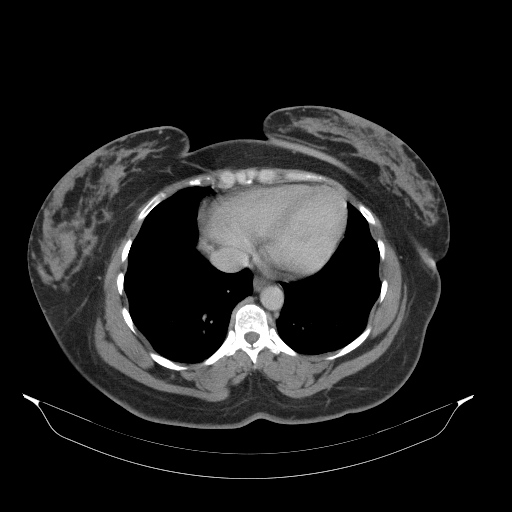
[im 82/86  lung]
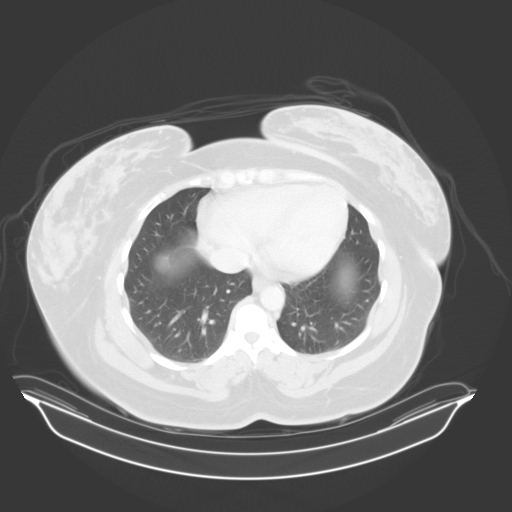

[15 of 32 positions shown; findings below may reference images not displayed]

FINDINGS: Lower chest: Lung bases are clear. No effusions. Heart is normal
size.

Hepatobiliary: Small hypodensities in the liver, the largest 2.1 cm
in the right hepatic lobe, likely small cysts. Gallbladder
unremarkable.

Pancreas: No focal abnormality or ductal dilatation.

Spleen: No focal abnormality. Normal size. Prominent vessels noted
about the spleen. The splenic vein appears mildly enlarged but
patent.

Adrenals/Urinary Tract: No adrenal abnormality. No focal renal
abnormality. No stones or hydronephrosis. Urinary bladder is
unremarkable.

Stomach/Bowel: Gas throughout mildly prominent large and small bowel
loops, possibly mild ileus. No evidence of bowel obstruction.

Vascular/Lymphatic: No evidence of aneurysm or adenopathy.

Reproductive: Uterus is enlarged compatible with recent postpartum
state. There is gas noted within the endometrium, most likely
related to recent postpartum state.

Other: Large lower anterior abdominal wall hematoma noted within the
musculature in the anterior lower pelvis. This is best seen on
sagittal image 125 measuring 14.5 x 8.1 cm. This extends
transversely approximately 24 cm on axial image 65, series 2. Small
to moderate free fluid noted around the liver and spleen and in the
paracolic gutters.

Musculoskeletal: No acute bony abnormality.
IMPRESSION: Large lower anterior abdominal wall hematoma, 24 x 14.5 x 8.1 cm in
the anterior lower abdominal wall musculature.

Enlarged uterus compatible with postpartum state. Gas noted within
the endometrium, likely related to recent postpartum state although
endometritis could have a similar appearance.

Moderate free fluid in the abdomen and pelvis. This measures water
density.

Small scattered cysts in the liver.

## 2019-10-30 ENCOUNTER — Other Ambulatory Visit: Payer: Self-pay

## 2019-10-30 ENCOUNTER — Encounter: Payer: Self-pay | Admitting: Family Medicine

## 2019-10-30 ENCOUNTER — Other Ambulatory Visit (HOSPITAL_COMMUNITY)
Admission: RE | Admit: 2019-10-30 | Discharge: 2019-10-30 | Disposition: A | Discharge status: Home / Self Care | Payer: Medicaid Other | Source: Ambulatory Visit | Attending: Family Medicine | Admitting: Family Medicine

## 2019-10-30 ENCOUNTER — Ambulatory Visit: Payer: Medicaid Other | Attending: Family Medicine | Admitting: Family Medicine

## 2019-10-30 VITALS — BP 127/79 | HR 78 | Ht 62.0 in | Wt 233.6 lb

## 2019-10-30 DIAGNOSIS — Z113 Encounter for screening for infections with a predominantly sexual mode of transmission: Secondary | ICD-10-CM

## 2019-10-30 DIAGNOSIS — Z8249 Family history of ischemic heart disease and other diseases of the circulatory system: Secondary | ICD-10-CM | POA: Insufficient documentation

## 2019-10-30 DIAGNOSIS — K439 Ventral hernia without obstruction or gangrene: Secondary | ICD-10-CM | POA: Diagnosis not present

## 2019-10-30 DIAGNOSIS — K469 Unspecified abdominal hernia without obstruction or gangrene: Secondary | ICD-10-CM | POA: Insufficient documentation

## 2019-10-30 DIAGNOSIS — Z1159 Encounter for screening for other viral diseases: Secondary | ICD-10-CM | POA: Diagnosis not present

## 2019-10-30 DIAGNOSIS — Z0000 Encounter for general adult medical examination without abnormal findings: Secondary | ICD-10-CM

## 2019-10-30 DIAGNOSIS — Z124 Encounter for screening for malignant neoplasm of cervix: Secondary | ICD-10-CM | POA: Diagnosis not present

## 2019-10-30 DIAGNOSIS — I10 Essential (primary) hypertension: Secondary | ICD-10-CM | POA: Diagnosis not present

## 2019-10-30 DIAGNOSIS — Z79899 Other long term (current) drug therapy: Secondary | ICD-10-CM | POA: Insufficient documentation

## 2019-10-30 MED ORDER — AMLODIPINE BESYLATE 10 MG PO TABS: 10.0000 mg | ORAL_TABLET | Freq: Every day | ORAL | 1 refills | Status: DC

## 2019-10-30 NOTE — Progress Notes (Signed)
Subjective:  Patient ID: Krystal Chan, female    DOB: 09/20/1985  Age: 34 y.o. MRN: [id number]  CC: Annual Exam   HPI Krystal Chan presents for an annual physical exam. She complains of the presence of an abdominal hernia every time she tries to sit up but denies presence of pain, nausea or vomiting, constipation or diarrhea.  Past Medical History:  Diagnosis Date  . History of preterm delivery, currently pregnant 02/28/2015   [ ]17-P  . Hypertension   . Umbilical hernia    repaired ~2004    Past Surgical History:  Procedure Laterality Date  . CESAREAN SECTION MULTI-GESTATIONAL N/A 07/14/2017   Procedure: CESAREAN SECTION MULTI-GESTATIONAL;  Surgeon: Truett Mainland, DO;  Location: [facility];  Service: Obstetrics;  Laterality: N/A;  . HERNIA REPAIR     gastic hernia 7035 and umbilical hernia 0093  . LAPAROTOMY N/A 07/17/2017   Procedure: Exploratory Laparottomy, Evacuation of Abdominal Wall Hematoma;  Surgeon: Donnamae Jude, MD;  Location: [facility];  Service: Gynecology;  Laterality: N/A;    Family History  Problem Relation Age of Onset  . Breast cancer Maternal Grandmother   . Hypertension Maternal Grandmother   . Cancer Maternal Grandmother        breast  . Hypertension Mother   . Heart disease Mother   . Hypertension Paternal Grandmother   . Diabetes Paternal Grandmother   . Diabetes Father   . Hearing loss Neg Hx     No Known Allergies  Outpatient Medications Prior to Visit  Medication Sig Dispense Refill  . amLODipine (NORVASC) 10 MG tablet Take 1 tablet (10 mg total) by mouth daily. 90 tablet 1  . benzonatate (TESSALON) 100 MG capsule Take 1 capsule (100 mg total) by mouth every 8 (eight) hours. (Patient not taking: Reported on 02/15/2019) 21 capsule 0  . cetirizine (ZYRTEC ALLERGY) 10 MG tablet Take 1 tablet (10 mg total) by mouth daily. 30 tablet 1  . fluticasone (FLONASE) 50 MCG/ACT nasal spray Place 1 spray into both nostrils daily. (Patient not taking:  Reported on 03/21/2019) 16 g 1   No facility-administered medications prior to visit.     ROS Review of Systems  Constitutional: Negative for activity change, appetite change and fatigue.  HENT: Negative for congestion, sinus pressure and sore throat.   Eyes: Negative for visual disturbance.  Respiratory: Negative for cough, chest tightness, shortness of breath and wheezing.   Cardiovascular: Negative for chest pain and palpitations.  Gastrointestinal: Negative for abdominal distention, abdominal pain and constipation.  Endocrine: Negative for polydipsia.  Genitourinary: Negative for dysuria and frequency.  Musculoskeletal: Negative for arthralgias and back pain.  Skin: Negative for rash.  Neurological: Negative for tremors, light-headedness and numbness.  Hematological: Does not bruise/bleed easily.  Psychiatric/Behavioral: Negative for agitation and behavioral problems.    Objective:  BP 127/79   Pulse 78   Ht 5' 2" (1.575 m)   Wt 233 lb 9.6 oz (106 kg)   SpO2 98%   BMI 42.73 kg/m   BP/Weight 10/30/2019 03/21/2019 81/12/2991  Systolic BP 716 967 893  Diastolic BP 79 78 74  Wt. (Lbs) 233.6 225 223  BMI 42.73 41.15 40.79      Physical Exam Constitutional:      General: She is not in acute distress.    Appearance: She is well-developed. She is not diaphoretic.  HENT:     Head: Normocephalic.     Right Ear: External ear normal.     Left  Ear: External ear normal.     Nose: Nose normal.  Eyes:     Conjunctiva/sclera: Conjunctivae normal.     Pupils: Pupils are equal, round, and reactive to light.  Neck:     Vascular: No JVD.  Cardiovascular:     Rate and Rhythm: Normal rate and regular rhythm.     Heart sounds: Normal heart sounds. No murmur heard.  No gallop.   Pulmonary:     Effort: Pulmonary effort is normal. No respiratory distress.     Breath sounds: Normal breath sounds. No wheezing or rales.  Chest:     Chest wall: No tenderness.     Breasts:         Right: No mass or tenderness.        Left: No mass or tenderness.  Abdominal:     General: Bowel sounds are normal. There is no distension.     Palpations: Abdomen is soft. There is no mass.     Tenderness: There is no abdominal tenderness.  Genitourinary:    Comments: External genitalia normal, vagina, cervix with yellowish discharge.   Musculoskeletal:        General: No tenderness. Normal range of motion.     Cervical back: Normal range of motion.  Skin:    General: Skin is warm and dry.  Neurological:     Mental Status: She is alert and oriented to person, place, and time.     Deep Tendon Reflexes: Reflexes are normal and symmetric.     CMP Latest Ref Rng & Units 12/30/2017 07/14/2017 04/22/2015  Glucose 65 - 99 mg/dL 89 95 87  BUN 6 - 20 mg/dL 11 5(L) 6  Creatinine 0.57 - 1.00 mg/dL 0.81 0.37(L) 0.59  Sodium 134 - 144 mmol/L 142 134(L) 137  Potassium 3.5 - 5.2 mmol/L 3.8 3.5 3.8  Chloride 96 - 106 mmol/L 104 104 104  CO2 20 - 29 mmol/L 24 20(L) 24  Calcium 8.7 - 10.2 mg/dL 9.2 8.6(L) 9.0  Total Protein 6.5 - 8.1 g/dL - 6.8 -  Total Bilirubin 0.3 - 1.2 mg/dL - 0.6 -  Alkaline Phos 38 - 126 U/L - 117 -  AST 15 - 41 U/L - 20 -  ALT 14 - 54 U/L - 13(L) -    Lipid Panel     Component Value Date/Time   CHOL 167 12/30/2017 1004   TRIG 53 12/30/2017 1004   HDL 61 12/30/2017 1004   CHOLHDL 2.7 12/30/2017 1004   LDLCALC 95 12/30/2017 1004    CBC    Component Value Date/Time   WBC 6.9 12/30/2017 1004   WBC 11.9 (H) 07/19/2017 0553   RBC 4.62 12/30/2017 1004   RBC 2.89 (L) 07/19/2017 0553   HGB 13.6 12/30/2017 1004   HCT 41.3 12/30/2017 1004   PLT 259 12/30/2017 1004   MCV 89 12/30/2017 1004   MCH 29.4 12/30/2017 1004   MCH 28.0 07/19/2017 0553   MCHC 32.9 12/30/2017 1004   MCHC 32.5 07/19/2017 0553   RDW 14.7 12/30/2017 1004   LYMPHSABS 2.9 12/30/2017 1004   EOSABS 0.1 12/30/2017 1004   BASOSABS 0.0 12/30/2017 1004    No results found for: HGBA1C  Assessment  & Plan:  1. Annual physical exam Counseled on 150 minutes of exercise per week, healthy eating (including decreased daily intake of saturated fats, cholesterol, added sugars, sodium), routine healthcare maintenance.   2. Essential hypertension - CMP14+EGFR; Future - Lipid panel; Future - amLODipine (NORVASC) 10 MG  tablet; Take 1 tablet (10 mg total) by mouth daily.  Dispense: 90 tablet; Refill: 1  3. Screening for STD (sexually transmitted disease) - HIV Antibody (routine testing w rflx); Future  4. Screening for cervical cancer - Cytology - PAP([facility])  5. Need for hepatitis C screening test - HCV RNA quant rflx ultra or genotyp([facility]); Future    Meds ordered this encounter  Medications  . amLODipine (NORVASC) 10 MG tablet    Sig: Take 1 tablet (10 mg total) by mouth daily.    Dispense:  90 tablet    Refill:  1    Follow-up: Return in about 6 months (around 04/30/2020) for Chronic disease management.       Charlott Rakes, MD, FAAFP. [facility] and Sophia [place], [place]   10/30/2019, 3:47 PM

## 2019-10-30 NOTE — Patient Instructions (Signed)
Health Maintenance, Female Adopting a healthy lifestyle and getting preventive care are important in promoting health and wellness. Ask your health care provider about:  The right schedule for you to have regular tests and exams.  Things you can do on your own to prevent diseases and keep yourself healthy. What should I know about diet, weight, and exercise? Eat a healthy diet   Eat a diet that includes plenty of vegetables, fruits, low-fat dairy products, and lean protein.  Do not eat a lot of foods that are high in solid fats, added sugars, or sodium. Maintain a healthy weight Body mass index (BMI) is used to identify weight problems. It estimates body fat based on height and weight. Your health care provider can help determine your BMI and help you achieve or maintain a healthy weight. Get regular exercise Get regular exercise. This is one of the most important things you can do for your health. Most adults should:  Exercise for at least 150 minutes each week. The exercise should increase your heart rate and make you sweat (moderate-intensity exercise).  Do strengthening exercises at least twice a week. This is in addition to the moderate-intensity exercise.  Spend less time sitting. Even light physical activity can be beneficial. Watch cholesterol and blood lipids Have your blood tested for lipids and cholesterol at 36 years of age, then have this test every 5 years. Have your cholesterol levels checked more often if:  Your lipid or cholesterol levels are high.  You are older than 36 years of age.  You are at high risk for heart disease. What should I know about cancer screening? Depending on your health history and family history, you may need to have cancer screening at various ages. This may include screening for:  Breast cancer.  Cervical cancer.  Colorectal cancer.  Skin cancer.  Lung cancer. What should I know about heart disease, diabetes, and high blood  pressure? Blood pressure and heart disease  High blood pressure causes heart disease and increases the risk of stroke. This is more likely to develop in people who have high blood pressure readings, are of African descent, or are overweight.  Have your blood pressure checked: ? Every 3-5 years if you are 18-39 years of age. ? Every year if you are 40 years old or older. Diabetes Have regular diabetes screenings. This checks your fasting blood sugar level. Have the screening done:  Once every three years after age 40 if you are at a normal weight and have a low risk for diabetes.  More often and at a younger age if you are overweight or have a high risk for diabetes. What should I know about preventing infection? Hepatitis B If you have a higher risk for hepatitis B, you should be screened for this virus. Talk with your health care provider to find out if you are at risk for hepatitis B infection. Hepatitis C Testing is recommended for:  Everyone born from 1945 through 1965.  Anyone with known risk factors for hepatitis C. Sexually transmitted infections (STIs)  Get screened for STIs, including gonorrhea and chlamydia, if: ? You are sexually active and are younger than 36 years of age. ? You are older than 36 years of age and your health care provider tells you that you are at risk for this type of infection. ? Your sexual activity has changed since you were last screened, and you are at increased risk for chlamydia or gonorrhea. Ask your health care provider if   you are at risk.  Ask your health care provider about whether you are at high risk for HIV. Your health care provider may recommend a prescription medicine to help prevent HIV infection. If you choose to take medicine to prevent HIV, you should first get tested for HIV. You should then be tested every 3 months for as long as you are taking the medicine. Pregnancy  If you are about to stop having your period (premenopausal) and  you may become pregnant, seek counseling before you get pregnant.  Take 400 to 800 micrograms (mcg) of folic acid every day if you become pregnant.  Ask for birth control (contraception) if you want to prevent pregnancy. Osteoporosis and menopause Osteoporosis is a disease in which the bones lose minerals and strength with aging. This can result in bone fractures. If you are 65 years old or older, or if you are at risk for osteoporosis and fractures, ask your health care provider if you should:  Be screened for bone loss.  Take a calcium or vitamin D supplement to lower your risk of fractures.  Be given hormone replacement therapy (HRT) to treat symptoms of menopause. Follow these instructions at home: Lifestyle  Do not use any products that contain nicotine or tobacco, such as cigarettes, e-cigarettes, and chewing tobacco. If you need help quitting, ask your health care provider.  Do not use street drugs.  Do not share needles.  Ask your health care provider for help if you need support or information about quitting drugs. Alcohol use  Do not drink alcohol if: ? Your health care provider tells you not to drink. ? You are pregnant, may be pregnant, or are planning to become pregnant.  If you drink alcohol: ? Limit how much you use to 0-1 drink a day. ? Limit intake if you are breastfeeding.  Be aware of how much alcohol is in your drink. In the [place] one drink equals one 12 oz bottle of beer (355 mL), one 5 oz glass of wine (148 mL), or one 1 oz glass of hard liquor (44 mL). General instructions  Schedule regular health, dental, and eye exams.  Stay current with your vaccines.  Tell your health care provider if: ? You often feel depressed. ? You have ever been abused or do not feel safe at home. Summary  Adopting a healthy lifestyle and getting preventive care are important in promoting health and wellness.  Follow your health care provider's instructions about healthy  diet, exercising, and getting tested or screened for diseases.  Follow your health care provider's instructions on monitoring your cholesterol and blood pressure. This information is not intended to replace advice given to you by your health care provider. Make sure you discuss any questions you have with your health care provider. Document Revised: 04/20/2018 Document Reviewed: 04/20/2018 Elsevier Patient Education  2020 Elsevier Inc.  

## 2019-10-31 ENCOUNTER — Other Ambulatory Visit: Payer: Self-pay

## 2019-10-31 ENCOUNTER — Other Ambulatory Visit (HOSPITAL_COMMUNITY)
Admission: RE | Admit: 2019-10-31 | Discharge: 2019-10-31 | Disposition: A | Discharge status: Home / Self Care | Payer: Medicaid Other | Source: Ambulatory Visit | Attending: Family Medicine | Admitting: Family Medicine

## 2019-10-31 DIAGNOSIS — Z0000 Encounter for general adult medical examination without abnormal findings: Secondary | ICD-10-CM

## 2019-10-31 LAB — CYTOLOGY - PAP
Adequacy: ABSENT
Comment: NEGATIVE
Diagnosis: NEGATIVE
High risk HPV: NEGATIVE

## 2019-11-01 ENCOUNTER — Other Ambulatory Visit: Payer: Self-pay

## 2019-11-01 ENCOUNTER — Other Ambulatory Visit: Payer: Self-pay | Admitting: Family Medicine

## 2019-11-01 ENCOUNTER — Ambulatory Visit: Payer: Medicaid Other | Attending: Family Medicine

## 2019-11-01 ENCOUNTER — Telehealth: Payer: Self-pay

## 2019-11-01 DIAGNOSIS — I10 Essential (primary) hypertension: Secondary | ICD-10-CM

## 2019-11-01 DIAGNOSIS — Z1159 Encounter for screening for other viral diseases: Secondary | ICD-10-CM

## 2019-11-01 DIAGNOSIS — Z113 Encounter for screening for infections with a predominantly sexual mode of transmission: Secondary | ICD-10-CM

## 2019-11-01 LAB — CERVICOVAGINAL ANCILLARY ONLY
Bacterial Vaginitis (gardnerella): POSITIVE — AB
Candida Glabrata: NEGATIVE
Candida Vaginitis: NEGATIVE
Chlamydia: NEGATIVE
Comment: NEGATIVE
Comment: NEGATIVE
Comment: NEGATIVE
Comment: NEGATIVE
Comment: NEGATIVE
Comment: NORMAL
Neisseria Gonorrhea: NEGATIVE
Trichomonas: NEGATIVE

## 2019-11-01 MED ORDER — METRONIDAZOLE 0.75 % VA GEL: 1.0000 | Freq: Every day | VAGINAL | 0 refills | Status: DC

## 2019-11-01 NOTE — Telephone Encounter (Signed)
Patient name and DOB has been verified Patient was informed of lab results. Patient had no questions.  

## 2019-11-01 NOTE — Telephone Encounter (Signed)
-----   Message from [name], MD sent at 11/01/2019  3:02 PM EDT ----- Please inform her Pap smear is normal.  Vaginal cultures revealed bacterial vaginosis and I have sent a prescription to her pharmacy

## 2019-11-02 LAB — LIPID PANEL
Chol/HDL Ratio: 2.5 ratio (ref 0.0–4.4)
Cholesterol, Total: 173 mg/dL (ref 100–199)
HDL: 70 mg/dL (ref >39)
LDL Chol Calc (NIH): 90 mg/dL (ref 0–99)
Triglycerides: 66 mg/dL (ref 0–149)
VLDL Cholesterol Cal: 13 mg/dL (ref 5–40)

## 2019-11-02 LAB — CMP14+EGFR
ALT: 8 IU/L (ref 0–32)
AST: 16 IU/L (ref 0–40)
Albumin/Globulin Ratio: 1.6 (ref 1.2–2.2)
Albumin: 4.5 g/dL (ref 3.8–4.8)
Alkaline Phosphatase: 63 IU/L (ref 48–121)
BUN/Creatinine Ratio: 14 (ref 9–23)
BUN: 12 mg/dL (ref 6–20)
Bilirubin Total: 0.4 mg/dL (ref 0.0–1.2)
CO2: 23 mmol/L (ref 20–29)
Calcium: 9.4 mg/dL (ref 8.7–10.2)
Chloride: 101 mmol/L (ref 96–106)
Creatinine, Ser: 0.88 mg/dL (ref 0.57–1.00)
GFR calc Af Amer: 98 mL/min/{1.73_m2} (ref >59)
GFR calc non Af Amer: 85 mL/min/{1.73_m2} (ref >59)
Globulin, Total: 2.9 g/dL (ref 1.5–4.5)
Glucose: 88 mg/dL (ref 65–99)
Potassium: 4 mmol/L (ref 3.5–5.2)
Sodium: 138 mmol/L (ref 134–144)
Total Protein: 7.4 g/dL (ref 6.0–8.5)

## 2019-11-02 LAB — HCV RNA QUANT RFLX ULTRA OR GENOTYP: HCV Quant Baseline: NOT DETECTED IU/mL

## 2019-11-02 LAB — HIV ANTIBODY (ROUTINE TESTING W REFLEX): HIV Screen 4th Generation wRfx: NONREACTIVE

## 2019-11-06 ENCOUNTER — Telehealth: Payer: Self-pay

## 2019-11-06 ENCOUNTER — Encounter: Payer: Self-pay | Admitting: Family Medicine

## 2019-11-06 DIAGNOSIS — H1013 Acute atopic conjunctivitis, bilateral: Secondary | ICD-10-CM | POA: Diagnosis not present

## 2019-11-06 NOTE — Telephone Encounter (Signed)
-----   Message from [name], MD sent at 11/03/2019  8:46 AM EDT ----- Blood work is normal

## 2019-11-06 NOTE — Telephone Encounter (Signed)
Patient name and DOB has been verified Patient was informed of lab results. Patient had no questions.  

## 2020-04-23 DIAGNOSIS — R1013 Epigastric pain: Secondary | ICD-10-CM | POA: Diagnosis not present

## 2020-05-07 ENCOUNTER — Other Ambulatory Visit: Payer: Self-pay | Admitting: General Surgery

## 2020-05-07 DIAGNOSIS — R1013 Epigastric pain: Secondary | ICD-10-CM

## 2020-05-23 ENCOUNTER — Other Ambulatory Visit: Payer: Medicaid Other

## 2020-06-07 ENCOUNTER — Ambulatory Visit
Admission: RE | Admit: 2020-06-07 | Discharge: 2020-06-07 | Disposition: A | Discharge status: Home / Self Care | Payer: Medicaid Other | Source: Ambulatory Visit | Attending: General Surgery | Admitting: General Surgery

## 2020-06-07 DIAGNOSIS — R1013 Epigastric pain: Secondary | ICD-10-CM

## 2020-06-21 ENCOUNTER — Other Ambulatory Visit: Payer: Self-pay

## 2020-06-21 ENCOUNTER — Emergency Department (HOSPITAL_COMMUNITY)
Admission: EM | Admit: 2020-06-21 | Discharge: 2020-06-21 | Disposition: A | Discharge status: Home / Self Care | Payer: Medicaid Other | Attending: Emergency Medicine | Admitting: Emergency Medicine

## 2020-06-21 ENCOUNTER — Encounter (HOSPITAL_COMMUNITY): Payer: Self-pay | Admitting: Emergency Medicine

## 2020-06-21 DIAGNOSIS — Z20822 Contact with and (suspected) exposure to covid-19: Secondary | ICD-10-CM | POA: Diagnosis not present

## 2020-06-21 DIAGNOSIS — Z79899 Other long term (current) drug therapy: Secondary | ICD-10-CM | POA: Diagnosis not present

## 2020-06-21 DIAGNOSIS — J029 Acute pharyngitis, unspecified: Secondary | ICD-10-CM | POA: Diagnosis not present

## 2020-06-21 DIAGNOSIS — I10 Essential (primary) hypertension: Secondary | ICD-10-CM | POA: Insufficient documentation

## 2020-06-21 LAB — GROUP A STREP BY PCR: Group A Strep by PCR: NOT DETECTED

## 2020-06-21 LAB — SARS CORONAVIRUS 2 (TAT 6-24 HRS): SARS Coronavirus 2: NEGATIVE

## 2020-06-21 MED ORDER — AMOXICILLIN 500 MG PO CAPS: 500.0000 mg | ORAL_CAPSULE | Freq: Three times a day (TID) | ORAL | 0 refills | Status: DC

## 2020-06-21 NOTE — ED Provider Notes (Signed)
[facility] EMERGENCY DEPARTMENT Provider Note   CSN: [id number] Arrival date & time: 06/21/20  0021     History Chief Complaint  Patient presents with  . Sore Throat / Tonsils Swelling    Krystal Chan is a 35 y.o. female.  The history is provided by the patient and medical records.    35 y.o. F here with sore throat.  States began yesterday when she noticed abnormal sensation when eating.  States symptoms worsened today, now swallowing has become painful.  She is still able to eat/drink normally, taste and smell remain normal.  She denies sick contacts or covid exposures.  She is vaccinated for covid-19.  No fevers.  Past Medical History:  Diagnosis Date  . History of preterm delivery, currently pregnant 02/28/2015   [ ] 17-P  . Hypertension   . Umbilical hernia    repaired ~2004    Patient Active Problem List   Diagnosis Date Noted  . Hypertension 06/22/2018  . Anemia 12/30/2017    Past Surgical History:  Procedure Laterality Date  . CESAREAN SECTION MULTI-GESTATIONAL N/A 07/14/2017   Procedure: CESAREAN SECTION MULTI-GESTATIONAL;  Surgeon: 09/13/2017, DO;  [facility]: [facility];  Service: Obstetrics;  Laterality: N/A;  . HERNIA REPAIR     gastic hernia 2005 and umbilical hernia 2004  . LAPAROTOMY N/A 07/17/2017   Procedure: Exploratory Laparottomy, Evacuation of Abdominal Wall Hematoma;  Surgeon: 09/16/2017, MD;  Location: [facility];  Service: Gynecology;  Laterality: N/A;     OB History    Gravida  6   Para  4   Term  2   Preterm  2   AB  2   Living  5     SAB      IAB  2   Ectopic      Multiple  1   Live Births  5           Family History  Problem Relation Age of Onset  . Breast cancer Maternal Grandmother   . Hypertension Maternal Grandmother   . Cancer Maternal Grandmother        breast  . Hypertension Mother   . Heart disease Mother   . Hypertension Paternal Grandmother   . Diabetes Paternal  Grandmother   . Diabetes Father   . Hearing loss Neg Hx     Social History   Tobacco Use  . Smoking status: Never Smoker  . Smokeless tobacco: Never Used  Vaping Use  . Vaping Use: Never used  Substance Use Topics  . Alcohol use: No    Comment: ocassionally  . Drug use: No    Home Medications Prior to Admission medications   Medication Sig Start Date End Date Taking? Authorizing Provider  amLODipine (NORVASC) 10 MG tablet Take 1 tablet (10 mg total) by mouth daily. 10/30/19   11/01/19, MD  benzonatate (TESSALON) 100 MG capsule Take 1 capsule (100 mg total) by mouth every 8 (eight) hours. Patient not taking: Reported on 02/15/2019 06/22/18   08/21/18, MD  cetirizine (ZYRTEC ALLERGY) 10 MG tablet Take 1 tablet (10 mg total) by mouth daily. 02/15/19 03/17/19  13/6/20, MD  fluticasone (FLONASE) 50 MCG/ACT nasal spray Place 1 spray into both nostrils daily. Patient not taking: Reported on 03/21/2019 02/15/19   04/17/19, MD  metroNIDAZOLE (METROGEL VAGINAL) 0.75 % vaginal gel Place 1 Applicatorful vaginally at bedtime. 11/01/19   11/03/19, MD    Allergies  Patient has no known allergies.  Review of Systems   Review of Systems  HENT: Positive for sore throat.   All other systems reviewed and are negative.   Physical Exam Updated Vital Signs BP 137/85 (BP Location: Right Arm)   Pulse 78   Temp 98.5 F (36.9 C)   Resp 20   Ht 5\' 2"  (1.575 m)   Wt 115 kg   LMP 06/09/2020   SpO2 98%   BMI 46.37 kg/m   Physical Exam Vitals and nursing note reviewed.  Constitutional:      Appearance: She is well-developed and well-nourished.  HENT:     Head: Normocephalic and atraumatic.     Mouth/Throat:     Mouth: Oropharynx is clear and moist.     Comments: Tonsils 2+ bilaterally with exudates, grossly symmetric; uvula midline without evidence of peritonsillar abscess; handling secretions appropriately; no difficulty swallowing or speaking; normal  phonation without stridor Eyes:     Extraocular Movements: EOM normal.     Conjunctiva/sclera: Conjunctivae normal.     Pupils: Pupils are equal, round, and reactive to light.  Cardiovascular:     Rate and Rhythm: Normal rate and regular rhythm.     Heart sounds: Normal heart sounds.  Pulmonary:     Effort: Pulmonary effort is normal. No respiratory distress.     Breath sounds: Normal breath sounds. No rhonchi.  Abdominal:     General: Bowel sounds are normal.     Palpations: Abdomen is soft.     Tenderness: There is no abdominal tenderness. There is no rebound.  Musculoskeletal:        General: Normal range of motion.     Cervical back: Normal range of motion.  Skin:    General: Skin is warm and dry.  Neurological:     Mental Status: She is alert and oriented to person, place, and time.  Psychiatric:        Mood and Affect: Mood and affect normal.     ED Results / Procedures / Treatments   Labs (all labs ordered are listed, but only abnormal results are displayed) Labs Reviewed  SARS CORONAVIRUS 2 (TAT 6-24 HRS)  GROUP A STREP BY PCR    EKG None  Radiology No results found.  Procedures Procedures   Medications Ordered in ED Medications - No data to display  ED Course  I have reviewed the triage vital signs and the nursing notes.  Pertinent labs & imaging results that were available during my care of the patient were reviewed by me and considered in my medical decision making (see chart for details).    MDM Rules/Calculators/A&P  35 year old female presenting to the ED with sore throat that began yesterday.  Progressively worsening today.  No difficulty swallowing or speaking.  She is afebrile and nontoxic.  Does have tonsillar edema with exudates, this is grossly symmetric without uvular deviation.  She has normal phonation without stridor, handling secretions well.  No clinical signs or symptoms suggestive of PTA or deep space infection of the neck.  Rapid  strep was sent and is negative.  Covid screen pending.  May ultimately be viral in nature.  Plan to discharge home.  She was given prescription for antibiotic to fill in the next few days if Covid screen is negative and symptoms are not improving.  Close follow-up with PCP.  Return here for any new or acute changes.  Final Clinical Impression(s) / ED Diagnoses Final diagnoses:  Sore throat  Rx / DC Orders ED Discharge Orders         Ordered    amoxicillin (AMOXIL) 500 MG capsule  3 times daily        06/21/20 0351           Garlon Hatchet, PA-C 06/21/20 0354    Geoffery Lyons, MD 06/21/20 2333

## 2020-06-21 NOTE — ED Triage Notes (Signed)
Patient reports sore throat with mild swelling onset yesterday , airway intact , denies fever or chills , respirations unlabored.

## 2020-06-21 NOTE — Discharge Instructions (Addendum)
Strep test is negative.  Covid test still pending. If covid test is negative and symptoms not improving soon, go ahead and start the antibiotic. If covid test is positive, this is likely source of symptoms and would continue symptom control. Follow-up with your primary care doctor. Return here for new concerns.

## 2020-07-29 ENCOUNTER — Other Ambulatory Visit: Payer: Self-pay | Admitting: Family Medicine

## 2020-07-29 DIAGNOSIS — I10 Essential (primary) hypertension: Secondary | ICD-10-CM

## 2020-07-29 DIAGNOSIS — Z1152 Encounter for screening for COVID-19: Secondary | ICD-10-CM | POA: Diagnosis not present

## 2020-07-29 NOTE — Telephone Encounter (Signed)
Requested medication (s) are due for refill today: no  Requested medication (s) are on the active medication list:yes  Last refill:  04/01/2020  Future visit scheduled: no  Notes to clinic: overdue for 6 month follow up  Message has been sent to patient to contact office    Requested Prescriptions  Pending Prescriptions Disp Refills   amLODipine (NORVASC) 10 MG tablet [Pharmacy Med Name: AMLODIPINE BESYLATE 10 MG TAB] 90 tablet 1    Sig: TAKE 1 TABLET BY MOUTH EVERY DAY      Cardiovascular:  Calcium Channel Blockers Failed - 07/29/2020  9:11 AM      Failed - Last BP in normal range    BP Readings from Last 1 Encounters:  06/21/20 (!) 142/88          Failed - Valid encounter within last 6 months    Recent Outpatient Visits           9 months ago Annual physical exam   [facility] [place], Odette Horns, MD   1 year ago Vaginal discharge   [facility] [name], MD   1 year ago Morbid obesity [facility])   [facility] [name], MD   2 years ago Cough   [facility] [name], MD   2 years ago Essential hypertension   [facility] Victoria Vera, [place], MD

## 2020-08-08 ENCOUNTER — Ambulatory Visit: Payer: Medicaid Other | Admitting: Gastroenterology

## 2020-08-22 ENCOUNTER — Encounter: Payer: Self-pay | Admitting: Family Medicine

## 2020-09-11 ENCOUNTER — Ambulatory Visit (INDEPENDENT_AMBULATORY_CARE_PROVIDER_SITE_OTHER): Payer: Medicaid Other | Admitting: Gastroenterology

## 2020-09-11 ENCOUNTER — Encounter: Payer: Self-pay | Admitting: Gastroenterology

## 2020-09-11 VITALS — BP 126/84 | HR 78 | Ht 62.0 in | Wt 229.6 lb

## 2020-09-11 DIAGNOSIS — R1013 Epigastric pain: Secondary | ICD-10-CM

## 2020-09-11 DIAGNOSIS — K219 Gastro-esophageal reflux disease without esophagitis: Secondary | ICD-10-CM

## 2020-09-11 NOTE — Patient Instructions (Signed)
Patient advised to avoid spicy, acidic, citrus, chocolate, mints, fruit and fruit juices.  Limit the intake of caffeine, alcohol and Soda.  Don't exercise too soon after eating.  Don't lie down within 3-4 hours of eating.  Elevate the head of your bed.  You have been scheduled for an endoscopy. Please follow written instructions given to you at your visit today. If you use inhalers (even only as needed), please bring them with you on the day of your procedure.   Normal BMI (Body Mass Index- based on height and weight) is between 19 and 25. Your BMI today is Body mass index is 41.99 kg/m. Marland Kitchen Please consider follow up  regarding your BMI with your Primary Care Provider.  Thank you for choosing me and [facility].  Krystal Chan. Pleas Koch., MD., [place]een Graham

## 2020-09-11 NOTE — Progress Notes (Signed)
History of Present Illness: This is a 35 year old female referred by Krystal Register, MD for the evaluation of epigastric pain.  She relates epigastric pain beginning during her pregnancy with twins who were born in March 2019.  Since then she has had persistent problems with epigastric pain that is positional.  She states when she lays flat or has certain movements she has pain and bulging in her epigastric area. CT AP performed in January 2022 showed small liver cysts, small right renal stone, otherwise negative.  She she was evaluated by Dr. Chevis Chan with rectus diastases noted.  For the past several weeks she has noted mild heartburn symptoms that are occurring once or twice per week and are easily controlled with Tums.  She has occasional mild constipation that she treats with diet.  No other gastrointestinal complaints. Denies weight loss, diarrhea, change in stool caliber, melena, hematochezia, nausea, vomiting, dysphagia, chest pain.   No Known Allergies Outpatient Medications Prior to Visit  Medication Sig Dispense Refill  . amLODipine (NORVASC) 10 MG tablet Take 1 tablet (10 mg total) by mouth daily. Must have office visit for refills 30 tablet 0  . fluticasone (FLONASE) 50 MCG/ACT nasal spray Place 1 spray into both nostrils daily. 16 g 1  . amoxicillin (AMOXIL) 500 MG capsule Take 1 capsule (500 mg total) by mouth 3 (three) times daily. 30 capsule 0  . benzonatate (TESSALON) 100 MG capsule Take 1 capsule (100 mg total) by mouth every 8 (eight) hours. (Patient not taking: Reported on 02/15/2019) 21 capsule 0  . cetirizine (ZYRTEC ALLERGY) 10 MG tablet Take 1 tablet (10 mg total) by mouth daily. 30 tablet 1  . metroNIDAZOLE (METROGEL VAGINAL) 0.75 % vaginal gel Place 1 Applicatorful vaginally at bedtime. 70 g 0   No facility-administered medications prior to visit.   Past Medical History:  Diagnosis Date  . History of preterm delivery, currently pregnant 02/28/2015   [ ] 17-P  .  Hypertension   . Umbilical hernia    repaired ~2004   Past Surgical History:  Procedure Laterality Date  . CESAREAN SECTION MULTI-GESTATIONAL N/A 07/14/2017   Procedure: CESAREAN SECTION MULTI-GESTATIONAL;  Surgeon: 09/13/2017, DO;  Location: [facility];  Service: Obstetrics;  Laterality: N/A;  . HERNIA REPAIR     gastic hernia 2005 and umbilical hernia 2004  . LAPAROTOMY N/A 07/17/2017   Procedure: Exploratory Laparottomy, Evacuation of Abdominal Wall Hematoma;  Surgeon: 09/16/2017, MD;  Location: [facility];  Service: Gynecology;  Laterality: N/A;   Social History   Socioeconomic History  . Marital status: Single    Spouse name: Not on file  . Number of children: Not on file  . Years of education: Not on file  . Highest education level: Not on file  Occupational History  . Not on file  Tobacco Use  . Smoking status: Never Smoker  . Smokeless tobacco: Never Used  Vaping Use  . Vaping Use: Never used  Substance and Sexual Activity  . Alcohol use: No    Comment: ocassionally  . Drug use: No  . Sexual activity: Yes    Birth control/protection: Surgical  Other Topics Concern  . Not on file  Social History Narrative  . Not on file   Social Determinants of Health   Financial Resource Strain: Not on file  Food Insecurity: Not on file  Transportation Needs: Not on file  Physical Activity: Not on file  Stress: Not on file  Social Connections: Not  on file   Family History  Problem Relation Age of Onset  . Breast cancer Maternal Grandmother   . Hypertension Maternal Grandmother   . Cancer Maternal Grandmother        breast  . Hypertension Mother   . Heart disease Mother   . Hypertension Paternal Grandmother   . Diabetes Paternal Grandmother   . Diabetes Father   . Hearing loss Neg Hx   . Colon cancer Neg Hx   . Stomach cancer Neg Hx   . Liver disease Neg Hx   . Pancreatic cancer Neg Hx   . Esophageal cancer Neg Hx       Review of Systems: Pertinent  positive and negative review of systems were noted in the above HPI section. All other review of systems were otherwise negative.    Physical Exam: General: Well developed, well nourished, no acute distress Head: Normocephalic and atraumatic Eyes: Sclerae anicteric, EOMI Ears: Normal auditory acuity Mouth: Not examined, mask on during Covid-19 pandemic Neck: Supple, no masses or thyromegaly Lungs: Clear throughout to auscultation Heart: Regular rate and rhythm; no murmurs, rubs or bruits Abdomen: Soft, mild epigastric tenderness and non distended.  Diastases rectus.  No masses, hepatosplenomegaly or hernias noted. Normal Bowel sounds Rectal: Not done Musculoskeletal: Symmetrical with no gross deformities  Skin: No lesions on visible extremities Pulses:  Normal pulses noted Extremities: No clubbing, cyanosis, edema or deformities noted Neurological: Alert oriented x 4, grossly nonfocal Cervical Nodes:  No significant cervical adenopathy Inguinal Nodes: No significant inguinal adenopathy Psychological:  Alert and cooperative. Normal mood and affect   Assessment and Recommendations:  1. Epigastric pain for 3 years which is positional and appears to be musculoskeletal.  Diastases rectus.  She has mild heartburn consistent with GERD.  Schedule EGD to rule out unlikely causes of epigastric pain such as an ulcer or gastritis.  Follow antireflux measures.  Tums as needed.  Consider Prilosec 20 mg OTC daily if reflux symptoms worsen. The risks (including bleeding, perforation, infection, missed lesions, medication reactions and possible hospitalization or surgery if complications occur), benefits, and alternatives to endoscopy with possible biopsy and possible dilation were discussed with the patient and they consent to proceed.      cc: Krystal Register, MD [address] [place],  [place] [address]

## 2020-09-26 ENCOUNTER — Ambulatory Visit (AMBULATORY_SURGERY_CENTER): Payer: Medicaid Other | Admitting: Gastroenterology

## 2020-09-26 ENCOUNTER — Encounter: Payer: Self-pay | Admitting: Gastroenterology

## 2020-09-26 ENCOUNTER — Other Ambulatory Visit: Payer: Self-pay

## 2020-09-26 VITALS — BP 123/84 | HR 51 | Temp 97.3°F | Resp 16 | Ht 62.0 in | Wt 229.0 lb

## 2020-09-26 DIAGNOSIS — R109 Unspecified abdominal pain: Secondary | ICD-10-CM | POA: Diagnosis not present

## 2020-09-26 DIAGNOSIS — K219 Gastro-esophageal reflux disease without esophagitis: Secondary | ICD-10-CM

## 2020-09-26 DIAGNOSIS — R1013 Epigastric pain: Secondary | ICD-10-CM | POA: Diagnosis not present

## 2020-09-26 DIAGNOSIS — I10 Essential (primary) hypertension: Secondary | ICD-10-CM | POA: Diagnosis not present

## 2020-09-26 MED ORDER — SODIUM CHLORIDE 0.9 % IV SOLN: 500.0000 mL | INTRAVENOUS | Status: DC

## 2020-09-26 NOTE — Patient Instructions (Signed)
YOU HAD AN ENDOSCOPIC PROCEDURE TODAY AT THE [facility]:   Refer to the procedure report that was given to you for any specific questions about what was found during the examination.  If the procedure report does not answer your questions, please call your gastroenterologist to clarify.  If you requested that your care partner not be given the details of your procedure findings, then the procedure report has been included in a sealed envelope for you to review at your convenience later.  YOU SHOULD EXPECT: Some feelings of bloating in the abdomen. Passage of more gas than usual.  Walking can help get rid of the air that was put into your GI tract during the procedure and reduce the bloating. If you had a lower endoscopy (such as a colonoscopy or flexible sigmoidoscopy) you may notice spotting of blood in your stool or on the toilet paper. If you underwent a bowel prep for your procedure, you may not have a normal bowel movement for a few days.  Please Note:  You might notice some irritation and congestion in your nose or some drainage.  This is from the oxygen used during your procedure.  There is no need for concern and it should clear up in a day or so.  SYMPTOMS TO REPORT IMMEDIATELY:    Following upper endoscopy (EGD)  Vomiting of blood or coffee ground material  New chest pain or pain under the shoulder blades  Painful or persistently difficult swallowing  New shortness of breath  Fever of 100F or higher  Black, tarry-looking stools  For urgent or emergent issues, a gastroenterologist can be reached at any hour by calling [phone]. Do not use MyChart messaging for urgent concerns.    DIET:  We do recommend a small meal at first, but then you may proceed to your regular diet.  Drink plenty of fluids but you should avoid alcoholic beverages for 24 hours.  ACTIVITY:  You should plan to take it easy for the rest of today and you should NOT DRIVE or use heavy machinery  until tomorrow (because of the sedation medicines used during the test).    FOLLOW UP: Our staff will call the number listed on your records 48-72 hours following your procedure to check on you and address any questions or concerns that you may have regarding the information given to you following your procedure. If we do not reach you, we will leave a message.  We will attempt to reach you two times.  During this call, we will ask if you have developed any symptoms of COVID 19. If you develop any symptoms (ie: fever, flu-like symptoms, shortness of breath, cough etc.) before then, please call [phone].  If you test positive for Covid 19 in the 2 weeks post procedure, please call and report this information to us.    If any biopsies were taken you will be contacted by phone or by letter within the next 1-3 weeks.  Please call us at [phone] if you have not heard about the biopsies in 3 weeks.    SIGNATURES/CONFIDENTIALITY: You and/or your care partner have signed paperwork which will be entered into your electronic medical record.  These signatures attest to the fact that that the information above on your After Visit Summary has been reviewed and is understood.  Full responsibility of the confidentiality of this discharge information lies with you and/or your care-partner. 

## 2020-09-26 NOTE — Op Note (Signed)
[facility] Patient Name: Krystal Chan Procedure Date: 09/26/2020 9:51 AM MRN: [id number] Endoscopist: Meryl Dare , MD Age: 35 Referring MD:  Date of Birth: 12-Jul-1985 Gender: Female Account #: [id number] Procedure:                Upper GI endoscopy Indications:              Epigastric abdominal pain, Suspected                            gastroesophageal reflux disease Medicines:                Monitored Anesthesia Care Procedure:                Pre-Anesthesia Assessment:                           - Prior to the procedure, a History and Physical                            was performed, and patient medications and                            allergies were reviewed. The patient's tolerance of                            previous anesthesia was also reviewed. The risks                            and benefits of the procedure and the sedation                            options and risks were discussed with the patient.                            All questions were answered, and informed consent                            was obtained. Prior Anticoagulants: The patient has                            taken no previous anticoagulant or antiplatelet                            agents. ASA Grade Assessment: II - A patient with                            mild systemic disease. After reviewing the risks                            and benefits, the patient was deemed in                            satisfactory condition to undergo the procedure.  After obtaining informed consent, the endoscope was                            passed under direct vision. Throughout the                            procedure, the patient's blood pressure, pulse, and                            oxygen saturations were monitored continuously. The                            Endoscope was introduced through the mouth, and                            advanced to the second part of duodenum.  The upper                            GI endoscopy was accomplished without difficulty.                            The patient tolerated the procedure well. Scope In: Scope Out: Findings:                 The esophagus was normal.                           The stomach was normal.                           The examined duodenum was normal.                           The cardia and gastric fundus were normal on                            retroflexion. Complications:            No immediate complications. Estimated Blood Loss:     Estimated blood loss: none. Impression:               - Normal esophagus.                           - Normal stomach.                           - Normal examined duodenum.                           - No specimens collected. Recommendation:           - Patient has a contact number available for                            emergencies. The signs and symptoms of potential  delayed complications were discussed with the                            patient. Return to normal activities tomorrow.                            Written discharge instructions were provided to the                            patient.                           - Resume previous diet.                           - Continue present medications.                           - Return to PCP for ongoing care. Meryl Dare, MD 09/26/2020 10:15:18 AM This report has been signed electronically.

## 2020-09-26 NOTE — Progress Notes (Signed)
1004 Robinul 0.1 mg IV given due large amount of secretions upon assessment.  MD made aware, vss 

## 2020-09-26 NOTE — Progress Notes (Signed)
Report given to PACU, vss 

## 2020-09-26 NOTE — Progress Notes (Signed)
Pt's states no medical or surgical changes since previsit or office visit. 

## 2020-09-30 ENCOUNTER — Telehealth: Payer: Self-pay

## 2020-09-30 NOTE — Telephone Encounter (Signed)
  Follow up Call-  Call back number 09/26/2020  Post procedure Call Back phone  # [phone]  Permission to leave phone message Yes  Some recent data might be hidden     Patient questions:  Do you have a fever, pain , or abdominal swelling? No. Pain Score  0 *  Have you tolerated food without any problems? Yes.    Have you been able to return to your normal activities? Yes.    Do you have any questions about your discharge instructions: Diet   No. Medications  No. Follow up visit  No.  Do you have questions or concerns about your Care? No.  Actions: * If pain score is 4 or above: No action needed, pain <4.  1. Have you developed a fever since your procedure? no  2.   Have you had an respiratory symptoms (SOB or cough) since your procedure? No   3.   Have you tested positive for COVID 19 since your procedure no  4.   Have you had any family members/close contacts diagnosed with the COVID 19 since your procedure?  no   If yes to any of these questions please route to Laverna Peace, RN and Karlton Lemon, RN

## 2020-09-30 NOTE — Telephone Encounter (Signed)
Called [phone] and left a message we tried to reach pt for a follow up call. maw

## 2020-10-09 ENCOUNTER — Other Ambulatory Visit: Payer: Self-pay

## 2020-10-09 ENCOUNTER — Encounter: Payer: Self-pay | Admitting: Family Medicine

## 2020-10-09 ENCOUNTER — Ambulatory Visit: Payer: Medicaid Other | Attending: Family Medicine | Admitting: Family Medicine

## 2020-10-09 DIAGNOSIS — Z1159 Encounter for screening for other viral diseases: Secondary | ICD-10-CM | POA: Diagnosis not present

## 2020-10-09 DIAGNOSIS — I10 Essential (primary) hypertension: Secondary | ICD-10-CM

## 2020-10-09 MED ORDER — AMLODIPINE BESYLATE 10 MG PO TABS: 10.0000 mg | ORAL_TABLET | Freq: Every day | ORAL | 1 refills | Status: DC

## 2020-10-09 NOTE — Progress Notes (Signed)
Subjective:  Patient ID: Krystal Chan, female    DOB: [birth year]  Age: 35 y.o. MRN: [id number]  CC: Weight Loss   HPI Krystal Chan is a 35 year old female with a history of hypertension, morbid obesity who presents today for follow-up visit.  Interval History: She would like to be referred to a Bariatric surgeon to discuss weight loss surgery. For her weight loss she saw a Nutritionist in the past and has tried Herbal life. She walks about 45 minutes  A couple of days/ week and is discouraged due to the lack of weight loss.  She is also unhappy people ask her if she is pregnant due to her abdomen being distended. Her blood pressure is elevated and she has been out of her antihypertensives.  Past Medical History:  Diagnosis Date  . History of preterm delivery, currently pregnant 02/28/2015   [ ] 17-P  . Hypertension   . Umbilical hernia    repaired ~2004    Past Surgical History:  Procedure Laterality Date  . CESAREAN SECTION MULTI-GESTATIONAL N/A 07/14/2017   Procedure: CESAREAN SECTION MULTI-GESTATIONAL;  Surgeon: Truett Mainland, DO;  Location: [facility];  Service: Obstetrics;  Laterality: N/A;  . HERNIA REPAIR     gastic hernia 7121 and umbilical hernia 9758  . LAPAROTOMY N/A 07/17/2017   Procedure: Exploratory Laparottomy, Evacuation of Abdominal Wall Hematoma;  Surgeon: Donnamae Jude, MD;  Location: [facility];  Service: Gynecology;  Laterality: N/A;    Family History  Problem Relation Age of Onset  . Breast cancer Maternal Grandmother   . Hypertension Maternal Grandmother   . Cancer Maternal Grandmother        breast  . Hypertension Mother   . Heart disease Mother   . Hypertension Paternal Grandmother   . Diabetes Paternal Grandmother   . Diabetes Father   . Hearing loss Neg Hx   . Colon cancer Neg Hx   . Stomach cancer Neg Hx   . Liver disease Neg Hx   . Pancreatic cancer Neg Hx   . Esophageal cancer Neg Hx     No Known Allergies  Outpatient Medications  Prior to Visit  Medication Sig Dispense Refill  . amLODipine (NORVASC) 10 MG tablet Take 1 tablet (10 mg total) by mouth daily. Must have office visit for refills 30 tablet 0  . fluticasone (FLONASE) 50 MCG/ACT nasal spray Place 1 spray into both nostrils daily. (Patient not taking: No sig reported) 16 g 1   Facility-Administered Medications Prior to Visit  Medication Dose Route Frequency Provider Last Rate Last Admin  . 0.9 %  sodium chloride infusion  500 mL Intravenous Continuous Ladene Artist, MD         ROS Review of Systems  Constitutional: Negative for activity change, appetite change and fatigue.  HENT: Negative for congestion, sinus pressure and sore throat.   Eyes: Negative for visual disturbance.  Respiratory: Negative for cough, chest tightness, shortness of breath and wheezing.   Cardiovascular: Negative for chest pain and palpitations.  Gastrointestinal: Negative for abdominal distention, abdominal pain and constipation.  Endocrine: Negative for polydipsia.  Genitourinary: Negative for dysuria and frequency.  Musculoskeletal: Negative for arthralgias and back pain.  Skin: Negative for rash.  Neurological: Negative for tremors, light-headedness and numbness.  Hematological: Does not bruise/bleed easily.  Psychiatric/Behavioral: Negative for agitation and behavioral problems.    Objective:  BP (!) 144/83   Pulse 70   Ht 5' 2"  (1.575 m)   Wt 232 lb (105.2  kg)   LMP 09/19/2020   SpO2 100%   BMI 42.43 kg/m   BP/Weight 10/09/2020 8/92/1194 05/18/4079  Systolic BP 448 185 631  Diastolic BP 83 84 84  Wt. (Lbs) 232 229 229.6  BMI 42.43 41.88 41.99      Physical Exam Constitutional:      Appearance: She is well-developed. She is obese.  Neck:     Vascular: No JVD.  Cardiovascular:     Rate and Rhythm: Normal rate.     Heart sounds: Normal heart sounds. No murmur heard.   Pulmonary:     Effort: Pulmonary effort is normal.     Breath sounds: Normal breath  sounds. No wheezing or rales.  Chest:     Chest wall: No tenderness.  Abdominal:     General: Bowel sounds are normal. There is no distension.     Palpations: Abdomen is soft. There is no mass.     Tenderness: There is no abdominal tenderness.  Musculoskeletal:        General: Normal range of motion.     Right lower leg: No edema.     Left lower leg: No edema.  Neurological:     Mental Status: She is alert and oriented to person, place, and time.  Psychiatric:        Mood and Affect: Mood normal.     CMP Latest Ref Rng & Units 11/01/2019 12/30/2017 07/14/2017  Glucose 65 - 99 mg/dL 88 89 95  BUN 6 - 20 mg/dL 12 11 5(L)  Creatinine 0.57 - 1.00 mg/dL 0.88 0.81 0.37(L)  Sodium 134 - 144 mmol/L 138 142 134(L)  Potassium 3.5 - 5.2 mmol/L 4.0 3.8 3.5  Chloride 96 - 106 mmol/L 101 104 104  CO2 20 - 29 mmol/L 23 24 20(L)  Calcium 8.7 - 10.2 mg/dL 9.4 9.2 8.6(L)  Total Protein 6.0 - 8.5 g/dL 7.4 - 6.8  Total Bilirubin 0.0 - 1.2 mg/dL 0.4 - 0.6  Alkaline Phos 48 - 121 IU/L 63 - 117  AST 0 - 40 IU/L 16 - 20  ALT 0 - 32 IU/L 8 - 13(L)    Lipid Panel     Component Value Date/Time   CHOL 173 11/01/2019 0920   TRIG 66 11/01/2019 0920   HDL 70 11/01/2019 0920   CHOLHDL 2.5 11/01/2019 0920   LDLCALC 90 11/01/2019 0920    CBC    Component Value Date/Time   WBC 6.9 12/30/2017 1004   WBC 11.9 (H) 07/19/2017 0553   RBC 4.62 12/30/2017 1004   RBC 2.89 (L) 07/19/2017 0553   HGB 13.6 12/30/2017 1004   HCT 41.3 12/30/2017 1004   PLT 259 12/30/2017 1004   MCV 89 12/30/2017 1004   MCH 29.4 12/30/2017 1004   MCH 28.0 07/19/2017 0553   MCHC 32.9 12/30/2017 1004   MCHC 32.5 07/19/2017 0553   RDW 14.7 12/30/2017 1004   LYMPHSABS 2.9 12/30/2017 1004   EOSABS 0.1 12/30/2017 1004   BASOSABS 0.0 12/30/2017 1004    No results found for: HGBA1C  Assessment & Plan:  1. Essential hypertension Uncontrolled due to running out of antihypertensive which I have refilled Counseled on blood  pressure goal of less than 130/80, low-sodium, DASH diet, medication compliance, 150 minutes of moderate intensity exercise per week. Discussed medication compliance, adverse effects. - amLODipine (NORVASC) 10 MG tablet; Take 1 tablet (10 mg total) by mouth daily.  Dispense: 90 tablet; Refill: 1 - CMP14+EGFR - Lipid panel  2. Morbid obesity (  [place]) Conservative measures have been ineffective and she will benefit from Bariatric surgery Encouraged to continue with lifestyle modifications and increasing exercise up to goal of 150 minutes of moderate intensity exercise. - Amb Referral to Bariatric Surgery  3. Need for hepatitis C screening test - HCV RNA quant rflx ultra or genotyp([facility])    No orders of the defined types were placed in this encounter.    Return in about 6 months (around 04/10/2021) for chronic disease management.      Charlott Rakes, MD, FAAFP. [facility] [place], [place]   10/09/2020, 11:20 AM

## 2020-10-09 NOTE — Progress Notes (Signed)
Discuss weight loss surgery.

## 2020-10-09 NOTE — Patient Instructions (Signed)
Calorie Counting for Weight Loss Calories are units of energy. Your body needs a certain number of calories from food to keep going throughout the day. When you eat or drink more calories than your body needs, your body stores the extra calories mostly as fat. When you eat or drink fewer calories than your body needs, your body burns fat to get the energy it needs. Calorie counting means keeping track of how many calories you eat and drink each day. Calorie counting can be helpful if you need to lose weight. If you eat fewer calories than your body needs, you should lose weight. Ask your health care provider what a healthy weight is for you. For calorie counting to work, you will need to eat the right number of calories each day to lose a healthy amount of weight per week. A dietitian can help you figure out how many calories you need in a day and will suggest ways to reach your calorie goal.  A healthy amount of weight to lose each week is usually 1-2 lb (0.5-0.9 kg). This usually means that your daily calorie intake should be reduced by 500-750 calories.  Eating 1,200-1,500 calories a day can help most women lose weight.  Eating 1,500-1,800 calories a day can help most men lose weight. What do I need to know about calorie counting? Work with your health care provider or dietitian to determine how many calories you should get each day. To meet your daily calorie goal, you will need to:  Find out how many calories are in each food that you would like to eat. Try to do this before you eat.  Decide how much of the food you plan to eat.  Keep a food log. Do this by writing down what you ate and how many calories it had. To successfully lose weight, it is important to balance calorie counting with a healthy lifestyle that includes regular activity. Where do I find calorie information? The number of calories in a food can be found on a Nutrition Facts label. If a food does not have a Nutrition Facts  label, try to look up the calories online or ask your dietitian for help. Remember that calories are listed per serving. If you choose to have more than one serving of a food, you will have to multiply the calories per serving by the number of servings you plan to eat. For example, the label on a package of bread might say that a serving size is 1 slice and that there are 90 calories in a serving. If you eat 1 slice, you will have eaten 90 calories. If you eat 2 slices, you will have eaten 180 calories.   How do I keep a food log? After each time that you eat, record the following in your food log as soon as possible:  What you ate. Be sure to include toppings, sauces, and other extras on the food.  How much you ate. This can be measured in cups, ounces, or number of items.  How many calories were in each food and drink.  The total number of calories in the food you ate. Keep your food log near you, such as in a pocket-sized notebook or on an app or website on your mobile phone. Some programs will calculate calories for you and show you how many calories you have left to meet your daily goal. What are some portion-control tips?  Know how many calories are in a serving. This will   help you know how many servings you can have of a certain food.  Use a measuring cup to measure serving sizes. You could also try weighing out portions on a kitchen scale. With time, you will be able to estimate serving sizes for some foods.  Take time to put servings of different foods on your favorite plates or in your favorite bowls and cups so you know what a serving looks like.  Try not to eat straight from a food's packaging, such as from a bag or box. Eating straight from the package makes it hard to see how much you are eating and can lead to overeating. Put the amount you would like to eat in a cup or on a plate to make sure you are eating the right portion.  Use smaller plates, glasses, and bowls for smaller  portions and to prevent overeating.  Try not to multitask. For example, avoid watching TV or using your computer while eating. If it is time to eat, sit down at a table and enjoy your food. This will help you recognize when you are full. It will also help you be more mindful of what and how much you are eating. What are tips for following this plan? Reading food labels  Check the calorie count compared with the serving size. The serving size may be smaller than what you are used to eating.  Check the source of the calories. Try to choose foods that are high in protein, fiber, and vitamins, and low in saturated fat, trans fat, and sodium. Shopping  Read nutrition labels while you shop. This will help you make healthy decisions about which foods to buy.  Pay attention to nutrition labels for low-fat or fat-free foods. These foods sometimes have the same number of calories or more calories than the full-fat versions. They also often have added sugar, starch, or salt to make up for flavor that was removed with the fat.  Make a grocery list of lower-calorie foods and stick to it. Cooking  Try to Monty your favorite foods in a healthier way. For example, try baking instead of frying.  Use low-fat dairy products. Meal planning  Use more fruits and vegetables. One-half of your plate should be fruits and vegetables.  Include lean proteins, such as chicken, turkey, and fish. Lifestyle Each week, aim to do one of the following:  150 minutes of moderate exercise, such as walking.  75 minutes of vigorous exercise, such as running. General information  Know how many calories are in the foods you eat most often. This will help you calculate calorie counts faster.  Find a way of tracking calories that works for you. Get creative. Try different apps or programs if writing down calories does not work for you. What foods should I eat?  Eat nutritious foods. It is better to have a nutritious,  high-calorie food, such as an avocado, than a food with few nutrients, such as a bag of potato chips.  Use your calories on foods and drinks that will fill you up and will not leave you hungry soon after eating. ? Examples of foods that fill you up are nuts and nut butters, vegetables, lean proteins, and high-fiber foods such as whole grains. High-fiber foods are foods with more than 5 g of fiber per serving.  Pay attention to calories in drinks. Low-calorie drinks include water and unsweetened drinks. The items listed above may not be a complete list of foods and beverages you can eat.   Contact a dietitian for more information.   What foods should I limit? Limit foods or drinks that are not good sources of vitamins, minerals, or protein or that are high in unhealthy fats. These include:  Candy.  Other sweets.  Sodas, specialty coffee drinks, alcohol, and juice. The items listed above may not be a complete list of foods and beverages you should avoid. Contact a dietitian for more information. How do I count calories when eating out?  Pay attention to portions. Often, portions are much larger when eating out. Try these tips to keep portions smaller: ? Consider sharing a meal instead of getting your own. ? If you get your own meal, eat only half of it. Before you start eating, ask for a container and put half of your meal into it. ? When available, consider ordering smaller portions from the menu instead of full portions.  Pay attention to your food and drink choices. Knowing the way food is cooked and what is included with the meal can help you eat fewer calories. ? If calories are listed on the menu, choose the lower-calorie options. ? Choose dishes that include vegetables, fruits, whole grains, low-fat dairy products, and lean proteins. ? Choose items that are boiled, broiled, grilled, or steamed. Avoid items that are buttered, battered, fried, or served with cream sauce. Items labeled as  crispy are usually fried, unless stated otherwise. ? Choose water, low-fat milk, unsweetened iced tea, or other drinks without added sugar. If you want an alcoholic beverage, choose a lower-calorie option, such as a glass of wine or light beer. ? Ask for dressings, sauces, and syrups on the side. These are usually high in calories, so you should limit the amount you eat. ? If you want a salad, choose a garden salad and ask for grilled meats. Avoid extra toppings such as bacon, cheese, or fried items. Ask for the dressing on the side, or ask for olive oil and vinegar or lemon to use as dressing.  Estimate how many servings of a food you are given. Knowing serving sizes will help you be aware of how much food you are eating at restaurants. Where to find more information  Centers for Disease Control and Prevention: www.cdc.gov  [place] Department of Agriculture: myplate.gov Summary  Calorie counting means keeping track of how many calories you eat and drink each day. If you eat fewer calories than your body needs, you should lose weight.  A healthy amount of weight to lose per week is usually 1-2 lb (0.5-0.9 kg). This usually means reducing your daily calorie intake by 500-750 calories.  The number of calories in a food can be found on a Nutrition Facts label. If a food does not have a Nutrition Facts label, try to look up the calories online or ask your dietitian for help.  Use smaller plates, glasses, and bowls for smaller portions and to prevent overeating.  Use your calories on foods and drinks that will fill you up and not leave you hungry shortly after a meal. This information is not intended to replace advice given to you by your health care provider. Make sure you discuss any questions you have with your health care provider. Document Revised: 06/08/2019 Document Reviewed: 06/08/2019 Elsevier Patient Education  2021 Elsevier Inc.  

## 2020-10-10 LAB — CMP14+EGFR
ALT: 14 IU/L (ref 0–32)
AST: 19 IU/L (ref 0–40)
Albumin/Globulin Ratio: 1.5 (ref 1.2–2.2)
Albumin: 4.4 g/dL (ref 3.8–4.8)
Alkaline Phosphatase: 67 IU/L (ref 44–121)
BUN/Creatinine Ratio: 15 (ref 9–23)
BUN: 13 mg/dL (ref 6–20)
Bilirubin Total: 0.3 mg/dL (ref 0.0–1.2)
CO2: 22 mmol/L (ref 20–29)
Calcium: 9.3 mg/dL (ref 8.7–10.2)
Chloride: 105 mmol/L (ref 96–106)
Creatinine, Ser: 0.84 mg/dL (ref 0.57–1.00)
Globulin, Total: 3 g/dL (ref 1.5–4.5)
Glucose: 82 mg/dL (ref 65–99)
Potassium: 4.2 mmol/L (ref 3.5–5.2)
Sodium: 140 mmol/L (ref 134–144)
Total Protein: 7.4 g/dL (ref 6.0–8.5)
eGFR: 92 mL/min/{1.73_m2} (ref >59)

## 2020-10-10 LAB — LIPID PANEL
Chol/HDL Ratio: 2.9 ratio (ref 0.0–4.4)
Cholesterol, Total: 176 mg/dL (ref 100–199)
HDL: 60 mg/dL (ref >39)
LDL Chol Calc (NIH): 104 mg/dL — ABNORMAL HIGH (ref 0–99)
Triglycerides: 61 mg/dL (ref 0–149)
VLDL Cholesterol Cal: 12 mg/dL (ref 5–40)

## 2020-10-10 LAB — HCV RNA QUANT RFLX ULTRA OR GENOTYP: HCV Quant Baseline: NOT DETECTED IU/mL

## 2020-10-24 ENCOUNTER — Other Ambulatory Visit: Payer: Self-pay

## 2020-10-24 ENCOUNTER — Ambulatory Visit (INDEPENDENT_AMBULATORY_CARE_PROVIDER_SITE_OTHER): Payer: Medicaid Other | Admitting: Family Medicine

## 2020-10-24 ENCOUNTER — Encounter: Payer: Self-pay | Admitting: Family Medicine

## 2020-10-24 VITALS — BP 129/84 | HR 63 | Temp 98.1°F | Resp 16 | Ht 62.01 in | Wt 226.4 lb

## 2020-10-24 DIAGNOSIS — N3000 Acute cystitis without hematuria: Secondary | ICD-10-CM

## 2020-10-24 LAB — POCT URINALYSIS DIP (CLINITEK)
Bilirubin, UA: NEGATIVE
Glucose, UA: NEGATIVE mg/dL
Ketones, POC UA: NEGATIVE mg/dL
Nitrite, UA: NEGATIVE
POC PROTEIN,UA: 30 — AB
Spec Grav, UA: 1.025 (ref 1.010–1.025)
Urobilinogen, UA: 1 E.U./dL
pH, UA: 6 (ref 5.0–8.0)

## 2020-10-24 MED ORDER — CEPHALEXIN 500 MG PO CAPS: 500.0000 mg | ORAL_CAPSULE | Freq: Two times a day (BID) | ORAL | 0 refills | Status: DC

## 2020-10-24 NOTE — Patient Instructions (Signed)

## 2020-10-24 NOTE — Progress Notes (Signed)
Pt presents for burning and pain during urination for approx 3 days

## 2020-10-24 NOTE — Progress Notes (Signed)
Subjective:  Patient ID: Krystal Chan, female    DOB: Aug 08, 1985  Age: 35 y.o. MRN: [id number]  CC: Urinary Tract Infection   HPI Fredricka Kohrs is a 35 year old female with a history of Hypertension seen for an acute visit.  Interval History: She has  had urinary frequency, dysuria, cloudy urine for the past 3 days Used AZO with no relief. Has had R flank pain as well but no nausea or vomiting or abdominal pain Denies presence of fever, hematuria.  Past Medical History:  Diagnosis Date   History of preterm delivery, currently pregnant 02/28/2015   [ ] 17-P   Hypertension    Umbilical hernia    repaired ~2004    Past Surgical History:  Procedure Laterality Date   CESAREAN SECTION MULTI-GESTATIONAL N/A 07/14/2017   Procedure: CESAREAN SECTION MULTI-GESTATIONAL;  Surgeon: 09/13/2017, DO;  Location: [facility];  Service: Obstetrics;  Laterality: N/A;   HERNIA REPAIR     gastic hernia 2005 and umbilical hernia 2004   LAPAROTOMY N/A 07/17/2017   Procedure: Exploratory Laparottomy, Evacuation of Abdominal Wall Hematoma;  Surgeon: 09/16/2017, MD;  Location: [facility];  Service: Gynecology;  Laterality: N/A;    Family History  Problem Relation Age of Onset   Breast cancer Maternal Grandmother    Hypertension Maternal Grandmother    Cancer Maternal Grandmother        breast   Hypertension Mother    Heart disease Mother    Hypertension Paternal Grandmother    Diabetes Paternal Grandmother    Diabetes Father    Hearing loss Neg Hx    Colon cancer Neg Hx    Stomach cancer Neg Hx    Liver disease Neg Hx    Pancreatic cancer Neg Hx    Esophageal cancer Neg Hx     No Known Allergies  Outpatient Medications Prior to Visit  Medication Sig Dispense Refill   amLODipine (NORVASC) 10 MG tablet Take 1 tablet (10 mg total) by mouth daily. 90 tablet 1   fluticasone (FLONASE) 50 MCG/ACT nasal spray Place 1 spray into both nostrils daily. (Patient not taking: No sig reported)  16 g 1   Facility-Administered Medications Prior to Visit  Medication Dose Route Frequency Provider Last Rate Last Admin   0.9 %  sodium chloride infusion  500 mL Intravenous Continuous Reva Bores, MD         ROS Review of Systems  Constitutional:  Negative for activity change, appetite change and fatigue.  HENT:  Negative for congestion, sinus pressure and sore throat.   Eyes:  Negative for visual disturbance.  Respiratory:  Negative for cough, chest tightness, shortness of breath and wheezing.   Cardiovascular:  Negative for chest pain and palpitations.  Gastrointestinal:  Negative for abdominal distention, abdominal pain and constipation.  Endocrine: Negative for polydipsia.  Genitourinary:  Positive for dysuria, flank pain and frequency.  Musculoskeletal:  Negative for arthralgias and back pain.  Skin:  Negative for rash.  Neurological:  Negative for tremors, light-headedness and numbness.  Hematological:  Does not bruise/bleed easily.  Psychiatric/Behavioral:  Negative for agitation and behavioral problems.    Objective:  BP 129/84 (BP Location: Left Arm, Patient Position: Sitting, Cuff Size: Large)   Pulse 63   Temp 98.1 F (36.7 C)   Resp 16   Ht 5' 2.01" (1.575 m)   Wt 226 lb 6.4 oz (102.7 kg)   SpO2 98%   BMI 41.40 kg/m   BP/Weight 10/24/2020  10/09/2020 09/26/2020  Systolic BP 129 144 123  Diastolic BP 84 83 84  Wt. (Lbs) 226.4 232 229  BMI 41.4 42.43 41.88      Physical Exam Constitutional:      Appearance: She is well-developed.  Neck:     Vascular: No JVD.  Cardiovascular:     Rate and Rhythm: Normal rate.     Heart sounds: Normal heart sounds. No murmur heard. Pulmonary:     Effort: Pulmonary effort is normal.     Breath sounds: Normal breath sounds. No wheezing or rales.  Chest:     Chest wall: No tenderness.  Abdominal:     General: Bowel sounds are normal. There is no distension.     Palpations: Abdomen is soft. There is no mass.      Tenderness: There is no abdominal tenderness. There is no right CVA tenderness or left CVA tenderness.  Musculoskeletal:        General: Normal range of motion.     Right lower leg: No edema.     Left lower leg: No edema.  Neurological:     Mental Status: She is alert and oriented to person, place, and time.  Psychiatric:        Mood and Affect: Mood normal.    CMP Latest Ref Rng & Units 10/09/2020 11/01/2019 12/30/2017  Glucose 65 - 99 mg/dL 82 88 89  BUN 6 - 20 mg/dL 13 12 11   Creatinine 0.57 - 1.00 mg/dL 9.83 3.82  Sodium 134 - 144 mmol/L 140 138 142  Potassium 3.5 - 5.2 mmol/L 4.2 4.0 3.8  Chloride 96 - 106 mmol/L 105 101 104  CO2 20 - 29 mmol/L 22 23 24   Calcium 8.7 - 10.2 mg/dL 9.3 9.4 9.2  Total Protein 6.0 - 8.5 g/dL 7.4 7.4 -  Total Bilirubin 0.0 - 1.2 mg/dL 0.3 0.4 -  Alkaline Phos 44 - 121 IU/L 67 63 -  AST 0 - 40 IU/L 19 16 -  ALT 0 - 32 IU/L 14 8 -    Lipid Panel     Component Value Date/Time   CHOL 176 10/09/2020 1140   TRIG 61 10/09/2020 1140   HDL 60 10/09/2020 1140   CHOLHDL 2.9 10/09/2020 1140   LDLCALC 104 (H) 10/09/2020 1140    CBC    Component Value Date/Time   WBC 6.9 12/30/2017 1004   WBC 11.9 (H) 07/19/2017 0553   RBC 4.62 12/30/2017 1004   RBC 2.89 (L) 07/19/2017 0553   HGB 13.6 12/30/2017 1004   HCT 41.3 12/30/2017 1004   PLT 259 12/30/2017 1004   MCV 89 12/30/2017 1004   MCH 29.4 12/30/2017 1004   MCH 28.0 07/19/2017 0553   MCHC 32.9 12/30/2017 1004   MCHC 32.5 07/19/2017 0553   RDW 14.7 12/30/2017 1004   LYMPHSABS 2.9 12/30/2017 1004   EOSABS 0.1 12/30/2017 1004   BASOSABS 0.0 12/30/2017 1004    Urinalysis    Component Value Date/Time   COLORURINE YELLOW 05/15/2017 0958   APPEARANCEUR HAZY (A) 05/15/2017 0958   LABSPEC 1.017 05/15/2017 0958   PHURINE 6.0 05/15/2017 0958   GLUCOSEU NEGATIVE 05/15/2017 0958   HGBUR NEGATIVE 05/15/2017 0958   BILIRUBINUR negative 10/24/2020 1109   KETONESUR negative 10/24/2020 1109    KETONESUR 5 (A) 05/15/2017 0958   PROTEINUR NEGATIVE 05/15/2017 0958   UROBILINOGEN 1.0 10/24/2020 1109   UROBILINOGEN 1.0 01/28/2017 1344   NITRITE Negative 10/24/2020 1109   NITRITE NEGATIVE 05/15/2017 10/26/2020  LEUKOCYTESUR Small (1+) (A) 10/24/2020 1109      Assessment & Plan:  1. Acute cystitis without hematuria UA positive for UTI Increase fluid intake - cephALEXin (KEFLEX) 500 MG capsule; Take 1 capsule (500 mg total) by mouth 2 (two) times daily.  Dispense: 10 capsule; Refill: 0 - POCT URINALYSIS DIP (CLINITEK)   Meds ordered this encounter  Medications   cephALEXin (KEFLEX) 500 MG capsule    Sig: Take 1 capsule (500 mg total) by mouth 2 (two) times daily.    Dispense:  10 capsule    Refill:  0    Follow-up: Return if symptoms worsen or fail to improve, for medical conditions, keep previously scheduled appointment.       [name], MD, FAAFP. [facility] [place], [place] [phone]   10/24/2020, 1:50 PM

## 2020-10-27 ENCOUNTER — Encounter: Payer: Self-pay | Admitting: Family Medicine

## 2020-10-28 ENCOUNTER — Other Ambulatory Visit: Payer: Self-pay | Admitting: Family Medicine

## 2020-10-28 DIAGNOSIS — N3001 Acute cystitis with hematuria: Secondary | ICD-10-CM

## 2020-10-28 MED ORDER — NITROFURANTOIN MONOHYD MACRO 100 MG PO CAPS: 100.0000 mg | ORAL_CAPSULE | Freq: Two times a day (BID) | ORAL | 0 refills | Status: DC

## 2020-10-31 ENCOUNTER — Encounter: Payer: Self-pay | Admitting: Family Medicine

## 2020-11-07 ENCOUNTER — Other Ambulatory Visit: Payer: Self-pay

## 2020-11-07 ENCOUNTER — Encounter (HOSPITAL_COMMUNITY): Payer: Self-pay | Admitting: Emergency Medicine

## 2020-11-07 ENCOUNTER — Emergency Department (HOSPITAL_COMMUNITY)
Admission: EM | Admit: 2020-11-07 | Discharge: 2020-11-07 | Disposition: A | Discharge status: Home / Self Care | Payer: Medicaid Other | Attending: Emergency Medicine | Admitting: Emergency Medicine

## 2020-11-07 DIAGNOSIS — R109 Unspecified abdominal pain: Secondary | ICD-10-CM | POA: Diagnosis not present

## 2020-11-07 DIAGNOSIS — S29012A Strain of muscle and tendon of back wall of thorax, initial encounter: Secondary | ICD-10-CM

## 2020-11-07 DIAGNOSIS — N3000 Acute cystitis without hematuria: Secondary | ICD-10-CM

## 2020-11-07 DIAGNOSIS — R350 Frequency of micturition: Secondary | ICD-10-CM | POA: Insufficient documentation

## 2020-11-07 DIAGNOSIS — R300 Dysuria: Secondary | ICD-10-CM | POA: Insufficient documentation

## 2020-11-07 DIAGNOSIS — Z79899 Other long term (current) drug therapy: Secondary | ICD-10-CM | POA: Insufficient documentation

## 2020-11-07 DIAGNOSIS — I10 Essential (primary) hypertension: Secondary | ICD-10-CM | POA: Insufficient documentation

## 2020-11-07 LAB — URINALYSIS, ROUTINE W REFLEX MICROSCOPIC
Bilirubin Urine: NEGATIVE
Glucose, UA: NEGATIVE mg/dL
Ketones, ur: NEGATIVE mg/dL
Nitrite: NEGATIVE
Protein, ur: NEGATIVE mg/dL
Specific Gravity, Urine: 1.01 (ref 1.005–1.030)
WBC, UA: >50 WBC/hpf — ABNORMAL HIGH (ref 0–5)
pH: 5 (ref 5.0–8.0)

## 2020-11-07 LAB — CBC
HCT: 39 % (ref 36.0–46.0)
Hemoglobin: 12.3 g/dL (ref 12.0–15.0)
MCH: 29.1 pg (ref 26.0–34.0)
MCHC: 31.5 g/dL (ref 30.0–36.0)
MCV: 92.2 fL (ref 80.0–100.0)
Platelets: 237 10*3/uL (ref 150–400)
RBC: 4.23 MIL/uL (ref 3.87–5.11)
RDW: 13.9 % (ref 11.5–15.5)
WBC: 9 10*3/uL (ref 4.0–10.5)
nRBC: 0 % (ref 0.0–0.2)

## 2020-11-07 LAB — BASIC METABOLIC PANEL
Anion gap: 9 (ref 5–15)
BUN: 8 mg/dL (ref 6–20)
CO2: 26 mmol/L (ref 22–32)
Calcium: 9.5 mg/dL (ref 8.9–10.3)
Chloride: 102 mmol/L (ref 98–111)
Creatinine, Ser: 0.82 mg/dL (ref 0.44–1.00)
GFR, Estimated: >60 mL/min (ref >60)
Glucose, Bld: 113 mg/dL — ABNORMAL HIGH (ref 70–99)
Potassium: 3.3 mmol/L — ABNORMAL LOW (ref 3.5–5.1)
Sodium: 137 mmol/L (ref 135–145)

## 2020-11-07 LAB — PREGNANCY, URINE: Preg Test, Ur: NEGATIVE

## 2020-11-07 MED ORDER — METHOCARBAMOL 500 MG PO TABS: 500.0000 mg | ORAL_TABLET | Freq: Two times a day (BID) | ORAL | 0 refills | Status: DC

## 2020-11-07 MED ORDER — SULFAMETHOXAZOLE-TRIMETHOPRIM 800-160 MG PO TABS: 1.0000 | ORAL_TABLET | Freq: Two times a day (BID) | ORAL | 0 refills | Status: AC

## 2020-11-07 NOTE — ED Provider Notes (Signed)
[facility] EMERGENCY DEPARTMENT Provider Note   CSN: [id number] Arrival date & time: 11/07/20  0415     History Chief Complaint  Patient presents with   Dysuria    Krystal Chan is a 35 y.o. female with a history of hypertension and anemia who presents to the emergency department with a chief complaint of right flank pain.  The patient reports that she has been having intermittent right flank pain for the last 2 weeks.  She also endorses urinary frequency and dysuria.  She was seen by her PCP and started on Keflex for UTI.  Her symptoms did not improve so her antibiotic was changed to nitrofurantoin.  Unfortunately, her symptoms have not improved.  She reports that the pain is brought on with positional changes.  No other known aggravating or alleviating factors.  She denies fever, chills, nausea, vomiting, diarrhea, constipation, vaginal bleeding or discharge, chest pain, shortness of breath, hematuria.  She has been taking Tylenol with good improvement of her symptoms.  She reports that pain will resolve with Tylenol temporarily.  The patient also notes that she began walking for exercise about a month ago.  She denies any recent falls or injuries.  No other new exercises or activities.  No history of UTIs.  The history is provided by the patient and medical records. No language interpreter was used.      Past Medical History:  Diagnosis Date   History of preterm delivery, currently pregnant 02/28/2015   [ ] 17-P   Hypertension    Umbilical hernia    repaired ~2004    Patient Active Problem List   Diagnosis Date Noted   Hypertension 06/22/2018   Anemia 12/30/2017    Past Surgical History:  Procedure Laterality Date   CESAREAN SECTION MULTI-GESTATIONAL N/A 07/14/2017   Procedure: CESAREAN SECTION MULTI-GESTATIONAL;  Surgeon: 09/13/2017, DO;  Location: [facility];  Service: Obstetrics;  Laterality: N/A;   HERNIA REPAIR     gastic hernia 2005 and  umbilical hernia 2004   LAPAROTOMY N/A 07/17/2017   Procedure: Exploratory Laparottomy, Evacuation of Abdominal Wall Hematoma;  Surgeon: 09/16/2017, MD;  Location: [facility] ORS;  Service: Gynecology;  Laterality: N/A;     OB History     Gravida  6   Para  4   Term  2   Preterm  2   AB  2   Living  5      SAB      IAB  2   Ectopic      Multiple  1   Live Births  5           Family History  Problem Relation Age of Onset   Breast cancer Maternal Grandmother    Hypertension Maternal Grandmother    Cancer Maternal Grandmother        breast   Hypertension Mother    Heart disease Mother    Hypertension Paternal Grandmother    Diabetes Paternal Grandmother    Diabetes Father    Hearing loss Neg Hx    Colon cancer Neg Hx    Stomach cancer Neg Hx    Liver disease Neg Hx    Pancreatic cancer Neg Hx    Esophageal cancer Neg Hx     Social History   Tobacco Use   Smoking status: Never   Smokeless tobacco: Never  Vaping Use   Vaping Use: Never used  Substance Use Topics   Alcohol use: No  Comment: ocassionally   Drug use: No    Home Medications Prior to Admission medications   Medication Sig Start Date End Date Taking? Authorizing Provider  amLODipine (NORVASC) 10 MG tablet Take 1 tablet (10 mg total) by mouth daily. 10/09/20   Hoy RegisterNewlin, Enobong, MD  cephALEXin (KEFLEX) 500 MG capsule Take 1 capsule (500 mg total) by mouth 2 (two) times daily. 10/24/20   Hoy RegisterNewlin, Enobong, MD  fluticasone (FLONASE) 50 MCG/ACT nasal spray Place 1 spray into both nostrils daily. Patient not taking: No sig reported 02/15/19   Hoy RegisterNewlin, Enobong, MD  nitrofurantoin, macrocrystal-monohydrate, (MACROBID) 100 MG capsule Take 1 capsule (100 mg total) by mouth 2 (two) times daily. 10/28/20   Hoy RegisterNewlin, Enobong, MD    Allergies    Patient has no known allergies.  Review of Systems   Review of Systems  Constitutional:  Negative for activity change, chills, diaphoresis and fever.  HENT:   Negative for congestion and sore throat.   Eyes:  Negative for visual disturbance.  Respiratory:  Negative for cough, shortness of breath and wheezing.   Cardiovascular:  Negative for chest pain.  Gastrointestinal:  Negative for abdominal pain, diarrhea, nausea and vomiting.  Genitourinary:  Positive for flank pain and frequency. Negative for decreased urine volume, dysuria, hematuria, menstrual problem, urgency, vaginal discharge and vaginal pain.  Musculoskeletal:  Positive for back pain and myalgias. Negative for arthralgias, neck pain and neck stiffness.  Skin:  Negative for color change, rash and wound.  Allergic/Immunologic: Negative for immunocompromised state.  Neurological:  Negative for tremors, syncope, weakness, numbness and headaches.  Psychiatric/Behavioral:  Negative for confusion.    Physical Exam Updated Vital Signs BP 140/87 (BP Location: Right Arm)   Pulse 69   Temp 99 F (37.2 C) (Oral)   Resp 16   SpO2 99%   Physical Exam Vitals and nursing note reviewed.  Constitutional:      General: She is not in acute distress.    Appearance: She is not ill-appearing, toxic-appearing or diaphoretic.  HENT:     Head: Normocephalic.  Eyes:     Conjunctiva/sclera: Conjunctivae normal.  Cardiovascular:     Rate and Rhythm: Normal rate and regular rhythm.     Heart sounds: No murmur heard.   No friction rub. No gallop.  Pulmonary:     Effort: Pulmonary effort is normal. No respiratory distress.     Breath sounds: No stridor. No wheezing, rhonchi or rales.  Chest:     Chest wall: No tenderness.  Abdominal:     General: There is no distension.     Palpations: Abdomen is soft. There is no mass.     Tenderness: There is no abdominal tenderness. There is no right CVA tenderness, left CVA tenderness, guarding or rebound.     Hernia: No hernia is present.     Comments: Abdomen is soft, nontender, nondistended.  Normoactive bowel sounds.  Musculoskeletal:        General: No  tenderness.       Arms:     Cervical back: Neck supple.     Right lower leg: No edema.     Left lower leg: No edema.     Comments: Diffusely tender to palpation over the right latissimus dorsi.  There is no focal tenderness to palpation over the right CVA region.  No redness, warmth, or rashes.  No tenderness palpation to the cervical, thoracic, or lumbar spinous processes or bilateral paraspinal muscles.  Skin:    General: Skin is  warm.     Coloration: Skin is not jaundiced or pale.     Findings: No rash.  Neurological:     Mental Status: She is alert.  Psychiatric:        Behavior: Behavior normal.    ED Results / Procedures / Treatments   Labs (all labs ordered are listed, but only abnormal results are displayed) Labs Reviewed  BASIC METABOLIC PANEL - Abnormal; Notable for the following components:      Result Value   Potassium 3.3 (*)    Glucose, Bld 113 (*)    All other components within normal limits  URINALYSIS, ROUTINE W REFLEX MICROSCOPIC - Abnormal; Notable for the following components:   APPearance CLOUDY (*)    Hgb urine dipstick SMALL (*)    Leukocytes,Ua LARGE (*)    WBC, UA >50 (*)    Bacteria, UA RARE (*)    All other components within normal limits  URINE CULTURE  CBC  PREGNANCY, URINE    EKG None  Radiology No results found.  Procedures Procedures   Medications Ordered in ED Medications - No data to display  ED Course  I have reviewed the triage vital signs and the nursing notes.  Pertinent labs & imaging results that were available during my care of the patient were reviewed by me and considered in my medical decision making (see chart for details).    MDM Rules/Calculators/A&P                          35 year old female with a history of hypertension and anemia who presents to the emergency department with a 2-week history of right flank pain and urinary frequency and dysuria.  Patient was previously treated for UTI with Keflex and  Macrobid without improvement in symptoms.  Patient's record was reviewed and no urine culture was obtained.  She does not have any constitutional symptoms, but symptoms have not been improving despite compliance with antibiotics.  Vital signs are stable. The patient was discussed with Dr. Nicanor Alcon, attending physician.  On exam, she is diffusely tender to palpation over the right latissimus dorsi.  She does not have focal tenderness over the right CVA region.  Abdomen is benign.  Labs have been reviewed and independently interpreted by me.  No leukocytosis.  No metabolic derangements.  Pregnancy test is negative.  UA with leukocyte esterase and pyuria.  Nitrite negative.  She does have a small amount of hemoglobinuria, but this is unchanged from previous.  Urine culture has been sent.  Did consider sterile pyuria, but clinical presentation does not seem consistent with appendicitis.  She is having no vaginal complaints, doubt PID or tubo-ovarian abscess.  Could have hemoglobinuria from previous small right nephrolithiasis seen on CT abdomen pelvis in January of this year.  Overall, her abdomen is benign.  She does have diffuse tenderness to palpation to the right latissimus dorsi muscle and does report that she began walking for exercise within 2 weeks of when her symptoms began.  Could also be a component of musculoskeletal strain.  Will recommend stretching, RICE therapy, OTC analgesia and will change her antibiotic since culture is not available at this time.  She will follow-up with her PCP.  At this time, the patient is hemodynamically stable and in no acute distress.  Safe for discharge to home with outpatient follow-up as discussed.  Final Clinical Impression(s) / ED Diagnoses Final diagnoses:  None    Rx / DC  Orders ED Discharge Orders     None        Barkley Boards, PA-C 11/07/20 0749    Palumbo, April, MD 11/08/20 [id number]

## 2020-11-07 NOTE — ED Triage Notes (Signed)
Pt arrive POV c/o dysuria and frequency for the past 2 weeks, was seen by [facility] and prescribed 2 different abx with no relief.

## 2020-11-07 NOTE — Discharge Instructions (Addendum)
Thank you for allowing me to care for you today in the Emergency Department.   We sent your urine for culture.  You can follow the culture results in MyChart.  You should also receive a call from the hospital if the culture is positive.  Switch her antibiotic to Bactrim.  Take 1 tablet by mouth 2 times daily for 3 days.  Please make sure to follow-up with Dr. Alvis Lemmings for recheck.  A component of your pain does also seem consistent with a latissimus dorsi strain.  There are many stretches for this muscle that you can find online as well as videos on how to perform the stretches.  Take 650 mg of Tylenol or 600 mg of ibuprofen with food every 6 hours for pain.  You can alternate between these 2 medications every 3 hours if your pain returns.  For instance, you can take Tylenol at noon, followed by a dose of ibuprofen at 3, followed by second dose of Tylenol and 6.  You can try taking 1 tablet of Robaxin 2 times daily as needed for muscle pain and spasms.  Do not work or drive until you know how this medication impacts you.  Return to the emergency department if you start having fevers with worsening urinary discomfort, severe abdominal pain, or other new, concerning symptoms.

## 2020-11-08 ENCOUNTER — Telehealth: Payer: Self-pay

## 2020-11-08 NOTE — Telephone Encounter (Signed)
Transition Care Management Unsuccessful Follow-up Telephone Call  Date of discharge and from where:  11/07/2020 from [place]  Attempts:  1st Attempt  Reason for unsuccessful TCM follow-up call:  Left voice message    

## 2020-11-09 LAB — URINE CULTURE: Culture: 20000 — AB

## 2020-11-10 ENCOUNTER — Telehealth: Payer: Self-pay | Admitting: Emergency Medicine

## 2020-11-10 NOTE — Telephone Encounter (Signed)
Post ED Visit - Positive Culture Follow-up  Culture report reviewed by antimicrobial stewardship pharmacist: [facility] []  , Pharm.D. []  Enzo Bi, Pharm.D., BCPS AQ-ID []  , Pharm.D., BCPS []  Celedonio Miyamoto, Pharm.D., BCPS []  [place], Garvin Fila.D., BCPS, AAHIVP []  , Pharm.D., BCPS, AAHIVP []  Georgina Pillion, PharmD, BCPS []  , PharmD, BCPS []  [place], PharmD, BCPS [x]  [address], PharmD []  , PharmD, BCPS []  Estella Husk, PharmD  Pharmacy Team []  Lysle Pearl, PharmD []  , PharmD []  Phillips Climes, PharmD []  , Rph []  Agapito Games) , PharmD []  Delmar Landau, PharmD []  , PharmD []  Mervyn Gay, PharmD []  , PharmD []  Vinnie Level, PharmD []  [facility], PharmD []  , PharmD []  Len Childs, PharmD   Positive urine culture Treated with Sulfamethoxazole-trimethoprim, organism sensitive to the same and no further patient follow-up is required at this time.  Raine Elsass 11/10/2020, 6:15 PM

## 2020-11-12 NOTE — Telephone Encounter (Signed)
Transition Care Management Unsuccessful Follow-up Telephone Call  Date of discharge and from where:  11/07/2020 from [facility]  Attempts:  2nd Attempt  Reason for unsuccessful TCM follow-up call:  Left voice message

## 2020-11-13 NOTE — Telephone Encounter (Signed)
Transition Care Management Unsuccessful Follow-up Telephone Call  Date of discharge and from where:  11/07/2020 - [facility]  Attempts:  3rd Attempt  Reason for unsuccessful TCM follow-up call:  Voice mail full

## 2020-11-19 ENCOUNTER — Ambulatory Visit: Payer: Medicaid Other

## 2020-11-21 ENCOUNTER — Ambulatory Visit (INDEPENDENT_AMBULATORY_CARE_PROVIDER_SITE_OTHER): Payer: Medicaid Other | Admitting: Nurse Practitioner

## 2020-11-21 VITALS — BP 143/91 | HR 69 | Temp 97.9°F | Resp 18

## 2020-11-21 DIAGNOSIS — N3000 Acute cystitis without hematuria: Secondary | ICD-10-CM | POA: Insufficient documentation

## 2020-11-21 NOTE — Progress Notes (Signed)
@Patient  ID: , female    DOB: [birth year], 35 y.o.   MRN: [id number]  Chief Complaint  Patient presents with   Hospitalization Follow-up    Referring provider: [id number], MD  HPI  Patient presents today for hospital follow-up/transitional care.  Patient was recently seen in the ED on 11/07/2020 for UTI.  Patient presented with a 2-week history of right flank pain and urinary frequency patient was previously treated with Keflex and Macrobid.  Patient was prescribed 3 days of Bactrim in the ED.  Urine culture come back and was resistant to Macrobid.  Patient states that she is much improved.  She does still have slight right flank pain.  She was compliant with her medications and has been try to stay well-hydrated.  We will repeat a UA and urine culture today. Denies f/c/s, n/v/d, hemoptysis, PND, chest pain or edema.     No Known Allergies  Immunization History  Administered Date(s) Administered   Influenza,inj,Quad PF,6+ Mos 06/06/2015, 02/23/2017   Tdap 06/06/2015, 05/20/2017    Past Medical History:  Diagnosis Date   History of preterm delivery, currently pregnant 02/28/2015   [ ] 17-P   Hypertension    Umbilical hernia    repaired ~2004    Tobacco History: Social History   Tobacco Use  Smoking Status Never  Smokeless Tobacco Never   Counseling given: Yes   Outpatient Encounter Medications as of 11/21/2020  Medication Sig   amLODipine (NORVASC) 10 MG tablet Take 1 tablet (10 mg total) by mouth daily.   cephALEXin (KEFLEX) 500 MG capsule Take 1 capsule (500 mg total) by mouth 2 (two) times daily.   methocarbamol (ROBAXIN) 500 MG tablet Take 1 tablet (500 mg total) by mouth 2 (two) times daily.   nitrofurantoin, macrocrystal-monohydrate, (MACROBID) 100 MG capsule Take 1 capsule (100 mg total) by mouth 2 (two) times daily.   Facility-Administered Encounter Medications as of 11/21/2020  Medication   0.9 %  sodium chloride infusion     Review of  Systems  Review of Systems  Constitutional: Negative.   HENT: Negative.    Respiratory:  Negative for cough and shortness of breath.   Cardiovascular: Negative.   Gastrointestinal: Negative.  Negative for abdominal distention and abdominal pain.  Genitourinary:  Positive for flank pain. Negative for difficulty urinating, dyspareunia and dysuria.  Allergic/Immunologic: Negative.   Neurological: Negative.   Psychiatric/Behavioral: Negative.        Physical Exam  BP (!) 143/91   Pulse 69   Temp 97.9 F (36.6 C)   Resp 18   SpO2 100%   Wt Readings from Last 5 Encounters:  10/24/20 226 lb 6.4 oz (102.7 kg)  10/09/20 232 lb (105.2 kg)  09/26/20 229 lb (103.9 kg)  09/11/20 229 lb 9.6 oz (104.1 kg)  06/21/20 253 lb 8.5 oz (115 kg)     Physical Exam Vitals and nursing note reviewed.  Constitutional:      General: She is not in acute distress.    Appearance: She is well-developed.  Cardiovascular:     Rate and Rhythm: Normal rate and regular rhythm.  Pulmonary:     Effort: Pulmonary effort is normal.     Breath sounds: Normal breath sounds.  Musculoskeletal:     Right lower leg: No edema.     Left lower leg: No edema.  Neurological:     Mental Status: She is alert and oriented to person, place, and time.  Psychiatric:        Mood  and Affect: Mood normal.        Behavior: Behavior normal.       Assessment & Plan:   Acute cystitis without hematuria Stay well hydrated  Will order repeat UA and culture  Stay active   Follow up:  Follow up with PCP as scheduled     Ivonne Andrew, NP 11/21/2020

## 2020-11-21 NOTE — Assessment & Plan Note (Signed)
Stay well hydrated  Will order repeat UA and culture  Stay active   Follow up:  Follow up with PCP as scheduled

## 2020-11-21 NOTE — Patient Instructions (Signed)
UTI:  Stay well hydrated  Will order repeat UA and culture  Stay active   Follow up:  Follow up with PCP as scheduled    Urinary Tract Infection, Adult A urinary tract infection (UTI) is an infection of any part of the urinary tract. The urinary tract includes: The kidneys. The ureters. The bladder. The urethra. These organs make, store, and get rid of pee (urine) in the body. What are the causes? This infection is caused by germs (bacteria) in your genital area. These germs grow and cause swelling (inflammation) of your urinary tract. What increases the risk? The following factors may make you more likely to develop this condition: Using a small, thin tube (catheter) to drain pee. Not being able to control when you pee or poop (incontinence). Being female. If you are female, these things can increase the risk: Using these methods to prevent pregnancy: A medicine that kills sperm (spermicide). A device that blocks sperm (diaphragm). Having low levels of a female hormone (estrogen). Being pregnant. You are more likely to develop this condition if: You have genes that add to your risk. You are sexually active. You take antibiotic medicines. You have trouble peeing because of: A prostate that is bigger than normal, if you are female. A blockage in the part of your body that drains pee from the bladder. A kidney stone. A nerve condition that affects your bladder. Not getting enough to drink. Not peeing often enough. You have other conditions, such as: Diabetes. A weak disease-fighting system (immune system). Sickle cell disease. Gout. Injury of the spine. What are the signs or symptoms? Symptoms of this condition include: Needing to pee right away. Peeing small amounts often. Pain or burning when peeing. Blood in the pee. Pee that smells bad or not like normal. Trouble peeing. Pee that is cloudy. Fluid coming from the vagina, if you are female. Pain in the belly  or lower back. Other symptoms include: Vomiting. Not feeling hungry. Feeling mixed up (confused). This may be the first symptom in older adults. Being tired and grouchy (irritable). A fever. Watery poop (diarrhea). How is this treated? Taking antibiotic medicine. Taking other medicines. Drinking enough water. In some cases, you may need to see a specialist. Follow these instructions at home:  Medicines Take over-the-counter and prescription medicines only as told by your doctor. If you were prescribed an antibiotic medicine, take it as told by your doctor. Do not stop taking it even if you start to feel better. General instructions Make sure you: Pee until your bladder is empty. Do not hold pee for a long time. Empty your bladder after sex. Wipe from front to back after peeing or pooping if you are a female. Use each tissue one time when you wipe. Drink enough fluid to keep your pee pale yellow. Keep all follow-up visits. Contact a doctor if: You do not get better after 1-2 days. Your symptoms go away and then come back. Get help right away if: You have very bad back pain. You have very bad pain in your lower belly. You have a fever. You have chills. You feeling like you will vomit or you vomit. Summary A urinary tract infection (UTI) is an infection of any part of the urinary tract. This condition is caused by germs in your genital area. There are many risk factors for a UTI. Treatment includes antibiotic medicines. Drink enough fluid to keep your pee pale yellow. This information is not intended to replace advice given to  you by your health care provider. Make sure you discuss any questions you have with your healthcare provider. Document Revised: 12/08/2019 Document Reviewed: 12/08/2019 Elsevier Patient Education  [place].

## 2020-12-09 ENCOUNTER — Telehealth (INDEPENDENT_AMBULATORY_CARE_PROVIDER_SITE_OTHER): Payer: Self-pay

## 2020-12-09 NOTE — Telephone Encounter (Signed)
Can patient be worked in sooner or will she have to wait until next available appt slot. Please contact to advise. Maryjean Morn, CMA    Copied from CRM [id number]. Topic: Appointment Scheduling - Scheduling Inquiry for Clinic >> Dec 09, 2020 12:53 PM Aretta Nip wrote: Reason for CRM: Pt is calling stating has gotten a job with [employer] and is required to have Health Physical and TB Test done by 8/9.Marland Kitchen No available FU to pt at [phone]

## 2020-12-10 NOTE — Telephone Encounter (Signed)
Pt has been sent a mychart  regarding appointment date and time

## 2020-12-17 ENCOUNTER — Ambulatory Visit: Payer: Medicaid Other | Attending: Family Medicine | Admitting: Family Medicine

## 2020-12-17 ENCOUNTER — Other Ambulatory Visit: Payer: Self-pay

## 2020-12-17 ENCOUNTER — Encounter: Payer: Self-pay | Admitting: Family Medicine

## 2020-12-17 VITALS — BP 122/75 | HR 74 | Ht 62.0 in | Wt 232.0 lb

## 2020-12-17 DIAGNOSIS — Z111 Encounter for screening for respiratory tuberculosis: Secondary | ICD-10-CM | POA: Insufficient documentation

## 2020-12-17 DIAGNOSIS — N3001 Acute cystitis with hematuria: Secondary | ICD-10-CM

## 2020-12-17 DIAGNOSIS — Z8249 Family history of ischemic heart disease and other diseases of the circulatory system: Secondary | ICD-10-CM | POA: Insufficient documentation

## 2020-12-17 DIAGNOSIS — N3000 Acute cystitis without hematuria: Secondary | ICD-10-CM | POA: Diagnosis not present

## 2020-12-17 DIAGNOSIS — Z79899 Other long term (current) drug therapy: Secondary | ICD-10-CM | POA: Insufficient documentation

## 2020-12-17 DIAGNOSIS — Z029 Encounter for administrative examinations, unspecified: Secondary | ICD-10-CM | POA: Diagnosis not present

## 2020-12-17 DIAGNOSIS — I10 Essential (primary) hypertension: Secondary | ICD-10-CM | POA: Insufficient documentation

## 2020-12-17 NOTE — Patient Instructions (Signed)
Tuberculin Skin Test Why am I having this test? The tuberculin skin test is used to check whether a person has been exposed to the bacteria that causes tuberculosis (TB) (Mycobacterium tuberculosis). Tuberculosis is a bacterial infection that usually affects the lungs but can affect other parts of the body. You may have a tuberculin skin test if: You have possible symptoms of TB, such as: Coughing up blood, mucus from the lungs (sputum), or both. A cough that lasts three weeks or longer. Chest pain, or pain while breathing or coughing. Unexplained weight loss. Fatigue and weakness. Fever, sweating, and chills. Loss of appetite. You are at high risk for getting TB. You may be at high risk if you: Inject illegal drugs or share needles. Have HIV or other diseases that affect the body's disease-fighting system (immune system). Work in a health care facility. Live in a high-risk community, such as a homeless shelter, nursing home, or correctional facility. Have had contact with someone who has TB. Are from or have traveled to a country where TB is common. If you are at high risk, you may need to have regular TB screenings. TB screening may be required when starting a new job, such as becoming a health care worker or a teacher. Colleges or universities may require TB screening fornew students. What is being tested? This test checks for the presence of TB antibodies in the body. Antibodies are part of the body's immune system. After you get an infection, your body makes antibodies that stay in your body after you recover and protect you fromgetting the same infection again. Tell a health care provider about: Any allergies you have. All medicines you are taking, including vitamins, herbs, eye drops, creams, and over-the-counter medicines. Any blood disorders you have. Any surgeries you have had. Any medical conditions you have. Whether you are pregnant or may be pregnant. What happens during the  test?  Your health care provider will inject a solution called PPD (purified protein derivative) under the first layer of skin on your arm. This causes a small, blister-like bump to form over the area temporarily. PPD is made from the bacteria that causes TB. PPD causes your immune system to react, but it does not get you sickwith TB. You may feel mild stinging as this happens. Afterward, the area may itch orburn. How are the results reported? To get your test results, you will need to see your health care provider again within 2-3 days after you received the injection. It is important to follow your health care provider's instructions about when to be seen again. If you are not seen within 2-3 days, you may need to have the test repeated. At your follow-up visit, your health care provider will measure the area where the PPD was injected to see if the bump has gotten larger due to swelling. Your results will be reported as positive or negative. If the bump has disappeared or is small, your test result is negative. Negative means that you do not have the antibodies. If the bump is large, your test result is positive. Positive means that you have the antibodies. Swelling is caused by the antibodies reacting with the PPD. The skin may also turn red around the bump. A false-positive result can occur. A false positive is incorrect because itmeans that a condition is present when it is not. A false-negative result can occur. A false negative is incorrect because it means that a condition is not present when it is. False negatives are   rare and are more likely to occur in older people and in people who have weakened immunesystems. What do the results mean? A negative result means that it is unlikely that you have TB or that you have been exposed to TB bacteria. This test may be repeated, or you may have a blood test to check for TB. This is because your body may not react to the tuberculinskin test until several  weeks after exposure to TB bacteria. A positive result means that you have been exposed to TB, and you may need more tests to determine if you have: Active TB, also called TB disease. This means that you have TB symptoms and your infection can spread to others (you are contagious). Latent TB. This means that you do not have any symptoms of TB and you are not contagious. Latent TB can turn into active TB. Talk with your health care provider about what your results mean. Questions to ask your health care provider Ask your health care provider, or the department that is doing the test: When will my results be ready? How will I get my results? What are my treatment options? What other tests do I need? What are my next steps? Summary The tuberculin skin test is used to check whether a person has been exposed to the bacteria that causes tuberculosis (TB). Your health care provider will inject a solution known as PPD (purified protein derivative) under the first layer of skin on your arm. After 2-3 days, your health care provider will measure the area where the PPD was injected to see if the bump has gotten larger due to swelling. Your results will be reported as positive or negative. A positive result means that you have been exposed to TB. A negative result means that it is unlikely that you have TB or that you have been exposed to TB bacteria. This information is not intended to replace advice given to you by your health care provider. Make sure you discuss any questions you have with your healthcare provider. Document Revised: 01/18/2020 Document Reviewed: 12/28/2019 Elsevier Patient Education  2022 Elsevier Inc.  

## 2020-12-17 NOTE — Progress Notes (Signed)
Subjective:  Patient ID: Krystal Chan, female    DOB: 12-01-1985  Age: 35 y.o. MRN: [id number]  CC: Hypertension   HPI Krystal Chan is a 35 y.o. year old female with a history of hypertension.  Interval History: She presents requesting completion of paperwork for physical exam required by [facility].  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  PPD test is also required. Last month she did have a protracted case of a UTI which required 3 rounds of antibiotic-Keflex, nitrofurantoin, Bactrim for resolution. She feels fine at this time. Denies additional concerns. Past Medical History:  Diagnosis Date   History of preterm delivery, currently pregnant 02/28/2015   [ ] 17-P   Hypertension    Umbilical hernia    repaired ~2004    Past Surgical History:  Procedure Laterality Date   CESAREAN SECTION MULTI-GESTATIONAL N/A 07/14/2017   Procedure: CESAREAN SECTION MULTI-GESTATIONAL;  Surgeon: 09/13/2017, DO;  [facility];  Service: Obstetrics;  Laterality: N/A;   HERNIA REPAIR     gastic hernia 2005 and umbilical hernia 2004   LAPAROTOMY N/A 07/17/2017   Procedure: Exploratory Laparottomy, Evacuation of Abdominal Wall Hematoma;  Surgeon: 09/16/2017, MD;  Location: [facility];  Service: Gynecology;  Laterality: N/A;    Family History  Problem Relation Age of Onset   Breast cancer Maternal Grandmother    Hypertension Maternal Grandmother    Cancer Maternal Grandmother        breast   Hypertension Mother    Heart disease Mother    Hypertension Paternal Grandmother    Diabetes Paternal Grandmother    Diabetes Father    Hearing loss Neg Hx    Colon cancer Neg Hx    Stomach cancer Neg Hx    Liver disease Neg Hx    Pancreatic cancer Neg Hx    Esophageal cancer Neg Hx     No Known Allergies  Outpatient Medications Prior to Visit  Medication Sig Dispense Refill   amLODipine (NORVASC) 10 MG tablet Take 1 tablet (10 mg total) by mouth daily. 90 tablet 1    methocarbamol (ROBAXIN) 500 MG tablet Take 1 tablet (500 mg total) by mouth 2 (two) times daily. (Patient not taking: Reported on 12/17/2020) 20 tablet 0   nitrofurantoin, macrocrystal-monohydrate, (MACROBID) 100 MG capsule Take 1 capsule (100 mg total) by mouth 2 (two) times daily. (Patient not taking: Reported on 12/17/2020) 14 capsule 0   cephALEXin (KEFLEX) 500 MG capsule Take 1 capsule (500 mg total) by mouth 2 (two) times daily. (Patient not taking: Reported on 12/17/2020) 10 capsule 0   Facility-Administered Medications Prior to Visit  Medication Dose Route Frequency Provider Last Rate Last Admin   0.9 %  sodium chloride infusion  500 mL Intravenous Continuous 02/16/2021, MD         ROS Review of Systems  Constitutional:  Negative for activity change, appetite change and fatigue.  HENT:  Negative for congestion, sinus pressure and sore throat.   Eyes:  Negative for visual disturbance.  Respiratory:  Negative for cough, chest tightness, shortness of breath and wheezing.   Cardiovascular:  Negative for chest pain and palpitations.  Gastrointestinal:  Negative for abdominal distention, abdominal pain and constipation.  Endocrine: Negative for polydipsia.  Genitourinary:  Negative for dysuria and frequency.  Musculoskeletal:  Negative for arthralgias and back pain.  Skin:  Negative for rash.  Neurological:  Negative for tremors, light-headedness and numbness.  Hematological:  Does not bruise/bleed  easily.  Psychiatric/Behavioral:  Negative for agitation and behavioral problems.    Objective:  BP 122/75   Pulse 74   Ht 5\' 2"  (1.575 m)   Wt 232 lb (105.2 kg)   SpO2 98%   BMI 42.43 kg/m   BP/Weight 12/17/2020 11/21/2020 11/07/2020  Systolic BP 122 143 139  Diastolic BP 75 91 91  Wt. (Lbs) 232 - -  BMI 42.43 - -      Physical Exam Constitutional:      Appearance: She is well-developed.  Cardiovascular:     Rate and Rhythm: Normal rate.     Heart sounds: Normal heart  sounds. No murmur heard. Pulmonary:     Effort: Pulmonary effort is normal.     Breath sounds: Normal breath sounds. No wheezing or rales.  Chest:     Chest wall: No tenderness.  Abdominal:     General: Bowel sounds are normal. There is no distension.     Palpations: Abdomen is soft. There is no mass.     Tenderness: There is no abdominal tenderness.  Musculoskeletal:        General: Normal range of motion.     Right lower leg: No edema.     Left lower leg: No edema.  Neurological:     Mental Status: She is alert and oriented to person, place, and time.  Psychiatric:        Mood and Affect: Mood normal.    CMP Latest Ref Rng & Units 11/07/2020 10/09/2020 11/01/2019  Glucose 70 - 99 mg/dL 11/03/2019) 82 88  BUN 6 - 20 mg/dL 8 13 12   Creatinine 0.44 - 1.00 mg/dL 948(N 4.62  Sodium 135 - 145 mmol/L 137 140 138  Potassium 3.5 - 5.1 mmol/L 3.3(L) 4.2 4.0  Chloride 98 - 111 mmol/L 102 105 101  CO2 22 - 32 mmol/L 26 22 23   Calcium 8.9 - 10.3 mg/dL 9.5 9.3 9.4  Total Protein 6.0 - 8.5 g/dL - 7.4 7.4  Total Bilirubin 0.0 - 1.2 mg/dL - 0.3 0.4  Alkaline Phos 44 - 121 IU/L - 67 63  AST 0 - 40 IU/L - 19 16  ALT 0 - 32 IU/L - 14 8    Lipid Panel     Component Value Date/Time   CHOL 176 10/09/2020 1140   TRIG 61 10/09/2020 1140   HDL 60 10/09/2020 1140   CHOLHDL 2.9 10/09/2020 1140   LDLCALC 104 (H) 10/09/2020 1140    CBC    Component Value Date/Time   WBC 9.0 11/07/2020 0424   RBC 4.23 11/07/2020 0424   HGB 12.3 11/07/2020 0424   HGB 13.6 12/30/2017 1004   HCT 39.0 11/07/2020 0424   HCT 41.3 12/30/2017 1004   PLT 237 11/07/2020 0424   PLT 259 12/30/2017 1004   MCV 92.2 11/07/2020 0424   MCV 89 12/30/2017 1004   MCH 29.1 11/07/2020 0424   MCHC 31.5 11/07/2020 0424   RDW 13.9 11/07/2020 0424   RDW 14.7 12/30/2017 1004   LYMPHSABS 2.9 12/30/2017 1004   EOSABS 0.1 12/30/2017 1004   BASOSABS 0.0 12/30/2017 1004      Assessment & Plan:  1.  Acute cystitis Completed  course of Bactrim Symptoms have resolved Continue to increase hydration  2. Encounters for administrative purpose Form completed for [facility] schools  3. Tuberculosis screening PPD placed today and she will return in 2 days for reading - TB Skin Test    No orders of the defined  types were placed in this encounter.   Follow-up: Return for Medical conditions, keep previously scheduled appointment.       [name], MD, FAAFP. [facility] [place], [place] [phone]   12/17/2020, 11:44 AM

## 2020-12-17 NOTE — Progress Notes (Signed)
Pt needs paperwork filled out for new job.

## 2020-12-23 ENCOUNTER — Encounter: Payer: Self-pay | Admitting: Family Medicine

## 2021-01-22 DIAGNOSIS — Z13220 Encounter for screening for lipoid disorders: Secondary | ICD-10-CM | POA: Diagnosis not present

## 2021-01-22 DIAGNOSIS — Z6841 Body Mass Index (BMI) 40.0 and over, adult: Secondary | ICD-10-CM | POA: Diagnosis not present

## 2021-01-22 DIAGNOSIS — E559 Vitamin D deficiency, unspecified: Secondary | ICD-10-CM | POA: Diagnosis not present

## 2021-01-22 DIAGNOSIS — Z131 Encounter for screening for diabetes mellitus: Secondary | ICD-10-CM | POA: Diagnosis not present

## 2021-01-22 DIAGNOSIS — R635 Abnormal weight gain: Secondary | ICD-10-CM | POA: Diagnosis not present

## 2021-01-22 DIAGNOSIS — I10 Essential (primary) hypertension: Secondary | ICD-10-CM | POA: Diagnosis not present

## 2021-01-22 DIAGNOSIS — R5383 Other fatigue: Secondary | ICD-10-CM | POA: Diagnosis not present

## 2021-02-04 DIAGNOSIS — I10 Essential (primary) hypertension: Secondary | ICD-10-CM | POA: Diagnosis not present

## 2021-04-15 DIAGNOSIS — Z6841 Body Mass Index (BMI) 40.0 and over, adult: Secondary | ICD-10-CM | POA: Diagnosis not present

## 2021-04-15 DIAGNOSIS — I10 Essential (primary) hypertension: Secondary | ICD-10-CM | POA: Diagnosis not present

## 2021-04-15 DIAGNOSIS — Z136 Encounter for screening for cardiovascular disorders: Secondary | ICD-10-CM | POA: Diagnosis not present

## 2021-04-15 DIAGNOSIS — E0590 Thyrotoxicosis, unspecified without thyrotoxic crisis or storm: Secondary | ICD-10-CM | POA: Diagnosis not present

## 2021-04-30 ENCOUNTER — Emergency Department (HOSPITAL_COMMUNITY)
Admission: EM | Admit: 2021-04-30 | Discharge: 2021-05-01 | Disposition: A | Discharge status: Home / Self Care | Payer: Medicaid Other | Attending: Emergency Medicine | Admitting: Emergency Medicine

## 2021-04-30 ENCOUNTER — Encounter (HOSPITAL_COMMUNITY): Payer: Self-pay | Admitting: Emergency Medicine

## 2021-04-30 ENCOUNTER — Other Ambulatory Visit: Payer: Self-pay

## 2021-04-30 ENCOUNTER — Ambulatory Visit
Admission: EM | Admit: 2021-04-30 | Discharge: 2021-04-30 | Disposition: A | Discharge status: Home / Self Care | Payer: Medicaid Other | Attending: Internal Medicine | Admitting: Internal Medicine

## 2021-04-30 ENCOUNTER — Other Ambulatory Visit: Payer: Self-pay | Admitting: Family Medicine

## 2021-04-30 DIAGNOSIS — X501XXA Overexertion from prolonged static or awkward postures, initial encounter: Secondary | ICD-10-CM | POA: Diagnosis not present

## 2021-04-30 DIAGNOSIS — Z79899 Other long term (current) drug therapy: Secondary | ICD-10-CM | POA: Diagnosis not present

## 2021-04-30 DIAGNOSIS — N9489 Other specified conditions associated with female genital organs and menstrual cycle: Secondary | ICD-10-CM | POA: Insufficient documentation

## 2021-04-30 DIAGNOSIS — I10 Essential (primary) hypertension: Secondary | ICD-10-CM | POA: Diagnosis not present

## 2021-04-30 DIAGNOSIS — M549 Dorsalgia, unspecified: Secondary | ICD-10-CM | POA: Insufficient documentation

## 2021-04-30 DIAGNOSIS — M546 Pain in thoracic spine: Secondary | ICD-10-CM

## 2021-04-30 DIAGNOSIS — R109 Unspecified abdominal pain: Secondary | ICD-10-CM | POA: Insufficient documentation

## 2021-04-30 DIAGNOSIS — T148XXA Other injury of unspecified body region, initial encounter: Secondary | ICD-10-CM

## 2021-04-30 DIAGNOSIS — S39012A Strain of muscle, fascia and tendon of lower back, initial encounter: Secondary | ICD-10-CM | POA: Diagnosis not present

## 2021-04-30 LAB — CBC WITH DIFFERENTIAL/PLATELET
Abs Immature Granulocytes: 0.06 10*3/uL (ref 0.00–0.07)
Basophils Absolute: 0 10*3/uL (ref 0.0–0.1)
Basophils Relative: 0 %
Eosinophils Absolute: 0.2 10*3/uL (ref 0.0–0.5)
Eosinophils Relative: 1 %
HCT: 41 % (ref 36.0–46.0)
Hemoglobin: 12.8 g/dL (ref 12.0–15.0)
Immature Granulocytes: 1 %
Lymphocytes Relative: 29 %
Lymphs Abs: 3.1 10*3/uL (ref 0.7–4.0)
MCH: 29.5 pg (ref 26.0–34.0)
MCHC: 31.2 g/dL (ref 30.0–36.0)
MCV: 94.5 fL (ref 80.0–100.0)
Monocytes Absolute: 0.6 10*3/uL (ref 0.1–1.0)
Monocytes Relative: 5 %
Neutro Abs: 6.8 10*3/uL (ref 1.7–7.7)
Neutrophils Relative %: 64 %
Platelets: 245 10*3/uL (ref 150–400)
RBC: 4.34 MIL/uL (ref 3.87–5.11)
RDW: 14.4 % (ref 11.5–15.5)
WBC: 10.7 10*3/uL — ABNORMAL HIGH (ref 4.0–10.5)
nRBC: 0 % (ref 0.0–0.2)

## 2021-04-30 LAB — COMPREHENSIVE METABOLIC PANEL
ALT: 14 U/L (ref 0–44)
AST: 18 U/L (ref 15–41)
Albumin: 3.7 g/dL (ref 3.5–5.0)
Alkaline Phosphatase: 53 U/L (ref 38–126)
Anion gap: 5 (ref 5–15)
BUN: 9 mg/dL (ref 6–20)
CO2: 27 mmol/L (ref 22–32)
Calcium: 8.8 mg/dL — ABNORMAL LOW (ref 8.9–10.3)
Chloride: 104 mmol/L (ref 98–111)
Creatinine, Ser: 0.87 mg/dL (ref 0.44–1.00)
GFR, Estimated: >60 mL/min (ref >60)
Glucose, Bld: 108 mg/dL — ABNORMAL HIGH (ref 70–99)
Potassium: 3.5 mmol/L (ref 3.5–5.1)
Sodium: 136 mmol/L (ref 135–145)
Total Bilirubin: 0.5 mg/dL (ref 0.3–1.2)
Total Protein: 7.4 g/dL (ref 6.5–8.1)

## 2021-04-30 LAB — URINALYSIS, ROUTINE W REFLEX MICROSCOPIC
Bilirubin Urine: NEGATIVE
Glucose, UA: NEGATIVE mg/dL
Hgb urine dipstick: NEGATIVE
Ketones, ur: NEGATIVE mg/dL
Leukocytes,Ua: NEGATIVE
Nitrite: NEGATIVE
Protein, ur: NEGATIVE mg/dL
Specific Gravity, Urine: 1.02 (ref 1.005–1.030)
pH: 6 (ref 5.0–8.0)

## 2021-04-30 LAB — I-STAT BETA HCG BLOOD, ED (MC, WL, AP ONLY): I-stat hCG, quantitative: <5 m[IU]/mL (ref <5)

## 2021-04-30 MED ORDER — CYCLOBENZAPRINE HCL 5 MG PO TABS: 5.0000 mg | ORAL_TABLET | Freq: Two times a day (BID) | ORAL | 0 refills | Status: DC | PRN

## 2021-04-30 NOTE — ED Triage Notes (Signed)
Pt states she was putting on pants Sunday and started having a sharp left side pain. Pain is starting to radiate to front of stomach. Denies N/V.

## 2021-04-30 NOTE — Telephone Encounter (Signed)
Requested Prescriptions  Pending Prescriptions Disp Refills   amLODipine (NORVASC) 10 MG tablet [Pharmacy Med Name: AMLODIPINE BESYLATE 10 MG TAB] 90 tablet 1    Sig: TAKE 1 TABLET BY MOUTH EVERY DAY     Cardiovascular:  Calcium Channel Blockers Passed - 04/30/2021  9:02 AM      Passed - Last BP in normal range    BP Readings from Last 1 Encounters:  12/17/20 122/75         Passed - Valid encounter within last 6 months    Recent Outpatient Visits          4 months ago Encounters for administrative purpose   [facility] [place], Odette Horns, MD   6 months ago Morbid obesity [facility])   [facility] [place], Odette Horns, MD   1 year ago Annual physical exam   [facility] [place], Odette Horns, MD   2 years ago Vaginal discharge   [facility] [name], MD   2 years ago Morbid obesity [facility])   [facility] [name], MD

## 2021-04-30 NOTE — ED Triage Notes (Signed)
Two day h/o intermittent left side back pain that has increased in frequency simnce the onset. Sitting for long periods and sudden moves in the bed aggravate sxs. Has been taking aleve, BC powder and tylenol without relief. No falls or injuries.  Pt does not think sxs are UTI related. No BLE pain or n/t.

## 2021-04-30 NOTE — ED Provider Notes (Signed)
Emergency Medicine Provider Triage Evaluation Note  Krystal Chan , a 35 y.o. female  was evaluated in triage.  Pt complains of left-sided abdominal and flank pain.  Symptoms started after trying to put on her pants on Sunday.  She did had an acute sharp pain through her left side.  Pain persisted since that time, prompting emergency department evaluation today.  Review of records show she saw urgent care and was prescribed a muscle relaxer.  No nausea, vomiting, fever.  No UTI symptoms.  Review of Systems  Positive: Flank pain Negative: Vomiting, irritative UTI symptoms  Physical Exam  BP (!) 149/94 (BP Location: Left Arm)    Pulse 74    Temp 98.9 F (37.2 C) (Oral)    Resp 16    SpO2 100%  Gen:   Awake, no distress   Resp:  Normal effort  MSK:   Moves extremities without difficulty  Other:  Abdomen tender to palpation left lower quadrant and left flank  Medical Decision Making  Medically screening exam initiated at 5:29 PM.  Appropriate orders placed.  Attallah Ontko was informed that the remainder of the evaluation will be completed by another provider, this initial triage assessment does not replace that evaluation, and the importance of remaining in the ED until their evaluation is complete.     Renne Crigler, PA-C 04/30/21 1730    Sloan Leiter, DO 04/30/21 2147

## 2021-04-30 NOTE — ED Provider Notes (Signed)
[facility]    CSN: [id number] Arrival date & time: 04/30/21  1528      History   Chief Complaint Chief Complaint  Patient presents with   Back Pain    Left lumbar    HPI Krystal Chan is a 35 y.o. female.   Patient presents with a 2-day history of intermittent left thoracic back pain.  Denies any apparent injury.  Denies any chronic back pain.  Denies urinary burning, urinary frequency, hematuria, vaginal discharge, irregular vaginal bleeding, urinary or bowel incontinence, saddle anesthesia.  Patient reports that movement exacerbates pain.  She has taken Aleve, BC powder, Tylenol with no improvement in symptoms.  Denies any numbness or tingling.  Pain does not radiate.   Back Pain  Past Medical History:  Diagnosis Date   History of preterm delivery, currently pregnant 02/28/2015   [ ] 17-P   Hypertension    Umbilical hernia    repaired ~2004    Patient Active Problem List   Diagnosis Date Noted   Acute cystitis without hematuria 11/21/2020   Hypertension 06/22/2018   Anemia 12/30/2017    Past Surgical History:  Procedure Laterality Date   CESAREAN SECTION MULTI-GESTATIONAL N/A 07/14/2017   Procedure: CESAREAN SECTION MULTI-GESTATIONAL;  Surgeon: 09/13/2017, DO;  [facility];  Service: Obstetrics;  Laterality: N/A;   HERNIA REPAIR     gastic hernia 2005 and umbilical hernia 2004   LAPAROTOMY N/A 07/17/2017   Procedure: Exploratory Laparottomy, Evacuation of Abdominal Wall Hematoma;  Surgeon: 09/16/2017, MD;  Location: [facility];  Service: Gynecology;  Laterality: N/A;    OB History     Gravida  6   Para  4   Term  2   Preterm  2   AB  2   Living  5      SAB      IAB  2   Ectopic      Multiple  1   Live Births  5            Home Medications    Prior to Admission medications   Medication Sig Start Date End Date Taking? Authorizing Provider  cyclobenzaprine (FLEXERIL) 5 MG tablet Take 1 tablet (5 mg  total) by mouth 2 (two) times daily as needed for muscle spasms. 04/30/21  Yes Mikki Ziff, 05/02/21 E, FNP  amLODipine (NORVASC) 10 MG tablet TAKE 1 TABLET BY MOUTH EVERY DAY 04/30/21   05/02/21, MD  nitrofurantoin, macrocrystal-monohydrate, (MACROBID) 100 MG capsule Take 1 capsule (100 mg total) by mouth 2 (two) times daily. Patient not taking: Reported on 12/17/2020 10/28/20   10/30/20, MD    Family History Family History  Problem Relation Age of Onset   Breast cancer Maternal Grandmother    Hypertension Maternal Grandmother    Cancer Maternal Grandmother        breast   Hypertension Mother    Heart disease Mother    Hypertension Paternal Grandmother    Diabetes Paternal Grandmother    Diabetes Father    Hearing loss Neg Hx    Colon cancer Neg Hx    Stomach cancer Neg Hx    Liver disease Neg Hx    Pancreatic cancer Neg Hx    Esophageal cancer Neg Hx     Social History Social History   Tobacco Use   Smoking status: Never   Smokeless tobacco: Never  Vaping Use   Vaping Use: Never used  Substance Use Topics  Alcohol use: No    Comment: ocassionally   Drug use: No     Allergies   Patient has no known allergies.   Review of Systems Review of Systems Per HPI  Physical Exam Triage Vital Signs ED Triage Vitals  Enc Vitals Group     BP 04/30/21 1540 (!) 148/98     Pulse Rate 04/30/21 1540 69     Resp 04/30/21 1540 18     Temp 04/30/21 1540 98.3 F (36.8 C)     Temp Source 04/30/21 1540 Oral     SpO2 04/30/21 1540 98 %     Weight --      Height --      Head Circumference --      Peak Flow --      Pain Score 04/30/21 1544 8     Pain Loc --      Pain Edu? --      Excl. in [place]? --    No data found.  Updated Vital Signs BP (!) 148/98 (BP Location: Left Arm)    Pulse 69    Temp 98.3 F (36.8 C) (Oral)    Resp 18    SpO2 98%   Visual Acuity Right Eye Distance:   Left Eye Distance:   Bilateral Distance:    Right Eye Near:   Left Eye Near:     Bilateral Near:     Physical Exam Constitutional:      General: She is not in acute distress.    Appearance: Normal appearance. She is not toxic-appearing or diaphoretic.  HENT:     Head: Normocephalic and atraumatic.  Eyes:     Extraocular Movements: Extraocular movements intact.     Conjunctiva/sclera: Conjunctivae normal.  Pulmonary:     Effort: Pulmonary effort is normal.  Musculoskeletal:     Cervical back: Normal.     Thoracic back: Tenderness present. No swelling, edema or bony tenderness.     Lumbar back: Normal.       Back:     Comments: Tenderness to palpation to left thoracic back.  No step-off, crepitus, direct spinal tenderness.  Full range of motion of arms.  Grip strength 5/5.  Neurological:     General: No focal deficit present.     Mental Status: She is alert and oriented to person, place, and time. Mental status is at baseline.     Deep Tendon Reflexes: Reflexes are normal and symmetric.  Psychiatric:        Mood and Affect: Mood normal.        Behavior: Behavior normal.        Thought Content: Thought content normal.        Judgment: Judgment normal.     UC Treatments / Results  Labs (all labs ordered are listed, but only abnormal results are displayed) Labs Reviewed - No data to display  EKG   Radiology No results found.  Procedures Procedures (including critical care time)  Medications Ordered in UC Medications - No data to display  Initial Impression / Assessment and Plan / UC Course  I have reviewed the triage vital signs and the nursing notes.  Pertinent labs & imaging results that were available during my care of the patient were reviewed by me and considered in my medical decision making (see chart for details).     Physical exam is consistent with muscle strain.  Will treat with Flexeril.  Advised patient this can cause drowsiness.  Discussed supportive care.  Patient to alternate ice and heat application.  No red flags on exam.   Low suspicion for urinary tract involvement.  Discussed strict return precautions.  Patient verbalized understanding and was agreeable with plan. Final Clinical Impressions(s) / UC Diagnoses   Final diagnoses:  Acute left-sided thoracic back pain     Discharge Instructions      You have a strain of your back.  You have been prescribed a muscle relaxer to help treat this.  Please be advised that this can cause drowsiness.  Do not drive while taking this medication.  Also alternate ice and heat application to affected area.    ED Prescriptions     Medication Sig Dispense Auth. Provider   cyclobenzaprine (FLEXERIL) 5 MG tablet Take 1 tablet (5 mg total) by mouth 2 (two) times daily as needed for muscle spasms. 15 tablet [place], [place], [place]      I have reviewed the PDMP during this encounter.   Teodora Medici, [place] 04/30/21 [phone]

## 2021-04-30 NOTE — Discharge Instructions (Signed)
You have a strain of your back.  You have been prescribed a muscle relaxer to help treat this.  Please be advised that this can cause drowsiness.  Do not drive while taking this medication.  Also alternate ice and heat application to affected area.

## 2021-05-01 MED ORDER — CYCLOBENZAPRINE HCL 10 MG PO TABS: 10.0000 mg | ORAL_TABLET | Freq: Three times a day (TID) | ORAL | 0 refills | Status: DC | PRN

## 2021-05-01 MED ORDER — KETOROLAC TROMETHAMINE 30 MG/ML IJ SOLN
30.0000 mg | Freq: Once | INTRAMUSCULAR | Status: AC
  Administered 2021-05-01: 01:00:00 30 mg via INTRAMUSCULAR
  Filled 2021-05-01: qty 1

## 2021-05-01 NOTE — ED Notes (Signed)
Patient verbalizes understanding of d/c instructions. Opportunities for questions and answers were provided. Pt d/c from ED and ambulated to lobby.  

## 2021-05-01 NOTE — ED Provider Notes (Signed)
[facility] EMERGENCY DEPARTMENT Provider Note   CSN: [id number] Arrival date & time: 04/30/21  1705     History No chief complaint on file.   Krystal Chan is a 35 y.o. female.  Patient presents to the emergency department with chief complaint of left-sided flank pain.  She states that onset of symptoms was a day or 2 ago while she was putting on her jeans she felt a sharp pinch in her back.  She states that now the pain continues to radiate around her side.  She denies numbness or weakness.  Her symptoms are worsened with movement.  She denies any rash or fever.  Denies any dysuria or hematuria.  The history is provided by the patient. No language interpreter was used.      Past Medical History:  Diagnosis Date   History of preterm delivery, currently pregnant 02/28/2015   [ ] 17-P   Hypertension    Umbilical hernia    repaired ~2004    Patient Active Problem List   Diagnosis Date Noted   Acute cystitis without hematuria 11/21/2020   Hypertension 06/22/2018   Anemia 12/30/2017    Past Surgical History:  Procedure Laterality Date   CESAREAN SECTION MULTI-GESTATIONAL N/A 07/14/2017   Procedure: CESAREAN SECTION MULTI-GESTATIONAL;  Surgeon: Truett Mainland, DO;  Location: [facility];  Service: Obstetrics;  Laterality: N/A;   HERNIA REPAIR     gastic hernia [id number] and umbilical hernia [id number]   LAPAROTOMY N/A 07/17/2017   Procedure: Exploratory Laparottomy, Evacuation of Abdominal Wall Hematoma;  Surgeon: Donnamae Jude, MD;  Location: [facility];  Service: Gynecology;  Laterality: N/A;     OB History     Gravida  6   Para  4   Term  2   Preterm  2   AB  2   Living  5      SAB      IAB  2   Ectopic      Multiple  1   Live Births  5           Family History  Problem Relation Age of Onset   Breast cancer Maternal Grandmother    Hypertension Maternal Grandmother    Cancer Maternal Grandmother        breast   Hypertension Mother     Heart disease Mother    Hypertension Paternal Grandmother    Diabetes Paternal Grandmother    Diabetes Father    Hearing loss Neg Hx    Colon cancer Neg Hx    Stomach cancer Neg Hx    Liver disease Neg Hx    Pancreatic cancer Neg Hx    Esophageal cancer Neg Hx     Social History   Tobacco Use   Smoking status: Never   Smokeless tobacco: Never  Vaping Use   Vaping Use: Never used  Substance Use Topics   Alcohol use: No    Comment: ocassionally   Drug use: No    Home Medications Prior to Admission medications   Medication Sig Start Date End Date Taking? Authorizing Provider  cyclobenzaprine (FLEXERIL) 10 MG tablet Take 1 tablet (10 mg total) by mouth 3 (three) times daily as needed for muscle spasms. 05/01/21  Yes Montine Circle, PA-C  amLODipine (NORVASC) 10 MG tablet TAKE 1 TABLET BY MOUTH EVERY DAY 04/30/21   Charlott Rakes, MD  nitrofurantoin, macrocrystal-monohydrate, (MACROBID) 100 MG capsule Take 1 capsule (100 mg total) by mouth 2 (two) times daily.  Patient not taking: Reported on 12/17/2020 10/28/20   [name], MD    Allergies    Patient has no known allergies.  Review of Systems   Review of Systems  All other systems reviewed and are negative.  Physical Exam Updated Vital Signs BP (!) 152/93 (BP Location: Left Arm)    Pulse 65    Temp 98.9 F (37.2 C) (Oral)    Resp 16    SpO2 100%   Physical Exam Vitals and nursing note reviewed.  Constitutional:      General: She is not in acute distress.    Appearance: She is well-developed.  HENT:     Head: Normocephalic and atraumatic.  Eyes:     Conjunctiva/sclera: Conjunctivae normal.  Cardiovascular:     Rate and Rhythm: Normal rate.     Heart sounds: No murmur heard. Pulmonary:     Effort: Pulmonary effort is normal. No respiratory distress.  Abdominal:     General: There is no distension.  Musculoskeletal:     Cervical back: Neck supple.     Comments: Moves all extremities No CTLS spine  tenderness  Skin:    General: Skin is warm and dry.     Comments: No rash  Neurological:     Mental Status: She is alert and oriented to person, place, and time.  Psychiatric:        Mood and Affect: Mood normal.        Behavior: Behavior normal.    ED Results / Procedures / Treatments   Labs (all labs ordered are listed, but only abnormal results are displayed) Labs Reviewed  CBC WITH DIFFERENTIAL/PLATELET - Abnormal; Notable for the following components:      Result Value   WBC 10.7 (*)    All other components within normal limits  COMPREHENSIVE METABOLIC PANEL - Abnormal; Notable for the following components:   Glucose, Bld 108 (*)    Calcium 8.8 (*)    All other components within normal limits  URINALYSIS, ROUTINE W REFLEX MICROSCOPIC  I-STAT BETA HCG BLOOD, ED ([facility], [facility], [facility] ONLY)    EKG None  Radiology No results found.  Procedures Procedures   Medications Ordered in ED Medications  ketorolac (TORADOL) 30 MG/ML injection 30 mg (has no administration in time range)    ED Course  I have reviewed the triage vital signs and the nursing notes.  Pertinent labs & imaging results that were available during my care of the patient were reviewed by me and considered in my medical decision making (see chart for details).    MDM Rules/Calculators/A&P                         Patient here with left-sided back pain.  Seems musculoskeletal in nature.  Onset was after bending to put on her jeans.  She does not have any bowel or bladder incontinence nor does she have any numbness or tingling.  Her symptoms are reproducible with movement.  We will treat for muscle strain.  I doubt kidney stone.  Doubt kidney infection.  Labs are reassuring.  Pregnancy test negative.    Final Clinical Impression(s) / ED Diagnoses Final diagnoses:  Muscle strain    Rx / DC Orders ED Discharge Orders          Ordered    cyclobenzaprine (FLEXERIL) 10 MG tablet  3 times daily PRN         05/01/21 0007  Montine Circle, PA-C 05/01/21 0017    Deno Etienne, DO 05/01/21 [place]

## 2021-05-02 ENCOUNTER — Telehealth: Payer: Self-pay

## 2021-05-02 NOTE — Telephone Encounter (Signed)
Transition Care Management Unsuccessful Follow-up Telephone Call  Date of discharge and from where:  05/01/2021-[facility]   Attempts:  1st Attempt  Reason for unsuccessful TCM follow-up call:  Left voice message

## 2021-05-03 NOTE — Telephone Encounter (Signed)
Transition Care Management Follow-up Telephone Call Date of discharge and from where: 05/01/2021-[facility]  How have you been since you were released from the hospital? Patient stated she is doing better.  Any questions or concerns? No  Items Reviewed: Did the pt receive and understand the discharge instructions provided? Yes  Medications obtained and verified? Yes  Other? No  Any new allergies since your discharge? No  Dietary orders reviewed? No Do you have support at home? Yes   Home Care and Equipment/Supplies: Were home health services ordered? not applicable If so, what is the name of the agency? N/A  Has the agency set up a time to come to the patient's home? not applicable Were any new equipment or medical supplies ordered?  No What is the name of the medical supply agency? N/A Were you able to get the supplies/equipment? not applicable Do you have any questions related to the use of the equipment or supplies? No  Functional Questionnaire: (I = Independent and D = Dependent) ADLs: I  Bathing/Dressing- I  Meal Prep- I  Eating- I  Maintaining continence- I  Transferring/Ambulation- I  Managing Meds- I  Follow up appointments reviewed:  PCP Hospital f/u appt confirmed? No  confirmed? No   Are transportation arrangements needed? No  If their condition worsens, is the pt aware to call PCP or go to the Emergency Dept.? Yes Was the patient provided with contact information for the PCP's office or ED? Yes Was to pt encouraged to call back with questions or concerns? Yes

## 2021-05-26 ENCOUNTER — Other Ambulatory Visit: Payer: Self-pay

## 2021-05-26 ENCOUNTER — Ambulatory Visit
Admission: EM | Admit: 2021-05-26 | Discharge: 2021-05-26 | Disposition: A | Discharge status: Home / Self Care | Payer: Medicaid Other | Attending: Physician Assistant | Admitting: Physician Assistant

## 2021-05-26 ENCOUNTER — Encounter: Payer: Self-pay | Admitting: Emergency Medicine

## 2021-05-26 DIAGNOSIS — J029 Acute pharyngitis, unspecified: Secondary | ICD-10-CM | POA: Diagnosis not present

## 2021-05-26 LAB — POCT RAPID STREP A (OFFICE): Rapid Strep A Screen: NEGATIVE

## 2021-05-26 MED ORDER — PREDNISONE 20 MG PO TABS: 40.0000 mg | ORAL_TABLET | Freq: Every day | ORAL | 0 refills | Status: AC

## 2021-05-26 NOTE — Discharge Instructions (Addendum)
° °  Try  Cepacol  lozenges for sore throat relief.

## 2021-05-26 NOTE — ED Triage Notes (Signed)
2 weeks ago had nasal congestion, cough, sore throat. Everything else improved except sore throat. Has been managing with OTC meds, mild improvement on those. Reports hurting more on left side. Denies problems swallowing, breathing, managing respirations.

## 2021-05-26 NOTE — ED Provider Notes (Signed)
[facility]    CSN: [id number] Arrival date & time: 05/26/21  0919      History   Chief Complaint Chief Complaint  Patient presents with   Sore Throat    HPI Krystal Chan is a 36 y.o. female.   Patient here today for evaluation of sore throat that has lingered over the last 2 weeks.  She states initially she did have nasal congestion, cough but these have cleared.  She has been using over-the-counter medication with mild relief.  She states the left side of her throat hurts more than the right.  The history is provided by the patient.  Sore Throat Pertinent negatives include no shortness of breath.   Past Medical History:  Diagnosis Date   History of preterm delivery, currently pregnant 02/28/2015   [ ] 17-P   Hypertension    Umbilical hernia    repaired ~2004    Patient Active Problem List   Diagnosis Date Noted   Acute cystitis without hematuria 11/21/2020   Hypertension 06/22/2018   Anemia 12/30/2017    Past Surgical History:  Procedure Laterality Date   CESAREAN SECTION MULTI-GESTATIONAL N/A 07/14/2017   Procedure: CESAREAN SECTION MULTI-GESTATIONAL;  Surgeon: Truett Mainland, DO;  Location: [facility];  Service: Obstetrics;  Laterality: N/A;   HERNIA REPAIR     gastic hernia [id number] and umbilical hernia [id number]   LAPAROTOMY N/A 07/17/2017   Procedure: Exploratory Laparottomy, Evacuation of Abdominal Wall Hematoma;  Surgeon: Donnamae Jude, MD;  Location: [facility];  Service: Gynecology;  Laterality: N/A;    OB History     Gravida  6   Para  4   Term  2   Preterm  2   AB  2   Living  5      SAB      IAB  2   Ectopic      Multiple  1   Live Births  5            Home Medications    Prior to Admission medications   Medication Sig Start Date End Date Taking? Authorizing Provider  predniSONE (DELTASONE) 20 MG tablet Take 2 tablets (40 mg total) by mouth daily with breakfast for 5 days. 05/26/21 05/31/21 Yes Francene Finders,  PA-C  amLODipine (NORVASC) 10 MG tablet TAKE 1 TABLET BY MOUTH EVERY DAY 04/30/21   Charlott Rakes, MD  cyclobenzaprine (FLEXERIL) 10 MG tablet Take 1 tablet (10 mg total) by mouth 3 (three) times daily as needed for muscle spasms. 05/01/21   Montine Circle, PA-C  nitrofurantoin, macrocrystal-monohydrate, (MACROBID) 100 MG capsule Take 1 capsule (100 mg total) by mouth 2 (two) times daily. Patient not taking: Reported on 12/17/2020 10/28/20   Charlott Rakes, MD    Family History Family History  Problem Relation Age of Onset   Breast cancer Maternal Grandmother    Hypertension Maternal Grandmother    Cancer Maternal Grandmother        breast   Hypertension Mother    Heart disease Mother    Hypertension Paternal Grandmother    Diabetes Paternal Grandmother    Diabetes Father    Hearing loss Neg Hx    Colon cancer Neg Hx    Stomach cancer Neg Hx    Liver disease Neg Hx    Pancreatic cancer Neg Hx    Esophageal cancer Neg Hx     Social History Social History   Tobacco Use   Smoking status: Never  Smokeless tobacco: Never  Vaping Use   Vaping Use: Never used  Substance Use Topics   Alcohol use: No    Comment: ocassionally   Drug use: No     Allergies   Patient has no known allergies.   Review of Systems Review of Systems  Constitutional:  Negative for chills and fever.  HENT:  Positive for sore throat. Negative for congestion and rhinorrhea.   Eyes:  Negative for discharge and redness.  Respiratory:  Negative for cough and shortness of breath.   Gastrointestinal:  Negative for nausea and vomiting.    Physical Exam Triage Vital Signs ED Triage Vitals  Enc Vitals Group     BP 05/26/21 1010 (!) 166/97     Pulse Rate 05/26/21 1010 80     Resp 05/26/21 1010 18     Temp 05/26/21 1010 97.9 F (36.6 C)     Temp Source 05/26/21 1010 Oral     SpO2 05/26/21 1010 97 %     Weight --      Height --      Head Circumference --      Peak Flow --      Pain Score  05/26/21 1013 4     Pain Loc --      Pain Edu? --      Excl. in [place]? --    No data found.  Updated Vital Signs BP (!) 166/97 (BP Location: Left Arm)    Pulse 80    Temp 97.9 F (36.6 C) (Oral)    Resp 18    SpO2 97%      Physical Exam Vitals and nursing note reviewed.  Constitutional:      General: She is not in acute distress.    Appearance: Normal appearance. She is not ill-appearing.  HENT:     Head: Normocephalic and atraumatic.     Nose: No congestion or rhinorrhea.     Mouth/Throat:     Mouth: Mucous membranes are moist.     Pharynx: Posterior oropharyngeal erythema present. No oropharyngeal exudate.     Tonsils: 3+ on the right. 3+ on the left.  Eyes:     Conjunctiva/sclera: Conjunctivae normal.  Cardiovascular:     Rate and Rhythm: Normal rate and regular rhythm.     Heart sounds: Normal heart sounds. No murmur heard. Pulmonary:     Effort: Pulmonary effort is normal. No respiratory distress.     Breath sounds: Normal breath sounds. No wheezing, rhonchi or rales.  Skin:    General: Skin is warm and dry.  Neurological:     Mental Status: She is alert.  Psychiatric:        Mood and Affect: Mood normal.        Thought Content: Thought content normal.     UC Treatments / Results  Labs (all labs ordered are listed, but only abnormal results are displayed) Labs Reviewed  CULTURE, GROUP A STREP [facility])  POCT RAPID STREP A (OFFICE)    EKG   Radiology No results found.  Procedures Procedures (including critical care time)  Medications Ordered in UC Medications - No data to display  Initial Impression / Assessment and Plan / UC Course  I have reviewed the triage vital signs and the nursing notes.  Pertinent labs & imaging results that were available during my care of the patient were reviewed by me and considered in my medical decision making (see chart for details).    Will treat with steroid  to hopefully decrease inflammation of tonsils and pharynx.   Recommended over-the-counter symptomatic treatment to also help with symptoms.  Will send strep culture.  Rapid strep negative in office.  Encouraged follow-up if no gradual improvement or symptoms worsen in any way.  Final Clinical Impressions(s) / UC Diagnoses   Final diagnoses:  Acute pharyngitis, unspecified etiology     Discharge Instructions        Try  Cepacol  lozenges for sore throat relief.     ED Prescriptions     Medication Sig Dispense Auth. Provider   predniSONE (DELTASONE) 20 MG tablet Take 2 tablets (40 mg total) by mouth daily with breakfast for 5 days. 10 tablet Francene Finders, PA-C      PDMP not reviewed this encounter.   Francene Finders, PA-C 05/26/21 1119

## 2021-05-28 ENCOUNTER — Telehealth (HOSPITAL_COMMUNITY): Payer: Self-pay | Admitting: Emergency Medicine

## 2021-05-28 LAB — CULTURE, GROUP A STREP (THRC)

## 2021-05-28 MED ORDER — AZITHROMYCIN 250 MG PO TABS: 250.0000 mg | ORAL_TABLET | Freq: Every day | ORAL | 0 refills | Status: DC

## 2021-07-17 ENCOUNTER — Ambulatory Visit
Admission: EM | Admit: 2021-07-17 | Discharge: 2021-07-17 | Disposition: A | Discharge status: Home / Self Care | Payer: Medicaid Other | Attending: Physician Assistant | Admitting: Physician Assistant

## 2021-07-17 ENCOUNTER — Ambulatory Visit (INDEPENDENT_AMBULATORY_CARE_PROVIDER_SITE_OTHER): Payer: Medicaid Other

## 2021-07-17 ENCOUNTER — Other Ambulatory Visit: Payer: Self-pay

## 2021-07-17 DIAGNOSIS — W19XXXA Unspecified fall, initial encounter: Secondary | ICD-10-CM | POA: Diagnosis not present

## 2021-07-17 DIAGNOSIS — M25562 Pain in left knee: Secondary | ICD-10-CM | POA: Diagnosis not present

## 2021-07-17 DIAGNOSIS — Z043 Encounter for examination and observation following other accident: Secondary | ICD-10-CM | POA: Diagnosis not present

## 2021-07-17 DIAGNOSIS — S8992XA Unspecified injury of left lower leg, initial encounter: Secondary | ICD-10-CM

## 2021-07-17 MED ORDER — PREDNISONE 20 MG PO TABS: 40.0000 mg | ORAL_TABLET | Freq: Every day | ORAL | 0 refills | Status: AC

## 2021-07-17 NOTE — ED Triage Notes (Signed)
Pt reports [place] her left [place]. She c/o knee pain and knot on the back of her leg. ?

## 2021-07-17 NOTE — ED Provider Notes (Signed)
?[facility] ? ? ? ?CSN: [id number] ?Arrival date & time: 07/17/21  1503 ? ? ?  ? ?History   ?Chief Complaint ?Chief Complaint  ?Patient presents with  ? Knee Injury  ? ? ?HPI ?Krystal Chan is a 37 y.o. female.  ? ?Patient here today for evaluation of left knee pain that started after she fell onto her left knee at a trampoline park 5 days ago. She reports that she continues to have pain diffusely to anterior knee but has also developed a few "knots" to her posterior knee and has pain in her posterior left lower leg. She notes extending knee worsens pain.  ? ?The history is provided by the patient.  ? ?Past Medical History:  ?Diagnosis Date  ? History of preterm delivery, currently pregnant 02/28/2015  ? [ ] 17-P  ? Hypertension   ? Umbilical hernia   ? repaired ~2004  ? ? ?Patient Active Problem List  ? Diagnosis Date Noted  ? Acute cystitis without hematuria 11/21/2020  ? Hypertension 06/22/2018  ? Anemia 12/30/2017  ? ? ?Past Surgical History:  ?Procedure Laterality Date  ? CESAREAN SECTION MULTI-GESTATIONAL N/A 07/14/2017  ? Procedure: CESAREAN SECTION MULTI-GESTATIONAL;  Surgeon: 09/13/2017, DO;  Location: [facility];  Service: Obstetrics;  Laterality: N/A;  ? HERNIA REPAIR    ? gastic hernia 2005 and umbilical hernia 2004  ? LAPAROTOMY N/A 07/17/2017  ? Procedure: Exploratory Laparottomy, Evacuation of Abdominal Wall Hematoma;  Surgeon: 09/16/2017, MD;  Location: [facility];  Service: Gynecology;  Laterality: N/A;  ? ? ?OB History   ? ? Gravida  ?6  ? Para  ?4  ? Term  ?2  ? Preterm  ?2  ? AB  ?2  ? Living  ?5  ?  ? ? SAB  ?   ? IAB  ?2  ? Ectopic  ?   ? Multiple  ?1  ? Live Births  ?5  ?   ?  ?  ? ? ? ?Home Medications   ? ?Prior to Admission medications   ?Medication Sig Start Date End Date Taking? Authorizing Provider  ?predniSONE (DELTASONE) 20 MG tablet Take 2 tablets (40 mg total) by mouth daily with breakfast for 5 days. 07/17/21 07/22/21 Yes 07/24/21, PA-C  ?amLODipine (NORVASC)  10 MG tablet TAKE 1 TABLET BY MOUTH EVERY DAY 04/30/21   05/02/21, MD  ?azithromycin (ZITHROMAX) 250 MG tablet Take 1 tablet (250 mg total) by mouth daily. Take first 2 tablets together, then 1 every day until finished. 05/28/21   Lamptey, 05/30/21, MD  ?cyclobenzaprine (FLEXERIL) 10 MG tablet Take 1 tablet (10 mg total) by mouth 3 (three) times daily as needed for muscle spasms. 05/01/21   05/03/21, PA-C  ?nitrofurantoin, macrocrystal-monohydrate, (MACROBID) 100 MG capsule Take 1 capsule (100 mg total) by mouth 2 (two) times daily. ?Patient not taking: Reported on 12/17/2020 10/28/20   10/30/20, MD  ? ? ?Family History ?Family History  ?Problem Relation Age of Onset  ? Breast cancer Maternal Grandmother   ? Hypertension Maternal Grandmother   ? Cancer Maternal Grandmother   ?     breast  ? Hypertension Mother   ? Heart disease Mother   ? Hypertension Paternal Grandmother   ? Diabetes Paternal Grandmother   ? Diabetes Father   ? Hearing loss Neg Hx   ? Colon cancer Neg Hx   ? Stomach cancer Neg Hx   ? Liver disease Neg Hx   ?  Pancreatic cancer Neg Hx   ? Esophageal cancer Neg Hx   ? ? ?Social History ?Social History  ? ?Tobacco Use  ? Smoking status: Never  ? Smokeless tobacco: Never  ?Vaping Use  ? Vaping Use: Never used  ?Substance Use Topics  ? Alcohol use: No  ?  Comment: ocassionally  ? Drug use: No  ? ? ? ?Allergies   ?Patient has no known allergies. ? ? ?Review of Systems ?Review of Systems  ?Constitutional:  Negative for chills and fever.  ?Eyes:  Negative for discharge and redness.  ?Respiratory:  Negative for shortness of breath.   ?Gastrointestinal:  Negative for abdominal pain, nausea and vomiting.  ?Musculoskeletal:  Positive for arthralgias, joint swelling and myalgias.  ? ? ?Physical Exam ?Triage Vital Signs ?ED Triage Vitals [07/17/21 1518]  ?Enc Vitals Group  ?   BP (!) 145/98  ?   Pulse Rate 77  ?   Resp 16  ?   Temp 98.3 ?F (36.8 ?C)  ?   Temp Source Oral  ?   SpO2 100 %  ?    Weight   ?   Height   ?   Head Circumference   ?   Peak Flow   ?   Pain Score   ?   Pain Loc   ?   Pain Edu?   ?   Excl. in [place]?   ? ?No data found. ? ?Updated Vital Signs ?BP (!) 149/89   Pulse 77   Temp 98.3 ?F (36.8 ?C) (Oral)   Resp 16   SpO2 100%  ?   ? ?Physical Exam ?Vitals and nursing note reviewed.  ?Constitutional:   ?   General: She is not in acute distress. ?   Appearance: Normal appearance. She is not ill-appearing.  ?HENT:  ?   Head: Normocephalic and atraumatic.  ?Eyes:  ?   Conjunctiva/sclera: Conjunctivae normal.  ?Cardiovascular:  ?   Rate and Rhythm: Normal rate.  ?Pulmonary:  ?   Effort: Pulmonary effort is normal.  ?Musculoskeletal:  ?   Comments: Full ROM of left knee with pain noted with full extension, no TTP to anterior knee, mild TTP to left calf with cords palpated to posterior left knee  ?Neurological:  ?   Mental Status: She is alert.  ?Psychiatric:     ?   Mood and Affect: Mood normal.     ?   Behavior: Behavior normal.     ?   Thought Content: Thought content normal.  ? ? ? ?UC Treatments / Results  ?Labs ?(all labs ordered are listed, but only abnormal results are displayed) ?Labs Reviewed - No data to display ? ?EKG ? ? ?Radiology ?DG Knee Complete 4 Views Left ? ?Result Date: 07/17/2021 ?CLINICAL DATA:  Fall onto left knee EXAM: LEFT KNEE - COMPLETE 4+ VIEW COMPARISON:  None. FINDINGS: No acute fracture or dislocation. The joint spaces are preserved. Small superior patellar enthesophyte. Soft tissues are unremarkable. IMPRESSION: Negative. Electronically Signed   By: Wiliam Ke M.D.   On: 07/17/2021 15:34   ? ?Procedures ?Procedures (including critical care time) ? ?Medications Ordered in UC ?Medications - No data to display ? ?Initial Impression / Assessment and Plan / UC Course  ?I have reviewed the triage vital signs and the nursing notes. ? ?Pertinent labs & imaging results that were available during my care of the patient were reviewed by me and considered in my medical  decision making (see chart for  details). ? ? Xray without fracture. Will treat with steroid burst to help improve inflammation but recommended follow up with ortho if symptoms do not improve. Ultrasound ordered to rule out DVT given calf tenderness. Will await results for further recommendation.  ? ? ?Final Clinical Impressions(s) / UC Diagnoses  ? ?Final diagnoses:  ?Injury of left knee, initial encounter  ? ?Discharge Instructions   ?None ?  ? ?ED Prescriptions   ? ? Medication Sig Dispense Auth. Provider  ? predniSONE (DELTASONE) 20 MG tablet Take 2 tablets (40 mg total) by mouth daily with breakfast for 5 days. 10 tablet Tomi BambergerMyers, Kolbey Teichert F, PA-C  ? ?  ? ?PDMP not reviewed this encounter. ?  ?Tomi BambergerMyers, Gustavo Meditz F, PA-C ?07/17/21 1609 ? ?

## 2021-07-18 ENCOUNTER — Ambulatory Visit (HOSPITAL_COMMUNITY)
Admission: RE | Admit: 2021-07-18 | Discharge: 2021-07-18 | Disposition: A | Discharge status: Home / Self Care | Payer: Medicaid Other | Source: Ambulatory Visit | Attending: Physician Assistant | Admitting: Physician Assistant

## 2021-07-18 ENCOUNTER — Other Ambulatory Visit (HOSPITAL_COMMUNITY): Payer: Self-pay | Admitting: Physician Assistant

## 2021-07-18 DIAGNOSIS — R52 Pain, unspecified: Secondary | ICD-10-CM | POA: Diagnosis not present

## 2021-07-18 NOTE — Progress Notes (Signed)
Lower extremity venous has been completed.  ? ?Preliminary results in CV Proc.  ? ?Krystal Chan ?07/18/2021 9:19 AM    ?

## 2021-08-12 DIAGNOSIS — I10 Essential (primary) hypertension: Secondary | ICD-10-CM | POA: Diagnosis not present

## 2021-08-12 DIAGNOSIS — S8992XA Unspecified injury of left lower leg, initial encounter: Secondary | ICD-10-CM | POA: Diagnosis not present

## 2021-08-12 DIAGNOSIS — M25462 Effusion, left knee: Secondary | ICD-10-CM | POA: Diagnosis not present

## 2021-08-12 DIAGNOSIS — M25562 Pain in left knee: Secondary | ICD-10-CM | POA: Diagnosis not present

## 2021-08-26 DIAGNOSIS — Z113 Encounter for screening for infections with a predominantly sexual mode of transmission: Secondary | ICD-10-CM | POA: Diagnosis not present

## 2021-08-26 DIAGNOSIS — Z124 Encounter for screening for malignant neoplasm of cervix: Secondary | ICD-10-CM | POA: Diagnosis not present

## 2021-08-26 DIAGNOSIS — Z6841 Body Mass Index (BMI) 40.0 and over, adult: Secondary | ICD-10-CM | POA: Diagnosis not present

## 2021-08-26 DIAGNOSIS — Z0000 Encounter for general adult medical examination without abnormal findings: Secondary | ICD-10-CM | POA: Diagnosis not present

## 2021-09-04 DIAGNOSIS — M25562 Pain in left knee: Secondary | ICD-10-CM | POA: Diagnosis not present

## 2021-09-04 DIAGNOSIS — M94262 Chondromalacia, left knee: Secondary | ICD-10-CM | POA: Diagnosis not present

## 2021-09-23 DIAGNOSIS — M25562 Pain in left knee: Secondary | ICD-10-CM | POA: Diagnosis not present

## 2021-09-23 DIAGNOSIS — M6281 Muscle weakness (generalized): Secondary | ICD-10-CM | POA: Diagnosis not present

## 2021-09-23 DIAGNOSIS — R2689 Other abnormalities of gait and mobility: Secondary | ICD-10-CM | POA: Diagnosis not present

## 2021-09-23 DIAGNOSIS — R6889 Other general symptoms and signs: Secondary | ICD-10-CM | POA: Diagnosis not present

## 2021-09-30 DIAGNOSIS — E0590 Thyrotoxicosis, unspecified without thyrotoxic crisis or storm: Secondary | ICD-10-CM | POA: Diagnosis not present

## 2021-09-30 DIAGNOSIS — I10 Essential (primary) hypertension: Secondary | ICD-10-CM | POA: Diagnosis not present

## 2021-10-14 DIAGNOSIS — M94262 Chondromalacia, left knee: Secondary | ICD-10-CM | POA: Diagnosis not present

## 2021-10-14 DIAGNOSIS — R300 Dysuria: Secondary | ICD-10-CM | POA: Diagnosis not present

## 2021-10-14 DIAGNOSIS — M222X2 Patellofemoral disorders, left knee: Secondary | ICD-10-CM | POA: Diagnosis not present

## 2021-10-14 DIAGNOSIS — M25562 Pain in left knee: Secondary | ICD-10-CM | POA: Diagnosis not present

## 2021-10-14 DIAGNOSIS — R3915 Urgency of urination: Secondary | ICD-10-CM | POA: Diagnosis not present

## 2021-12-03 DIAGNOSIS — R200 Anesthesia of skin: Secondary | ICD-10-CM | POA: Diagnosis not present

## 2022-01-18 ENCOUNTER — Other Ambulatory Visit: Payer: Self-pay

## 2022-01-18 ENCOUNTER — Emergency Department (HOSPITAL_COMMUNITY)
Admission: EM | Admit: 2022-01-18 | Discharge: 2022-01-18 | Discharge status: Against Medical Advice | Payer: Medicaid Other

## 2022-01-18 NOTE — ED Notes (Signed)
Pt stated that she was leaving  

## 2022-02-02 DIAGNOSIS — Z0184 Encounter for antibody response examination: Secondary | ICD-10-CM | POA: Diagnosis not present

## 2022-02-02 DIAGNOSIS — Z111 Encounter for screening for respiratory tuberculosis: Secondary | ICD-10-CM | POA: Diagnosis not present

## 2022-02-04 DIAGNOSIS — G5603 Carpal tunnel syndrome, bilateral upper limbs: Secondary | ICD-10-CM | POA: Diagnosis not present

## 2022-02-24 ENCOUNTER — Ambulatory Visit
Admission: EM | Admit: 2022-02-24 | Discharge: 2022-02-24 | Disposition: A | Discharge status: Home / Self Care | Payer: Medicaid Other | Attending: Physician Assistant | Admitting: Physician Assistant

## 2022-02-24 DIAGNOSIS — M25511 Pain in right shoulder: Secondary | ICD-10-CM

## 2022-02-24 DIAGNOSIS — M778 Other enthesopathies, not elsewhere classified: Secondary | ICD-10-CM

## 2022-02-24 MED ORDER — IBUPROFEN 800 MG PO TABS: 800.0000 mg | ORAL_TABLET | Freq: Three times a day (TID) | ORAL | 0 refills | Status: DC

## 2022-02-24 MED ORDER — PREDNISONE 10 MG PO TABS: 10.0000 mg | ORAL_TABLET | Freq: Three times a day (TID) | ORAL | 0 refills | Status: DC

## 2022-02-24 NOTE — ED Triage Notes (Signed)
Pt presents to uc with co of right shouldered pain for 2 months pt reports pain started in when she was stretching she is not having difficulty putting on bra, washing her self in the shower. Pain is 7/10. Pt has been taking tylenol and aleeve for pain.

## 2022-02-24 NOTE — Discharge Instructions (Addendum)
Advised to use alternating ice and heat therapy, 10 minutes on 20 minutes off, 3-4 times throughout the evening to help reduce pain and inflammation. Advised take ibuprofen 800 mg every 8 hours with food to help reduce inflammation and pain. Advised take prednisone 10 mg 3 times a day for 5 days only to help reduce the acute inflammatory process. Advised to follow-up with PCP or return to urgent care if symptoms fail to improve.  (If symptoms fail to improve over the next 7 to 10 days then advised to consider orthopedic evaluation)

## 2022-02-24 NOTE — ED Provider Notes (Signed)
[facility]    CSN: [id number] Arrival date & time: 02/24/22  1113      History   Chief Complaint Chief Complaint  Patient presents with   Shoulder Injury    HPI Krystal Chan is a 36 y.o. female.   36 year old presents with right shoulder pain.  She indicates for the past 2 months she has been having progressive right shoulder pain and tenderness.  Patient indicates that she has not traumatized her right shoulder or injured it.  She does relate that over the past 2 months her pain is gotten more progressive to where she is having pain at the shoulder joint area, pain when she raises her arm overhead, and when she reaches back to try to undo her bra.  She also indicates she has having some mild numbness and tingling of the hand although she is this may be due to her recently diagnosed carpal tunnel.  She does indicate that she has been taking Tylenol and Aleve OTC and this has not reduced her pain or discomfort.  She does work at a job to where she does repetitive motion and lifts fairly heavy objects throughout the day.   Shoulder Injury    Past Medical History:  Diagnosis Date   History of preterm delivery, currently pregnant 02/28/2015   [ ] 17-P   Hypertension    Umbilical hernia    repaired ~2004    Patient Active Problem List   Diagnosis Date Noted   Acute cystitis without hematuria 11/21/2020   Hypertension 06/22/2018   Anemia 12/30/2017    Past Surgical History:  Procedure Laterality Date   CESAREAN SECTION MULTI-GESTATIONAL N/A 07/14/2017   Procedure: CESAREAN SECTION MULTI-GESTATIONAL;  Surgeon: 09/13/2017, DO;  Location: [facility];  Service: Obstetrics;  Laterality: N/A;   HERNIA REPAIR     gastic hernia 2005 and umbilical hernia 2004   LAPAROTOMY N/A 07/17/2017   Procedure: Exploratory Laparottomy, Evacuation of Abdominal Wall Hematoma;  Surgeon: 09/16/2017, MD;  Location: [facility];  Service: Gynecology;  Laterality: N/A;    OB  History     Gravida  6   Para  4   Term  2   Preterm  2   AB  2   Living  5      SAB      IAB  2   Ectopic      Multiple  1   Live Births  5            Home Medications    Prior to Admission medications   Medication Sig Start Date End Date Taking? Authorizing Provider  ibuprofen (ADVIL) 800 MG tablet Take 1 tablet (800 mg total) by mouth 3 (three) times daily. 02/24/22  Yes 02/26/22, PA-C  predniSONE (DELTASONE) 10 MG tablet Take 1 tablet (10 mg total) by mouth in the morning, at noon, and at bedtime. 02/24/22  Yes 02/26/22, PA-C  amLODipine (NORVASC) 10 MG tablet TAKE 1 TABLET BY MOUTH EVERY DAY 04/30/21   05/02/21, MD  azithromycin (ZITHROMAX) 250 MG tablet Take 1 tablet (250 mg total) by mouth daily. Take first 2 tablets together, then 1 every day until finished. 05/28/21   Lamptey, 05/30/21, MD  cyclobenzaprine (FLEXERIL) 10 MG tablet Take 1 tablet (10 mg total) by mouth 3 (three) times daily as needed for muscle spasms. 05/01/21   05/03/21, PA-C  nitrofurantoin, macrocrystal-monohydrate, (MACROBID) 100 MG capsule Take 1 capsule (100 mg total)  by mouth 2 (two) times daily. Patient not taking: Reported on 12/17/2020 10/28/20   [name], MD    Family History Family History  Problem Relation Age of Onset   Breast cancer Maternal Grandmother    Hypertension Maternal Grandmother    Cancer Maternal Grandmother        breast   Hypertension Mother    Heart disease Mother    Hypertension Paternal Grandmother    Diabetes Paternal Grandmother    Diabetes Father    Hearing loss Neg Hx    Colon cancer Neg Hx    Stomach cancer Neg Hx    Liver disease Neg Hx    Pancreatic cancer Neg Hx    Esophageal cancer Neg Hx     Social History Social History   Tobacco Use   Smoking status: Never   Smokeless tobacco: Never  Vaping Use   Vaping Use: Never used  Substance Use Topics   Alcohol use: No    Comment: ocassionally   Drug use: No      Allergies   Patient has no known allergies.   Review of Systems Review of Systems  Musculoskeletal:  Positive for joint swelling (right shoulder pain).     Physical Exam Triage Vital Signs ED Triage Vitals  Enc Vitals Group     BP 02/24/22 1145 (!) 142/73     Pulse Rate 02/24/22 1145 74     Resp 02/24/22 1145 15     Temp 02/24/22 1145 97.9 F (36.6 C)     Temp src --      SpO2 02/24/22 1145 98 %     Weight --      Height --      Head Circumference --      Peak Flow --      Pain Score 02/24/22 1144 7     Pain Loc --      Pain Edu? --      Excl. in GC? --    No data found.  Updated Vital Signs BP (!) 142/73   Pulse 74   Temp 97.9 F (36.6 C)   Resp 15   LMP 01/25/2022   SpO2 98%   Visual Acuity Right Eye Distance:   Left Eye Distance:   Bilateral Distance:    Right Eye Near:   Left Eye Near:    Bilateral Near:     Physical Exam Constitutional:      Appearance: Normal appearance.  Cardiovascular:     Rate and Rhythm: Normal rate and regular rhythm.     Heart sounds: Normal heart sounds.  Pulmonary:     Effort: Pulmonary effort is normal.     Breath sounds: Normal breath sounds and air entry. No wheezing, rhonchi or rales.  Musculoskeletal:       Arms:     Comments: Right shoulder: Pain is palpated along the anterior and posterior shoulder joint line area.  Pain is increased with overhead motion, lifting greater than 90 degrees, and with resistance abduction.  There is some mild pain with the liftoff test.  The empty can test is negative.  Range of motion is normal, stability is intact, strength is normal.  Neurological:     Mental Status: She is alert.      UC Treatments / Results  Labs (all labs ordered are listed, but only abnormal results are displayed) Labs Reviewed - No data to display  EKG   Radiology No results found.  Procedures Procedures (including critical  care time)  Medications Ordered in UC Medications - No data to  display  Initial Impression / Assessment and Plan / UC Course  I have reviewed the triage vital signs and the nursing notes.  Pertinent labs & imaging results that were available during my care of the patient were reviewed by me and considered in my medical decision making (see chart for details).    Plan: 1.  The acute pain of the right shoulder will be treated with the following: A.  Ibuprofen 800 mg every 8 hours with food to help reduce pain and swelling. B.  Advised using alternating ice and heat therapy, 10 minutes on 20 minutes off, 3-4 times throughout the day to help reduce pain and swelling. 2.  The acute shoulder capsulitis will be treated with the following: A.  Prednisone 10 mg, 1 3 times a day for 5 days only to help treat the inflammatory process. 3.  Patient advised to follow-up with PCP or return to urgent care if symptoms fail to improve.  (Consider orthopedic consult if symptoms fail to improve in the next 7 to 10 days) Final Clinical Impressions(s) / UC Diagnoses   Final diagnoses:  Acute pain of right shoulder  Shoulder capsulitis, right     Discharge Instructions      Advised to use alternating ice and heat therapy, 10 minutes on 20 minutes off, 3-4 times throughout the evening to help reduce pain and inflammation. Advised take ibuprofen 800 mg every 8 hours with food to help reduce inflammation and pain. Advised take prednisone 10 mg 3 times a day for 5 days only to help reduce the acute inflammatory process. Advised to follow-up with PCP or return to urgent care if symptoms fail to improve.  (If symptoms fail to improve over the next 7 to 10 days then advised to consider orthopedic evaluation)    ED Prescriptions     Medication Sig Dispense Auth. Provider   ibuprofen (ADVIL) 800 MG tablet Take 1 tablet (800 mg total) by mouth 3 (three) times daily. 21 tablet Nyoka Lint, PA-C   predniSONE (DELTASONE) 10 MG tablet Take 1 tablet (10 mg total) by mouth in  the morning, at noon, and at bedtime. 15 tablet Nyoka Lint, PA-C      PDMP not reviewed this encounter.   Nyoka Lint, PA-C 02/24/22 1239

## 2022-02-25 ENCOUNTER — Emergency Department (HOSPITAL_COMMUNITY): Payer: Medicaid Other

## 2022-02-25 ENCOUNTER — Encounter (HOSPITAL_COMMUNITY): Payer: Self-pay

## 2022-02-25 ENCOUNTER — Emergency Department (HOSPITAL_COMMUNITY)
Admission: EM | Admit: 2022-02-25 | Discharge: 2022-02-25 | Disposition: A | Discharge status: Home / Self Care | Payer: Medicaid Other | Attending: Emergency Medicine | Admitting: Emergency Medicine

## 2022-02-25 DIAGNOSIS — S199XXA Unspecified injury of neck, initial encounter: Secondary | ICD-10-CM | POA: Diagnosis not present

## 2022-02-25 DIAGNOSIS — M542 Cervicalgia: Secondary | ICD-10-CM | POA: Diagnosis not present

## 2022-02-25 DIAGNOSIS — Z041 Encounter for examination and observation following transport accident: Secondary | ICD-10-CM | POA: Diagnosis not present

## 2022-02-25 DIAGNOSIS — M25552 Pain in left hip: Secondary | ICD-10-CM | POA: Diagnosis not present

## 2022-02-25 DIAGNOSIS — R519 Headache, unspecified: Secondary | ICD-10-CM | POA: Diagnosis not present

## 2022-02-25 DIAGNOSIS — M25512 Pain in left shoulder: Secondary | ICD-10-CM | POA: Insufficient documentation

## 2022-02-25 DIAGNOSIS — S0990XA Unspecified injury of head, initial encounter: Secondary | ICD-10-CM | POA: Diagnosis not present

## 2022-02-25 MED ORDER — HYDROCODONE-ACETAMINOPHEN 5-325 MG PO TABS: 1.0000 | ORAL_TABLET | Freq: Four times a day (QID) | ORAL | 0 refills | Status: DC | PRN

## 2022-02-25 MED ORDER — MORPHINE SULFATE (PF) 4 MG/ML IV SOLN
4.0000 mg | Freq: Once | INTRAVENOUS | Status: AC
  Administered 2022-02-25: 4 mg via INTRAMUSCULAR
  Filled 2022-02-25: qty 1
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
## 2022-02-25 NOTE — ED Notes (Signed)
Portable Xray at bedside.

## 2022-02-25 NOTE — Discharge Instructions (Signed)
Return for any problem.  ?

## 2022-02-25 NOTE — ED Notes (Signed)
Pt transported to CT via stretcher.  

## 2022-02-25 NOTE — ED Notes (Signed)
AVS with prescriptions provided to and discussed with patient and family member at bedside. Pt verbalizes understanding of discharge instructions and denies any questions or concerns at this time. Pt has ride home. Pt ambulated out of department independently with steady gait.  
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
## 2022-03-18 DIAGNOSIS — E010 Iodine-deficiency related diffuse (endemic) goiter: Secondary | ICD-10-CM | POA: Diagnosis not present

## 2022-03-18 DIAGNOSIS — M25511 Pain in right shoulder: Secondary | ICD-10-CM | POA: Diagnosis not present

## 2022-03-18 DIAGNOSIS — R200 Anesthesia of skin: Secondary | ICD-10-CM | POA: Diagnosis not present

## 2022-03-18 DIAGNOSIS — I10 Essential (primary) hypertension: Secondary | ICD-10-CM | POA: Diagnosis not present

## 2022-03-18 DIAGNOSIS — E0590 Thyrotoxicosis, unspecified without thyrotoxic crisis or storm: Secondary | ICD-10-CM | POA: Diagnosis not present

## 2022-03-19 DIAGNOSIS — I10 Essential (primary) hypertension: Secondary | ICD-10-CM | POA: Diagnosis not present

## 2022-03-19 DIAGNOSIS — R200 Anesthesia of skin: Secondary | ICD-10-CM | POA: Diagnosis not present

## 2022-03-19 DIAGNOSIS — M25511 Pain in right shoulder: Secondary | ICD-10-CM | POA: Diagnosis not present

## 2022-03-19 DIAGNOSIS — E0590 Thyrotoxicosis, unspecified without thyrotoxic crisis or storm: Secondary | ICD-10-CM | POA: Diagnosis not present

## 2022-03-25 DIAGNOSIS — E042 Nontoxic multinodular goiter: Secondary | ICD-10-CM | POA: Diagnosis not present

## 2022-03-25 DIAGNOSIS — E0590 Thyrotoxicosis, unspecified without thyrotoxic crisis or storm: Secondary | ICD-10-CM | POA: Diagnosis not present

## 2022-03-29 DIAGNOSIS — J029 Acute pharyngitis, unspecified: Secondary | ICD-10-CM | POA: Diagnosis not present

## 2022-04-15 DIAGNOSIS — G5603 Carpal tunnel syndrome, bilateral upper limbs: Secondary | ICD-10-CM | POA: Diagnosis not present

## 2022-11-09 ENCOUNTER — Ambulatory Visit
Admission: EM | Admit: 2022-11-09 | Discharge: 2022-11-09 | Disposition: A | Discharge status: Home / Self Care | Payer: Medicaid Other | Attending: Family Medicine | Admitting: Family Medicine

## 2022-11-09 DIAGNOSIS — N3090 Cystitis, unspecified without hematuria: Secondary | ICD-10-CM

## 2022-11-09 LAB — POCT URINALYSIS DIP (MANUAL ENTRY)
Bilirubin, UA: NEGATIVE
Glucose, UA: NEGATIVE mg/dL
Ketones, POC UA: NEGATIVE mg/dL
Nitrite, UA: POSITIVE — AB
Protein Ur, POC: 30 mg/dL — AB
Spec Grav, UA: >=1.03 — AB (ref 1.010–1.025)
Urobilinogen, UA: 1 E.U./dL
pH, UA: 6 (ref 5.0–8.0)

## 2022-11-09 MED ORDER — CIPROFLOXACIN HCL 500 MG PO TABS: 500.0000 mg | ORAL_TABLET | Freq: Two times a day (BID) | ORAL | 0 refills | Status: AC

## 2022-11-09 MED ORDER — PHENAZOPYRIDINE HCL 100 MG PO TABS: 100.0000 mg | ORAL_TABLET | Freq: Three times a day (TID) | ORAL | 0 refills | Status: AC | PRN

## 2022-11-09 NOTE — Discharge Instructions (Signed)
The urine had some nitrites and white blood cells.  This can be a sign of a bladder infection.  Take Cipro 500 mg--1 tablet 2 times daily for 7 days  Take Pyridium/phenazopyridine 100 mg--1 tablet 3 times daily as needed for urinary pain.  This medication usually makes the urine orange  Drink plenty of water  Urine culture is sent in, and if it looks like the antibiotic needs to be changed, staff will call you.

## 2022-11-09 NOTE — ED Triage Notes (Signed)
Pt reports she has burning with urination, frequent urination, light blood in urine, low back pain x 1 week.   Took 3 different azos and cranberry juice.

## 2022-11-09 NOTE — ED Provider Notes (Signed)
[facility]    CSN: [id number] Arrival date & time: 11/09/22  1627      History   Chief Complaint No chief complaint on file.   HPI Krystal Chan is a 37 y.o. female.   HPI Here for dysuria and urinary frequency and incomplete bladder emptying.  Symptoms began sometime about 5 to 6 days ago.  She was trying over-the-counter relief, but the dysuria has worsened and now she is starting to see a little blood when she wipes.  She has maybe had some chills but no fever.  She has had some low back pain.  No nausea or vomiting.  She is not allergic to any medications  Last menstrual cycle was June 5    Past Medical History:  Diagnosis Date   History of preterm delivery, currently pregnant 02/28/2015   [ ] 17-P   Hypertension    Umbilical hernia    repaired ~2004    Patient Active Problem List   Diagnosis Date Noted   Acute cystitis without hematuria 11/21/2020   Hypertension 06/22/2018   Anemia 12/30/2017    Past Surgical History:  Procedure Laterality Date   CESAREAN SECTION MULTI-GESTATIONAL N/A 07/14/2017   Procedure: CESAREAN SECTION MULTI-GESTATIONAL;  Surgeon: Levie Heritage, DO;  Location: [facility];  Service: Obstetrics;  Laterality: N/A;   HERNIA REPAIR     gastic hernia 2005 and umbilical hernia 2004   LAPAROTOMY N/A 07/17/2017   Procedure: Exploratory Laparottomy, Evacuation of Abdominal Wall Hematoma;  Surgeon: Reva Bores, MD;  Location: [facility];  Service: Gynecology;  Laterality: N/A;    OB History     Gravida  6   Para  4   Term  2   Preterm  2   AB  2   Living  5      SAB      IAB  2   Ectopic      Multiple  1   Live Births  5            Home Medications    Prior to Admission medications   Medication Sig Start Date End Date Taking? Authorizing Provider  ciprofloxacin (CIPRO) 500 MG tablet Take 1 tablet (500 mg total) by mouth 2 (two) times daily for 7 days. 11/09/22 11/16/22 Yes Lavonna Lampron, Janace Aris, MD   phenazopyridine (PYRIDIUM) 100 MG tablet Take 1 tablet (100 mg total) by mouth 3 (three) times daily as needed (urinary pain). 11/09/22  Yes Zenia Resides, MD  amLODipine (NORVASC) 10 MG tablet TAKE 1 TABLET BY MOUTH EVERY DAY Patient taking differently: Take 10 mg by mouth daily. 04/30/21   [name], MD  meloxicam (MOBIC) 15 MG tablet Take 15 mg by mouth daily as needed for pain.    [provider]  Multiple Vitamin (MULTIVITAMIN WITH MINERALS) TABS tablet Take 1 tablet by mouth daily.    [provider]    Family History Family History  Problem Relation Age of Onset   Breast cancer Maternal Grandmother    Hypertension Maternal Grandmother    Cancer Maternal Grandmother        breast   Hypertension Mother    Heart disease Mother    Hypertension Paternal Grandmother    Diabetes Paternal Grandmother    Diabetes Father    Hearing loss Neg Hx    Colon cancer Neg Hx    Stomach cancer Neg Hx    Liver disease Neg Hx    Pancreatic cancer Neg  Hx    Esophageal cancer Neg Hx     Social History Social History   Tobacco Use   Smoking status: Never   Smokeless tobacco: Never  Vaping Use   Vaping Use: Never used  Substance Use Topics   Alcohol use: No    Comment: ocassionally   Drug use: No     Allergies   Patient has no known allergies.   Review of Systems Review of Systems   Physical Exam Triage Vital Signs ED Triage Vitals [11/09/22 1753]  Enc Vitals Group     BP 138/86     Pulse Rate 73     Resp 18     Temp 98.2 F (36.8 C)     Temp Source Oral     SpO2 98 %     Weight      Height      Head Circumference      Peak Flow      Pain Score 2     Pain Loc      Pain Edu?      Excl. in GC?    No data found.  Updated Vital Signs BP 138/86 (BP Location: Left Arm)   Pulse 73   Temp 98.2 F (36.8 C) (Oral)   Resp 18   LMP 10/14/2022 (Approximate)   SpO2 98%   Visual Acuity Right Eye Distance:   Left Eye Distance:   Bilateral  Distance:    Right Eye Near:   Left Eye Near:    Bilateral Near:     Physical Exam Vitals reviewed.  Constitutional:      General: She is not in acute distress.    Appearance: She is not ill-appearing, toxic-appearing or diaphoretic.  HENT:     Mouth/Throat:     Mouth: Mucous membranes are moist.  Eyes:     Extraocular Movements: Extraocular movements intact.     Conjunctiva/sclera: Conjunctivae normal.     Pupils: Pupils are equal, round, and reactive to light.  Cardiovascular:     Rate and Rhythm: Normal rate and regular rhythm.     Heart sounds: No murmur heard. Pulmonary:     Breath sounds: Normal breath sounds.  Abdominal:     General: There is no distension.     Palpations: Abdomen is soft.     Tenderness: There is abdominal tenderness (suprapubic).  Skin:    Coloration: Skin is not jaundiced or pale.  Neurological:     General: No focal deficit present.     Mental Status: She is alert and oriented to person, place, and time.  Psychiatric:        Behavior: Behavior normal.      UC Treatments / Results  Labs (all labs ordered are listed, but only abnormal results are displayed) Labs Reviewed  POCT URINALYSIS DIP (MANUAL ENTRY) - Abnormal; Notable for the following components:      Result Value   Color, UA brown (*)    Clarity, UA cloudy (*)    Spec Grav, UA >=1.030 (*)    Blood, UA trace-intact (*)    Protein Ur, POC =30 (*)    Nitrite, UA Positive (*)    Leukocytes, UA Small (1+) (*)    All other components within normal limits  URINE CULTURE    EKG   Radiology No results found.  Procedures Procedures (including critical care time)  Medications Ordered in UC Medications - No data to display  Initial Impression / Assessment and Plan /  UC Course  I have reviewed the triage vital signs and the nursing notes.  Pertinent labs & imaging results that were available during my care of the patient were reviewed by me and considered in my medical  decision making (see chart for details).        There are nitrites and leuks in the urinalysis.  Culture is sent.  Cipro was sent for the cystitis and Pyridium was sent for the burning.  We will notify her if it looks like the antibiotic needs to be changed according to the culture. Final Clinical Impressions(s) / UC Diagnoses   Final diagnoses:  Cystitis     Discharge Instructions      The urine had some nitrites and white blood cells.  This can be a sign of a bladder infection.  Take Cipro 500 mg--1 tablet 2 times daily for 7 days  Take Pyridium/phenazopyridine 100 mg--1 tablet 3 times daily as needed for urinary pain.  This medication usually makes the urine orange  Drink plenty of water  Urine culture is sent in, and if it looks like the antibiotic needs to be changed, staff will call you.     ED Prescriptions     Medication Sig Dispense Auth. Provider   ciprofloxacin (CIPRO) 500 MG tablet Take 1 tablet (500 mg total) by mouth 2 (two) times daily for 7 days. 14 tablet Saylah Ketner, Janace Aris, MD   phenazopyridine (PYRIDIUM) 100 MG tablet Take 1 tablet (100 mg total) by mouth 3 (three) times daily as needed (urinary pain). 10 tablet Marlinda Mike Janace Aris, MD      PDMP not reviewed this encounter.   Zenia Resides, MD 11/09/22 [phone]

## 2022-11-10 LAB — URINE CULTURE

## 2022-11-11 LAB — URINE CULTURE: Culture: >=100000 — AB

## 2022-12-21 ENCOUNTER — Ambulatory Visit: Payer: Medicaid Other

## 2022-12-30 ENCOUNTER — Ambulatory Visit: Payer: Medicaid Other

## 2023-01-05 ENCOUNTER — Ambulatory Visit: Payer: Medicaid Other

## 2024-01-08 ENCOUNTER — Emergency Department (HOSPITAL_COMMUNITY)

## 2024-01-08 ENCOUNTER — Other Ambulatory Visit: Payer: Self-pay

## 2024-01-08 ENCOUNTER — Emergency Department (HOSPITAL_COMMUNITY)
Admission: EM | Admit: 2024-01-08 | Discharge: 2024-01-08 | Discharge status: Against Medical Advice | Attending: Emergency Medicine | Admitting: Emergency Medicine

## 2024-01-08 DIAGNOSIS — S6992XA Unspecified injury of left wrist, hand and finger(s), initial encounter: Secondary | ICD-10-CM | POA: Insufficient documentation

## 2024-01-08 DIAGNOSIS — Z5321 Procedure and treatment not carried out due to patient leaving prior to being seen by health care provider: Secondary | ICD-10-CM | POA: Diagnosis not present
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
## 2024-01-08 NOTE — ED Notes (Signed)
 Pt presents to staff and states that she is leaving and wishes to follow up at Urgent Care later today. Pt encouraged to stay but adamant that she leave. Pt is alert and oriented x 4 and ambulatory without assistance. Pt encouraged to return to ED should condition worsen
